# Patient Record
Sex: Female | Born: 1935 | Race: White | Hispanic: No | Smoking: Former smoker
Health system: Southern US, Community
[De-identification: ages and names within clinical notes are randomized; demographics above are authoritative.]

## PROBLEM LIST (undated history)

## (undated) DIAGNOSIS — C801 Malignant (primary) neoplasm, unspecified: Secondary | ICD-10-CM

## (undated) DIAGNOSIS — L409 Psoriasis, unspecified: Secondary | ICD-10-CM

## (undated) DIAGNOSIS — M1990 Unspecified osteoarthritis, unspecified site: Secondary | ICD-10-CM

## (undated) DIAGNOSIS — I10 Essential (primary) hypertension: Secondary | ICD-10-CM

## (undated) DIAGNOSIS — E785 Hyperlipidemia, unspecified: Secondary | ICD-10-CM

## (undated) DIAGNOSIS — T782XXA Anaphylactic shock, unspecified, initial encounter: Secondary | ICD-10-CM

## (undated) DIAGNOSIS — S8290XA Unspecified fracture of unspecified lower leg, initial encounter for closed fracture: Secondary | ICD-10-CM

## (undated) DIAGNOSIS — R55 Syncope and collapse: Secondary | ICD-10-CM

## (undated) DIAGNOSIS — M542 Cervicalgia: Secondary | ICD-10-CM

## (undated) DIAGNOSIS — M533 Sacrococcygeal disorders, not elsewhere classified: Secondary | ICD-10-CM

## (undated) DIAGNOSIS — G8929 Other chronic pain: Secondary | ICD-10-CM

## (undated) DIAGNOSIS — Z0000 Encounter for general adult medical examination without abnormal findings: Secondary | ICD-10-CM

## (undated) DIAGNOSIS — L210 Seborrhea capitis: Secondary | ICD-10-CM

## (undated) DIAGNOSIS — B019 Varicella without complication: Secondary | ICD-10-CM

## (undated) DIAGNOSIS — K56609 Unspecified intestinal obstruction, unspecified as to partial versus complete obstruction: Secondary | ICD-10-CM

## (undated) DIAGNOSIS — N390 Urinary tract infection, site not specified: Secondary | ICD-10-CM

## (undated) DIAGNOSIS — R0609 Other forms of dyspnea: Secondary | ICD-10-CM

## (undated) DIAGNOSIS — I319 Disease of pericardium, unspecified: Secondary | ICD-10-CM

## (undated) DIAGNOSIS — Z8619 Personal history of other infectious and parasitic diseases: Secondary | ICD-10-CM

## (undated) DIAGNOSIS — M549 Dorsalgia, unspecified: Secondary | ICD-10-CM

## (undated) DIAGNOSIS — E871 Hypo-osmolality and hyponatremia: Secondary | ICD-10-CM

## (undated) HISTORY — DX: Urinary tract infection, site not specified: N39.0

## (undated) HISTORY — DX: Encounter for general adult medical examination without abnormal findings: Z00.00

## (undated) HISTORY — DX: Unspecified fracture of unspecified lower leg, initial encounter for closed fracture: S82.90XA

## (undated) HISTORY — DX: Personal history of other infectious and parasitic diseases: Z86.19

## (undated) HISTORY — DX: Unspecified intestinal obstruction, unspecified as to partial versus complete obstruction: K56.609

## (undated) HISTORY — DX: Seborrhea capitis: L21.0

## (undated) HISTORY — DX: Syncope and collapse: R55

## (undated) HISTORY — PX: FEMUR SURGERY: SHX943

## (undated) HISTORY — DX: Hypo-osmolality and hyponatremia: E87.1

## (undated) HISTORY — DX: Cervicalgia: M54.2

## (undated) HISTORY — DX: Disease of pericardium, unspecified: I31.9

## (undated) HISTORY — PX: CATARACT EXTRACTION, BILATERAL: SHX1313

## (undated) HISTORY — DX: Psoriasis, unspecified: L40.9

## (undated) HISTORY — DX: Malignant (primary) neoplasm, unspecified: C80.1

## (undated) HISTORY — DX: Hyperlipidemia, unspecified: E78.5

## (undated) HISTORY — DX: Other forms of dyspnea: R06.09

## (undated) HISTORY — DX: Varicella without complication: B01.9

## (undated) HISTORY — PX: TUBAL LIGATION: SHX77

## (undated) HISTORY — DX: Anaphylactic shock, unspecified, initial encounter: T78.2XXA

## (undated) HISTORY — DX: Unspecified osteoarthritis, unspecified site: M19.90

## (undated) HISTORY — DX: Dorsalgia, unspecified: M54.9

## (undated) HISTORY — DX: Other chronic pain: G89.29

## (undated) HISTORY — DX: Essential (primary) hypertension: I10

## (undated) HISTORY — DX: Sacrococcygeal disorders, not elsewhere classified: M53.3

---
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
## 1977-02-04 HISTORY — PX: OTHER SURGICAL HISTORY: SHX169

## 1988-02-05 DIAGNOSIS — T782XXA Anaphylactic shock, unspecified, initial encounter: Secondary | ICD-10-CM

## 1988-02-05 HISTORY — PX: TOTAL KNEE ARTHROPLASTY: SHX125

## 1988-02-05 HISTORY — DX: Anaphylactic shock, unspecified, initial encounter: T78.2XXA

## 1994-02-04 DIAGNOSIS — S8290XA Unspecified fracture of unspecified lower leg, initial encounter for closed fracture: Secondary | ICD-10-CM

## 1994-02-04 HISTORY — DX: Unspecified fracture of unspecified lower leg, initial encounter for closed fracture: S82.90XA

## 1994-02-04 LAB — HM PAP SMEAR

## 2000-10-23 LAB — BASIC METABOLIC PANEL
Glucose: 93 mg/dL
Sodium: 139 mmol/L (ref 137–147)

## 2002-04-27 ENCOUNTER — Encounter: Payer: Self-pay | Admitting: Emergency Medicine

## 2002-04-27 ENCOUNTER — Inpatient Hospital Stay (HOSPITAL_COMMUNITY): Admission: EM | Admit: 2002-04-27 | Discharge: 2002-05-05 | Payer: Self-pay | Admitting: Diagnostic Radiology

## 2002-04-27 ENCOUNTER — Encounter: Payer: Self-pay | Admitting: Orthopedic Surgery

## 2002-04-29 ENCOUNTER — Encounter: Payer: Self-pay | Admitting: Orthopedic Surgery

## 2002-05-01 ENCOUNTER — Encounter: Payer: Self-pay | Admitting: Orthopedic Surgery

## 2002-05-02 ENCOUNTER — Encounter: Payer: Self-pay | Admitting: Orthopedic Surgery

## 2002-05-05 ENCOUNTER — Inpatient Hospital Stay (HOSPITAL_COMMUNITY)
Admission: RE | Admit: 2002-05-05 | Discharge: 2002-05-14 | Payer: Self-pay | Admitting: Physical Medicine & Rehabilitation

## 2002-05-06 ENCOUNTER — Encounter: Payer: Self-pay | Admitting: Physical Medicine & Rehabilitation

## 2007-09-01 ENCOUNTER — Encounter (INDEPENDENT_AMBULATORY_CARE_PROVIDER_SITE_OTHER): Payer: Self-pay | Admitting: General Surgery

## 2007-09-01 ENCOUNTER — Ambulatory Visit (HOSPITAL_COMMUNITY): Admission: RE | Admit: 2007-09-01 | Discharge: 2007-09-01 | Payer: Self-pay | Admitting: General Surgery

## 2007-11-09 LAB — BASIC METABOLIC PANEL
BUN: 10 mg/dL (ref 4–21)
Creatinine: 1 mg/dL (ref 0.5–1.1)
Glucose: 88 mg/dL
Sodium: 141 mmol/L (ref 137–147)

## 2009-06-01 LAB — BASIC METABOLIC PANEL
BUN: 11 mg/dL (ref 4–21)
Creatinine: 1 mg/dL (ref 0.5–1.1)
Potassium: 4.7 mmol/L (ref 3.4–5.3)

## 2009-06-01 LAB — HEPATIC FUNCTION PANEL
ALT: 18 U/L (ref 7–35)
Bilirubin, Total: 1.1 mg/dL

## 2009-06-01 LAB — TSH: TSH: 0.94 u[IU]/mL (ref 0.41–5.90)

## 2009-06-01 LAB — LIPID PANEL: Triglycerides: 103 mg/dL (ref 40–160)

## 2009-06-18 ENCOUNTER — Inpatient Hospital Stay (HOSPITAL_COMMUNITY)
Admission: EM | Admit: 2009-06-18 | Discharge: 2009-06-20 | Payer: Self-pay | Source: Home / Self Care | Admitting: Emergency Medicine

## 2009-06-19 ENCOUNTER — Encounter (INDEPENDENT_AMBULATORY_CARE_PROVIDER_SITE_OTHER): Payer: Self-pay | Admitting: Internal Medicine

## 2009-06-19 ENCOUNTER — Ambulatory Visit: Payer: Self-pay | Admitting: Vascular Surgery

## 2009-07-17 ENCOUNTER — Encounter (INDEPENDENT_AMBULATORY_CARE_PROVIDER_SITE_OTHER): Payer: Self-pay | Admitting: Internal Medicine

## 2010-04-23 LAB — LIPID PANEL
LDL Cholesterol: 125 mg/dL — ABNORMAL HIGH (ref 0–99)
VLDL: 20 mg/dL (ref 0–40)

## 2010-04-23 LAB — DIFFERENTIAL
Basophils Absolute: 0 10*3/uL (ref 0.0–0.1)
Eosinophils Absolute: 0 10*3/uL (ref 0.0–0.7)
Eosinophils Relative: 0 % (ref 0–5)
Lymphocytes Relative: 11 % — ABNORMAL LOW (ref 12–46)
Monocytes Absolute: 0.5 10*3/uL (ref 0.1–1.0)
Monocytes Relative: 8 % (ref 3–12)

## 2010-04-23 LAB — CBC
HCT: 38.2 % (ref 36.0–46.0)
HCT: 39.9 % (ref 36.0–46.0)
Hemoglobin: 13 g/dL (ref 12.0–15.0)
Hemoglobin: 14 g/dL (ref 12.0–15.0)
MCHC: 35 g/dL (ref 30.0–36.0)
Platelets: 282 10*3/uL (ref 150–400)
Platelets: 283 10*3/uL (ref 150–400)
RBC: 4.44 MIL/uL (ref 3.87–5.11)
RDW: 13.1 % (ref 11.5–15.5)
WBC: 5.2 10*3/uL (ref 4.0–10.5)

## 2010-04-23 LAB — CARDIAC PANEL(CRET KIN+CKTOT+MB+TROPI)
CK, MB: 1.7 ng/mL (ref 0.3–4.0)
Relative Index: INVALID (ref 0.0–2.5)
Relative Index: INVALID (ref 0.0–2.5)
Total CK: 57 U/L (ref 7–177)
Total CK: 62 U/L (ref 7–177)

## 2010-04-23 LAB — URINALYSIS, ROUTINE W REFLEX MICROSCOPIC
Glucose, UA: NEGATIVE mg/dL
Ketones, ur: NEGATIVE mg/dL
Protein, ur: NEGATIVE mg/dL
Specific Gravity, Urine: 1.009 (ref 1.005–1.030)
Urobilinogen, UA: 0.2 mg/dL (ref 0.0–1.0)

## 2010-04-23 LAB — BASIC METABOLIC PANEL
BUN: 12 mg/dL (ref 6–23)
CO2: 25 mEq/L (ref 19–32)
Calcium: 9.1 mg/dL (ref 8.4–10.5)
Calcium: 9.3 mg/dL (ref 8.4–10.5)
Chloride: 101 mEq/L (ref 96–112)
Chloride: 103 mEq/L (ref 96–112)
Creatinine, Ser: 0.8 mg/dL (ref 0.4–1.2)
GFR calc non Af Amer: >60 mL/min (ref >60)
GFR calc non Af Amer: >60 mL/min (ref >60)
Glucose, Bld: 105 mg/dL — ABNORMAL HIGH (ref 70–99)
Glucose, Bld: 99 mg/dL (ref 70–99)
Potassium: 4.5 mEq/L (ref 3.5–5.1)
Sodium: 137 mEq/L (ref 135–145)

## 2010-04-23 LAB — URINE MICROSCOPIC-ADD ON

## 2010-04-23 LAB — CK TOTAL AND CKMB (NOT AT ARMC)
CK, MB: 2.3 ng/mL (ref 0.3–4.0)
Relative Index: INVALID (ref 0.0–2.5)
Total CK: 63 U/L (ref 7–177)

## 2010-04-23 LAB — POCT CARDIAC MARKERS: Troponin i, poc: <0.05 ng/mL (ref 0.00–0.09)

## 2010-04-23 LAB — GLUCOSE, CAPILLARY: Glucose-Capillary: 96 mg/dL (ref 70–99)

## 2010-04-23 LAB — HEPATIC FUNCTION PANEL: AST: 24 U/L (ref 0–37)

## 2010-06-19 NOTE — Op Note (Signed)
NAME:  [name], [name]                   ACCOUNT NO.:  [id number]   MEDICAL RECORD NO.:  [id number]          PATIENT TYPE:  AMB   LOCATION:  SDS                          FACILITY:  [facility]   PHYSICIAN:  Ollen Gross. Vernell Morgans, M.D. DATE OF BIRTH:  [birth year]   DATE OF PROCEDURE:  09/01/2007  DATE OF DISCHARGE:  09/01/2007                               OPERATIVE REPORT   PREOPERATIVE DIAGNOSIS:  Umbilical hernia.   POSTOPERATIVE DIAGNOSIS:  Umbilical hernia.   PROCEDURE:  Umbilical hernia repair with mesh.   SURGEON:  Ollen Gross. Vernell Morgans, MD   ANESTHESIA:  General via LMA.   PROCEDURE:  After informed consent was obtained, the patient was brought  to the operating room, placed in the supine position on the operating  room table.  After adequate induction of general anesthesia, the  patient's abdomen was prepped with Betadine and draped in usual sterile  manner.  A transverse incision was made along the inferior edge of the  umbilicus with a #15 blade knife.  This incision was carried down  through the skin and subcutaneous tissue sharply with electrocautery  until the hernia was identified.  The hernia sac was opened sharply with  the electrocautery.  Only some omental fat was in the hernia sac and  this was able to easily be reduced.  The hernia sac was then ligated at  its base with the electrocautery.  Some circumferential dissection  around the fascial defect along the interface between the fascia and the  subcutaneous tissue was performed sharply with the electrocautery to  create a space for mesh.  The fascial defect was between a centimeter  and centimeter half in diameter.  The fascial defect was closed with  interrupted #1 Novafil  stitches.  A small piece of 3 x 6 mesh was then  chosen and the small piece of this was cut to cover the repair.  The  mesh was then anchored around and overlying the defect with interrupted  #1 Novafil stitches.  Once this was accomplished, the mesh was in  good  position without any tension.  The hernia was repaired and well covered  with the mesh.  Prior to starting the case, the area was completely  infiltrated with 0.25% Marcaine.  The wound was now irrigated with  copious amounts of saline.  The subcutaneous tissue was then closed over  the mesh with interrupted 3-0 Vicryl stitches, and the skin was closed  with running 4-0 Monocryl subcuticular stitch.  Dermabond dressing was  applied.  Once the Dermabond was dry, cotton balls and 4 x 4 gauze,  compression dressing were then applied.  The patient tolerated the  procedure well.  At the end of the case, all needle, sponge, and  instruments counts were correct.  The patient was then awakened and  taken to the recovery room in stable condition.      Ollen Gross. Vernell Morgans, M.D.  Electronically Signed     PST/MEDQ  D:  09/01/2007  T:  09/01/2007  Job:  [id number]

## 2010-06-22 NOTE — Op Note (Signed)
NAME:  [name], [name]                               ACCOUNT NO.:  [id number]   MEDICAL RECORD NO.:  [id number]                   PATIENT TYPE:  INP   LOCATION:  0474                                 FACILITY:  [facility]   PHYSICIAN:  Harvie Junior, M.D.                DATE OF BIRTH:  [birth year]   DATE OF PROCEDURE:  04/27/2002  DATE OF DISCHARGE:                                 OPERATIVE REPORT   PREOPERATIVE DIAGNOSIS:  Intertrochanteric fracture, left hip.   POSTOPERATIVE DIAGNOSIS:  Intertrochanteric fracture, left hip.   PROCEDURE:  Open reduction internal fixation of left intertrochanteric hip  fracture.   SURGEON:  Harvie Junior, M.D.   ASSISTANT:  Marshia Ly, P.A.   ANESTHESIA:  General.   BRIEF HISTORY:  She is a 77 year old female with long history of having a  fall.  She ultimately was seen in the emergency room by Devoria Albe, M.D. and  we were consulted for treatment of a left intertrochanteric hip fracture.  Was shown at that point to be a three part intertrochanteric hip fracture.  We talked about treatment options and ultimately felt that open reduction  internal fixation was the most appropriate course of action.  She was  brought to the operating room for this procedure.   PROCEDURE:  The patient was brought to the operating room.  After adequate  level of general anesthesia was obtained,  __________  the patient  positioned on the operating table.  Left hip was then prepped and draped in  the usual sterile fashion.  Following this, routine prep and drape of the  left hip was undertaken.  A lateral incision was made for lateral approach  to the hip.  Subcutaneous incision was taken down to the level of the tensor  fascia which was clearly divided in line with its fibers.  Attention was  then turned to the vastus lateralis which was divided in line with its  fibers.  First the vastus lateralis muscle tissue was then divided off the  posterior aspect of the vastus  lateralis fascia to the linea aspera.  The  muscle was then retracted anteriorly and the lateral aspect of the femur and  the fracture site were identified.  The guidewire was then advanced into the  central portion of the head on both the AP and lateral imaging and this was  done under direct fluoroscopic imaging.  Following this, this was overreamed  and a 95-mm screw was put in place into the central portion of the head on  both the AP and lateral fluoroscopy.  A four-hole, 135-degree sideplate was  then used and attached to the lateral aspect of the femur in standard AO  fashion.  Excellent fixation was achieved with all four screws.  Following  this, the fluoroscopy was used in AP and lateral to make sure that the  alignment positioning  was appropriate and once this was checked under  fluoroscopy, the wound was copiously irrigated and suctioned dry.  A set  screw was put in, the fracture compressed, and the set screw was removed.  Copious irrigation was undertaken.  The vastus lateralis was closed with 0  Vicryl interrupted suture.  The tensor fascia was closed with 1 Vicryl  interrupted suture.  The skin was then closed with 0 and 2-0 Vicryl and the  skin stapled.  Sterile __________  dressing was applied and the patient  taken to the recovery room.  She was noted to be in satisfactory condition.  Estimated blood loss for the procedure was 600 mL.                                              Harvie Junior, M.D.   Ranae Plumber  D:  04/27/2002  T:  04/27/2002  Job:  [id number]

## 2010-06-22 NOTE — Consult Note (Signed)
[name], [name]                               ACCOUNT NO.:  [id number]   MEDICAL RECORD NO.:  [id number]                   PATIENT TYPE:  INP   LOCATION:  0474                                 FACILITY:  [facility]   PHYSICIAN:  Petra Kuba, M.D.                 DATE OF BIRTH:  09/14/1924   DATE OF CONSULTATION:  05/01/2002  DATE OF DISCHARGE:                                   CONSULTATION   REPORT TITLE:  GASTROENTEROLOGY CONSULTATION.   REFERRING PHYSICIAN:  Harvie Junior, M.D.   HISTORY OF PRESENT ILLNESS:  The patient was seen at the request of Dr.  Luiz Blare for a postoperative ileus.  She really has not had much GI problems  before and no GI workup.  She has not had any previous colonic screening  who, after having her left intertrochanteric hip fracture repaired, has not  had a bowel movement.  She has had some increased nausea and pain and some  increased abdominal distention.  X-ray confirmed the ileus.  Her potassium  was 3 today.   PAST MEDICAL HISTORY:  1. Pertinent for some mild high blood pressure.  2. She has had an appendectomy.  3. Tubal ligation.  4. C-section.  5. She has also had some orthopedic surgeries.  6. No other GI problems.   ALLERGIES:  1. IBUPROFEN.  2. CODEINE.  3. VOLTAREN.   HOME MEDICATIONS:  1. Tylenol.  2. HCTZ.   MEDICATIONS IN HOSPITAL:  1. Coumadin.  2. HCTZ.  3. Percocet.  4. Darvocet.  5. Robaxin.  6. Antacids.  7. Dulcolax.  8. Fleets.  9. Phenergan.   SOCIAL HISTORY:  She does not smoke or drink.  Minimizes aspirin and  nonsteroidals.   FAMILY HISTORY:  Pertinent for some irritable bowel, but no obvious other GI  problems.   REVIEW OF SYSTEMS:  Pertinent that an enema and suppository really have not  been helpful and she does not like the NG tube.  She had been very active  prior to her injury.   PHYSICAL EXAMINATION:  GENERAL:  No acute distress, although clearly  unhappy.  ABDOMEN:  Pertinent for being minimally  tender, slightly distended, but some  occasional bowel sounds.  X-ray was reviewed pertinent for the ileus.   LABORATORY DATA:  Pertinent for normal white count.  BUN, creatinine,  magnesium, and calcium positive.  Trace __________.  Decreased potassium as  mentioned above.   ASSESSMENT:  Postoperative ileus and decreased potassium.   PLAN:  Will give her three rounds of potassium and increase the potassium in  her IV fluids.  Will try to decrease the pain medicine using more Darvocet  than Percocet and will try some Magnesium citrate via her NG tube and have  it planned for a few hours to see if that will help and also begin IV  Reglan.  Will  follow with you and check on tomorrow, checking potassium  tomorrow and see if she is not better.  At the first signs of worsening,  probably will repeat x-ray.  May also try more enemas and suppositories in  the future and possibly something like Zelnorm.                                               Petra Kuba, M.D.    MEM/MEDQ  D:  05/01/2002  T:  05/01/2002  Job:  [id number]   cc:   Harvie Junior, M.D.  [address]  [place]  [place] [address]  Fax: [phone]   Gretta Arab. Valentina Lucks, M.D.  [address] [place]  [place] [address]  Fax: [phone]

## 2010-06-22 NOTE — Discharge Summary (Signed)
[name], [name]                               ACCOUNT NO.:  [id number]   MEDICAL RECORD NO.:  [id number]                   PATIENT TYPE:  INP   LOCATION:  0474                                 FACILITY:  [facility]   PHYSICIAN:  Harvie Junior, M.D.                DATE OF BIRTH:  October 21, 1924   DATE OF ADMISSION:  04/27/2002  DATE OF DISCHARGE:  05/05/2002                                 DISCHARGE SUMMARY   ADMISSION DIAGNOSES:  1. Displaced intertrochanteric fracture left hip.  2. Hypertension.  3. Status post bilateral total knee replacements.   DISCHARGE DIAGNOSES:  1. Displaced intertrochanteric fracture left hip.  2. Hypertension.  3. Status post bilateral total knee replacements.  4. Hypokalemia.  5. Postoperative ileus.   CONSULTATIONS IN THE HOSPITAL:  Dr. Petra Kuba, gastroenterology.   PROCEDURES IN THE HOSPITAL:  Open reduction and internal fixation left hip,  Jodi Geralds, M.D., April 27, 2002.   BRIEF HISTORY:  The patient is a 77 year old female who fell at home as she  was going out her outdoor steps.  She had onset of left hip pain, inability  to weight bear.  Her left leg was shortened, and her foot was externally  rotated.  She was brought to [facility] Emergency Room via EMS.  Her x-rays showed a displaced left subtrochanteric/intertrochanteric  fracture.  She was admitted for surgical treatment of this significant left  hip injury.   PERTINENT LABORATORY STUDIES:  Hemoglobin on admission was 14.2.  On  postoperative evening of day of surgery her hemoglobin was 12.1.  On  postoperative day #1 her hemoglobin was 10.8, #2 was 10.3, #3 was 10, #4 was  11.7.  She was followed until the date of discharge on May 05, 2002, when  her hemoglobin was 9.5.  Indices were within normal limits on the date of  admission.  Protime on admission was 12.1 seconds, with an INR of 0.8, PTT  was 29.  She was on Coumadin therapy and was noted to have an elevated  protime on the date of discharge of 32.8 with an INR of 4.2.  The CMET on  admission was within normal limits other than elevated glucose of 119.  Her  potassium was 4.  On postoperative day #4 the potassium was 3.  This was  done the following day and was 4.5 and on the day of discharge was 3.3.  A  urinalysis on admission showed no abnormalities.   EKG on admission showed normal sinus rhythm with first-degree AV block with  occasional premature supraventricular complexes.   Preoperative x-rays of the left hip showed an acute intertrochanteric  fracture of the left hip.  There was no evidence of pelvic fracture.  Chest  x-ray showed mild cardiomegaly with no active disease.  Postoperative x-ray  of the left hip showed status post ORIF left hip  with placement of dynamic  hip screw and lateral femoral plate across the comminuted intertrochanteric  fracture.  No hardware complications were noted.  X-rays of the abdomen on  May 01, 2002, showed findings most consistent with ileus.  X-ray on May 02, 2002, of the abdomen showed interval placement of a nasogastric tube  with persistent changes of ileus.   HOSPITAL COURSE:  The patient was admitted to [facility] through the  emergency room and was brought to the operating room where she underwent  ORIF of her left hip by Dr. Luiz Blare.  Postoperatively she was placed on  Coumadin antithrombolytic therapy per pharmacy protocol.  She was put on  Ancef 1 g IV q.8h. x6 doses.  Physical therapy was ordered for walker  ambulation, 50% weightbearing on the left.  On postoperative day #1 she was  doing well.  She had some nervousness.  She initially was taking fluids  without difficulty.  Her Foley catheter which had been placed at the time of  surgery was drainage well.  Her potassium was 4.  Hemoglobin was 10.8.  INR  was 1.  She was gotten out of bed to the chair with physical therapy,  partial weightbearing on the left side.  On postoperative  day #2 she had  some pain and the sensation of bones moving in her left hip.  Repeat x-  rays were taken which were unremarkable and showed excellent fixation of her  left hip fracture.  A rehabilitation consult was obtained.  She was felt to  be an excellent candidate for inpatient rehabilitation.  On postoperative  day #3 she complained of gassiness in her stomach.  She was taking fluids  without difficulty but was unable to take solids.  She made slow progress  with physical therapy.  She had not had flatus or a bowel movement at that  point.  Hemoglobin was 10.  INR was 1.3.  She did have hypoactive bowel  sounds.  A Dulcolax suppository was ordered and her Foley catheter  discontinued, and her diet was decreased to clear liquids.  She had  worsening abdominal pain and cramping and distention with some nausea.  A  KUB x-ray confirmed a postoperative ileus.  Her pain medications were  decreased, and a GI consult was obtained by Vida Rigger, M.D.  He suggested  an enema, decreasing her IV fluids, and increasing her Reglan.  She had  significant abdominal pain and distention.  She was kept n.p.o.  She was out  of bed, ambulating with a walker.  Her vital signs were stable.  She was  afebrile.  Her left hip dressing was changed, and her hip wound was benign.  NV was intact to her left lower extremity.  Her potassium was 4.1.  Hemoglobin was 10.6.  Her INR was 4.  Her Coumadin was held as this was  managed per the hospital pharmacy.  She was then beginning to pass gas on  May 03, 2002, postoperative day #6.  Her NG tube was discontinued.  MiraLax was continued per Dr. Ewing Schlein.  She was felt to be an excellent  candidate for inpatient rehabilitation.  Her NG tube was removed.  She was  then passing gas without difficulty, had no more nausea and vomiting.  Her  bowel sounds were stable, and she was afebrile.  A clear-liquid diet was started.  She did have a bowel movement on postoperative day  #7.  She had  much less abdominal pain.  Her INR was 4.3, and her protime was 33.2  seconds.  Her Coumadin continued to be held.  She was felt to then be an  excellent candidate for the inpatient rehabilitation floor at [facility].  She was transferred there on May 05, 2002, in improved condition.   DIET ON DISCHARGE:  Clear-liquid diet, progressing to a soft mechanical  diet, and then regular as tolerated.   MEDICATIONS ON DISCHARGE:  1. Hydrochlorothiazide.  2. Coumadin per pharmacy protocol.  3. Darvocet-N 100 p.r.n. for pain.    ACTIVITY STATUS:  Weightbearing, 50% on the left with a walker, with  physical therapy.   FOLLOW-UP:  We will follow her while she is on the inpatient unit at [facility].     Marshia Ly, P.A.                       Harvie Junior, M.D.    Cordelia Pen  D:  06/11/2002  T:  06/12/2002  Job:  [id number]   cc:   Gretta Arab. Valentina Lucks, M.D.  [address] [place]  [place] [address]  Fax: [phone]   Petra Kuba, M.D.  [address]  [place]  [place] [address]  Fax: [phone]

## 2010-06-22 NOTE — Discharge Summary (Signed)
[name], [name] NO.:  [id number]   MEDICAL RECORD NO.:  [id number]                   PATIENT TYPE:  IPS   LOCATION:  4149                                 FACILITY:  [facility]   PHYSICIAN:  Ranelle Oyster, M.D.             DATE OF BIRTH:  1924-05-08   DATE OF ADMISSION:  05/05/2002  DATE OF DISCHARGE:  05/14/2002                                 DISCHARGE SUMMARY   DISCHARGE DIAGNOSES:  1. Left intertrochanteric hip fracture.  2. Ileus, resolved.  3. History of hypertension.  4. Urinary tract infection.   HISTORY OF PRESENT ILLNESS:  The patient is a 77 year old white female  admitted at [facility] ER on April 27, 2002 for evaluation of left hip pain  after a fall.  X-ray revealed IT fracture of the left hip, no pelvic  fracture.  The patient underwent ORIF of the left hip by Dr. Harvie Junior.  The patient is presently on Coumadin for DVT prophylaxis.  Postoperative  complications include ileus and anemia.  The patient has been followed by  GI, Dr. Petra Kuba.  PT report at this time indicated that the patient is  ambulating with close supervision, 50 feet with rolling walker and min-  assist level for obtaining transfers and bed mobility.  The patient was  transferred to [facility] Department on May 05, 2002.   PAST MEDICAL HISTORY:  Past medical history is significant for hypertension,  pericarditis, chronic bronchitis.  Denies diabetes, DVT, CVA or  cardiovascular disease.   PAST SURGICAL HISTORY:  Past surgical history is significant for bilateral  TKRs in 1990, appendectomy, back surgery, tubal ligation.   MEDICATIONS PRIOR TO ADMISSION:  1. Tylenol.  2. Hydrochlorothiazide 25 mg daily.  3. Centrum Silver.   PRIMARY CARE Mariaguadalupe Fialkowski:  Dr. Consuella Lose C. Griffin.   SOCIAL HISTORY:  The patient lives at home in a one-level home in  [place] with three to four steps at entry.  She was independent prior to  admission.  She quit smoking five years ago.  She plans to live with son,  who is unemployed, after discharge and son is able to provide assistance.   REVIEW OF SYSTEMS:  Denies any chest pain, shortness of breath, or nausea or  vomiting.   ALLERGIES:  IBUPROFEN.   HOSPITAL COURSE:  The patient was admitted to [facility] Rehab  Department on May 05, 2002 for comprehensive inpatient rehabilitation  where she received more than three hours of physical therapy daily.  Overall, she made fairly good progress during her hospital stay.  Hospital  course was significant for elevated INR, anemia and UTI.  The patient was  able to maintain her partial weightbearing status and able to tolerate  therapies.  She was discharged from rehab at a modified independent level,  ambulating approximately 150 feet.  She remained on  Coumadin for most of her  stay until INR was greater than 3.  On May 05, 2002, Coumadin was held x3  days due to INR greater than 3.  INR was probably elevated due to the  patient's nothing-by-mouth status secondary to ileus.  The patient began to  take foods in slowly and had no problems.  Ileus did resolve.  A urine  culture was performed on May 10, 2002 which revealed greater than 100,000  colonies of a group B beta strep, therefore, she was started on amoxicillin  250 mg p.o. b.i.d. for seven days.  Staples were discontinued from the  surgical incision on May 10, 2002 and Steri-Strips were applied.  Hip  incision appeared to be well-healed with no signs of infection.  As noted,  on May 06, 2002, her diet was advanced to a regular diet and able to  tolerate very well.  She remained on Trinsicon one tablet p.o. b.i.d. for  hemoglobin; she had an admission hemoglobin of 9.8.  There were no other  major issues that occurred while in rehab.   Latest labs reveal hemoglobin of 9.8, hematocrit 29.5, white blood cell  count was 4.4, platelet count 436,000; latest INR  was 2.6 and latest PT was  24.5; latest sodium 136, potassium 3.6, chloride 100, CO2 28, glucose 106,  BUN 10, creatinine 1, AST 51, ALT 52.  She did have repeat _______ on  May 06, 2002 and it still demonstrated it was consistent with a  nonobstructive ileus and the patient was asymptomatic.  At the time of  discharge, all vitals were fairly stable.  PT report indicated the patient  was ambulating approximately modified independently 150 feet with rolling  walker, could transfer sit to stand, modified independently, bed mobility,  modified independently.  She could perform most ADLs with supervision,  modified independently.  She is at partial weightbearing status.  The  patient was discharged home with her family.   DISCHARGE MEDICATIONS:  1. Hydrochlorothiazide 25 mg daily.  2. Coumadin 2.5 mg one tablet in the p.m. until May 28, 2002.  3. Multivitamin one tablet daily.  4. Oxycodone 5 to 10 mg every four to six hours as needed for pain.  5. Amoxicillin 250 mg one tablet three times a day for seven days.  6. She takes calcium plus D over the counter once daily.   SPECIAL DISCHARGE INSTRUCTIONS:  No aspirin, ibuprofen or Aleve while on  Coumadin.   PAIN MANAGEMENT:  Pain management includes oxycodone and Tylenol.   ACTIVITY:  No driving or drinking alcohol.  Use walker.  She will have  [facility] for PT, OT and INR, and to be monitored by R.N.; first  draw will be Monday for PT and INR.    FOLLOWUP:  Follow up with Dr. Luiz Blare in two weeks; call for appointment.  Follow up with her primary care Nicholes Hibler within four to six weeks.  Follow  up with Dr. Ranelle Oyster as needed.     Junie Bame, P.A.                       Ranelle Oyster, M.D.    LH/MEDQ  D:  06/17/2002  T:  06/19/2002  Job:  [id number]   cc:   Gretta Arab. Valentina Lucks, M.D.  [address]  [place] [address]  Fax: [phone]   Harvie Junior, M.D.  [address]  [place]  [place] [address]  Fax: [phone]

## 2010-11-02 LAB — CBC
HCT: 44.3
Hemoglobin: 14.9
MCHC: 33.5
RBC: 4.86
RDW: 12.9
WBC: 5.8

## 2010-11-02 LAB — COMPREHENSIVE METABOLIC PANEL
AST: 30
Albumin: 4.1
GFR calc Af Amer: 60 — ABNORMAL LOW
Glucose, Bld: 90
Potassium: 4.1
Total Bilirubin: 1

## 2010-11-02 LAB — DIFFERENTIAL
Basophils Absolute: 0
Eosinophils Relative: 2
Lymphocytes Relative: 18
Lymphs Abs: 1
Monocytes Absolute: 0.5
Neutro Abs: 4.2

## 2011-05-16 ENCOUNTER — Encounter: Payer: Self-pay | Admitting: Family Medicine

## 2011-05-16 ENCOUNTER — Ambulatory Visit (INDEPENDENT_AMBULATORY_CARE_PROVIDER_SITE_OTHER): Payer: Medicare Other | Admitting: Family Medicine

## 2011-05-16 VITALS — BP 147/85 | HR 75 | Temp 98.1°F | Ht 65.5 in | Wt 189.0 lb

## 2011-05-16 DIAGNOSIS — R350 Frequency of micturition: Secondary | ICD-10-CM

## 2011-05-16 DIAGNOSIS — R5383 Other fatigue: Secondary | ICD-10-CM

## 2011-05-16 DIAGNOSIS — R0609 Other forms of dyspnea: Secondary | ICD-10-CM

## 2011-05-16 DIAGNOSIS — G8929 Other chronic pain: Secondary | ICD-10-CM | POA: Insufficient documentation

## 2011-05-16 DIAGNOSIS — L821 Other seborrheic keratosis: Secondary | ICD-10-CM | POA: Insufficient documentation

## 2011-05-16 DIAGNOSIS — L409 Psoriasis, unspecified: Secondary | ICD-10-CM | POA: Insufficient documentation

## 2011-05-16 DIAGNOSIS — M549 Dorsalgia, unspecified: Secondary | ICD-10-CM

## 2011-05-16 DIAGNOSIS — L719 Rosacea, unspecified: Secondary | ICD-10-CM

## 2011-05-16 DIAGNOSIS — I10 Essential (primary) hypertension: Secondary | ICD-10-CM | POA: Insufficient documentation

## 2011-05-16 DIAGNOSIS — R5381 Other malaise: Secondary | ICD-10-CM

## 2011-05-16 DIAGNOSIS — R0600 Dyspnea, unspecified: Secondary | ICD-10-CM

## 2011-05-16 DIAGNOSIS — M1990 Unspecified osteoarthritis, unspecified site: Secondary | ICD-10-CM | POA: Insufficient documentation

## 2011-05-16 DIAGNOSIS — L210 Seborrhea capitis: Secondary | ICD-10-CM | POA: Insufficient documentation

## 2011-05-16 DIAGNOSIS — R0989 Other specified symptoms and signs involving the circulatory and respiratory systems: Secondary | ICD-10-CM

## 2011-05-16 DIAGNOSIS — Z8619 Personal history of other infectious and parasitic diseases: Secondary | ICD-10-CM | POA: Insufficient documentation

## 2011-05-16 DIAGNOSIS — Z0000 Encounter for general adult medical examination without abnormal findings: Secondary | ICD-10-CM

## 2011-05-16 DIAGNOSIS — M542 Cervicalgia: Secondary | ICD-10-CM | POA: Insufficient documentation

## 2011-05-16 DIAGNOSIS — Z23 Encounter for immunization: Secondary | ICD-10-CM

## 2011-05-16 DIAGNOSIS — I319 Disease of pericardium, unspecified: Secondary | ICD-10-CM

## 2011-05-16 HISTORY — DX: Dyspnea, unspecified: R06.00

## 2011-05-16 HISTORY — DX: Dorsalgia, unspecified: M54.9

## 2011-05-16 HISTORY — DX: Other forms of dyspnea: R06.09

## 2011-05-16 HISTORY — DX: Encounter for general adult medical examination without abnormal findings: Z00.00

## 2011-05-16 LAB — POCT URINALYSIS DIPSTICK
Bilirubin, UA: NEGATIVE
Blood, UA: NEGATIVE
Glucose, UA: NEGATIVE
Nitrite, UA: NEGATIVE
Spec Grav, UA: 1.015
Urobilinogen, UA: 0.2

## 2011-05-16 LAB — HEPATIC FUNCTION PANEL
Alkaline Phosphatase: 55 U/L (ref 39–117)
Bilirubin, Direct: 0.1 mg/dL (ref 0.0–0.3)
Total Bilirubin: 0.6 mg/dL (ref 0.3–1.2)

## 2011-05-16 LAB — RENAL FUNCTION PANEL
Albumin: 4.4 g/dL (ref 3.5–5.2)
Chloride: 107 mEq/L (ref 96–112)
GFR: 54.21 mL/min — ABNORMAL LOW (ref >60.00)
Glucose, Bld: 99 mg/dL (ref 70–99)
Phosphorus: 3.2 mg/dL (ref 2.3–4.6)
Potassium: 4.6 mEq/L (ref 3.5–5.1)
Sodium: 142 mEq/L (ref 135–145)

## 2011-05-16 LAB — CBC
HCT: 44.3 % (ref 36.0–46.0)
Hemoglobin: 14.4 g/dL (ref 12.0–15.0)
MCV: 91.8 fl (ref 78.0–100.0)
Platelets: 239 10*3/uL (ref 150.0–400.0)
RBC: 4.83 Mil/uL (ref 3.87–5.11)
WBC: 7 10*3/uL (ref 4.5–10.5)

## 2011-05-16 MED ORDER — DOXYCYCLINE HYCLATE 50 MG PO CAPS: 50.0000 mg | ORAL_CAPSULE | ORAL | Status: DC

## 2011-05-16 MED ORDER — AMLODIPINE BESYLATE 5 MG PO TABS: 5.0000 mg | ORAL_TABLET | Freq: Every day | ORAL | Status: AC

## 2011-05-16 NOTE — Progress Notes (Signed)
Patient ID: [name], female   DOB: February 08, 1924, 78 y.o.   MRN: [id number] [name] [id number] 1924-07-16 05/16/2011      Progress Note New Patient  Subjective  Chief Complaint  Chief Complaint  Patient presents with  . Establish Care    new patient    HPI  Patient is an 78 year old Caucasian female who is in today for an new patient appointment accompanied by her son. He lives with her aunt is her personal caretaker. Overall she does well. She complains of mild memory loss but is drinking every day and realizes is minimal. She's been in recent good health. They're concerned about some symptoms began about 6 months ago. He used to walk daily and then about 6 months ago while she was out walking for a sudden onset of some back pain dyspnea and fatigue and has refused while walking with her son ever since. She now intermittently has some mid back pain but it is not always associated with dyspnea or with exertion. Sometimes changing position to resolve it. When she exerts herself she does note she's had increased dyspnea and increased fatigability. They deny any acute illness, fever, congestion, palpitations, GI or GU complaints. They're making this a try practice secondary to the convenience of being closer to home  Past Medical History  Diagnosis Date  . Psoriasis     scalp  . Cancer     basal cells on back, shoulder and nose  . Chicken pox as a child  . Mumps as a child  . Measles as a child  . Hypertension   . Fainting spell     X 1 time, at home in hospital 3 days  . Broken leg 1996  . Arthritis     cervical, spurs  . Neck pain, chronic   . History of shingles   . Seborrhea capitis   . Seborrheic keratosis   . Pericarditis   . Back pain 05/16/2011  . DOE (dyspnea on exertion) 05/16/2011  . Preventative health care 05/16/2011    Past Surgical History  Procedure Date  . [place]acarditice 1981    [place]  . Back disc repair 1979    low back with LLE radiculopathy  . Double  knee replacement 1990  . Anaphalactic shock 1990    due to drug Voltaren  . Breast lumpectomy 1947    left  . Cataract extraction, bilateral   . Tubal ligation     Family History  Problem Relation Age of Onset  . Cancer Mother     ovarian  . Cancer Father     pancreatic  . Heart disease Son   . Diabetes Son     type 2  . COPD Son     smoked  . Emphysema Son     History   Social History  . Marital Status: Widowed    Spouse Name: N/A    Number of Children: N/A  . Years of Education: N/A   Occupational History  . Not on file.   Social History Main Topics  . Smoking status: Former Smoker -- 41 years    Types: Cigarettes    Quit date: 02/05/1996  . Smokeless tobacco: Never Used  . Alcohol Use: No  . Drug Use: No  . Sexually Active: No   Other Topics Concern  . Not on file   Social History Narrative  . No narrative on file    Current Outpatient Prescriptions on File Prior to Visit  Medication Sig Dispense Refill  . amLODipine (NORVASC) 5 MG tablet Take 1 tablet (5 mg total) by mouth daily.  90 tablet  3    Allergies  Allergen Reactions  . Ibuprofen Anaphylaxis  . Lisinopril     Fall hazard, dizzy  . Voltaren Anaphylaxis  . Adhesive (Tape)   . Codeine Nausea Only    Review of Systems  Review of Systems  Constitutional: Positive for malaise/fatigue. Negative for fever and chills.  HENT: Positive for neck pain. Negative for hearing loss, nosebleeds and congestion.   Eyes: Negative for discharge.  Respiratory: Negative for cough, sputum production, shortness of breath and wheezing.   Cardiovascular: Negative for chest pain, palpitations and leg swelling.       DOE  Gastrointestinal: Negative for heartburn, nausea, vomiting, abdominal pain, diarrhea, constipation and blood in stool.  Genitourinary: Negative for dysuria, urgency, frequency and hematuria.  Musculoskeletal: Positive for back pain. Negative for myalgias and falls.  Skin: Negative for  rash.  Neurological: Negative for dizziness, tremors, sensory change, focal weakness, loss of consciousness, weakness and headaches.  Endo/Heme/Allergies: Negative for polydipsia. Does not bruise/bleed easily.  Psychiatric/Behavioral: Negative for depression and suicidal ideas. The patient is not nervous/anxious and does not have insomnia.     Objective  BP 147/85  Pulse 75  Temp(Src) 98.1 F (36.7 C) (Temporal)  Ht 5' 5.5" (1.664 m)  Wt 189 lb (85.73 kg)  BMI 30.97 kg/m2  SpO2 99%  Physical Exam  Physical Exam  Constitutional: She is oriented to person, place, and time and well-developed, well-nourished, and in no distress. No distress.  HENT:  Head: Normocephalic and atraumatic.  Right Ear: External ear normal.  Left Ear: External ear normal.  Nose: Nose normal.  Mouth/Throat: Oropharynx is clear and moist. No oropharyngeal exudate.  Eyes: Conjunctivae are normal. Pupils are equal, round, and reactive to light. Right eye exhibits no discharge. Left eye exhibits no discharge. No scleral icterus.  Neck: Normal range of motion. Neck supple. No thyromegaly present.  Cardiovascular: Normal rate, regular rhythm and intact distal pulses.   Murmur heard.      1/6 sys murmur  Pulmonary/Chest: Effort normal and breath sounds normal. No respiratory distress. She has no wheezes. She has no rales.  Abdominal: Soft. Bowel sounds are normal. She exhibits no distension and no mass. There is no tenderness.  Musculoskeletal: Normal range of motion. She exhibits no edema and no tenderness.  Lymphadenopathy:    She has no cervical adenopathy.  Neurological: She is alert and oriented to person, place, and time. She has normal reflexes. No cranial nerve deficit. Coordination normal.  Skin: Skin is warm and dry. No rash noted. She is not diaphoretic.  Psychiatric: Mood, memory and affect normal.       Assessment & Plan  Pericarditis No recurrence, responded to  antibiotics  Hypertension Mildly elevate today but patient very anxious due to first visit here. Will reassess at next visit is given a refill on her Norvasc today  Preventative health care Switching to our practice because it is closer to home. Given Tdap today, old records requested.  History of shingles No recurrence, has not taken the Zostavax  Seborrheic keratosis Many lesions of various sizes, they are interested in having the ones that get irritated and caught on clothes removed but they are warned that they often regrow and the healing does not always go smoothly. They will consider referral in the future  Back pain She is accompanied by her son  and they correlate the onset of mid back pain which is intermittent with the onset of dyspnea with exertion dating back about 6 months. She reports the back pain is not always associated with exertion sometimes occurs with sitting. When that is the case change of position will often help. Other times the pain is associated with exertion and rest is the only thing that helps  DOE (dyspnea on exertion) Her youngest son is with her today and lives with her. He is concerned because about 6 months ago she started complaining of fatigue and easy or dyspnea with exertion. Walking with him. He reports there was an episode where she became extremely diaphoretic while they were out walking and short of breath and since then she is refused to go out walking with him. We will refer to cardiology at this time for cardiac stress testing and manage accordingly once her cardiac status is evaluated  Seborrhea capitis They have seen 4 different dermatologists and have never been able to fully resolve this. At present they are using 2 OTC products and her symptoms are minimal. She uses Dermares and Psoriasin gel encouraged her to start a Krill oil supplement as well

## 2011-05-16 NOTE — Assessment & Plan Note (Signed)
Switching to our practice because it is closer to home. Given Tdap today, old records requested.

## 2011-05-16 NOTE — Patient Instructions (Signed)
Preventive Care for Adults, Female A healthy lifestyle and preventive care can promote health and wellness. Preventive health guidelines for women include the following key practices.  A routine yearly physical is a good way to check with your caregiver about your health and preventive screening. It is a chance to share any concerns and updates on your health, and to receive a thorough exam.   Visit your dentist for a routine exam and preventive care every 6 months. Brush your teeth twice a day and floss once a day. Good oral hygiene prevents tooth decay and gum disease.   The frequency of eye exams is based on your age, health, family medical history, use of contact lenses, and other factors. Follow your caregiver's recommendations for frequency of eye exams.   Eat a healthy diet. Foods like vegetables, fruits, whole grains, low-fat dairy products, and lean protein foods contain the nutrients you need without too many calories. Decrease your intake of foods high in solid fats, added sugars, and salt. Eat the right amount of calories for you.Get information about a proper diet from your caregiver, if necessary.   Regular physical exercise is one of the most important things you can do for your health. Most adults should get at least 150 minutes of moderate-intensity exercise (any activity that increases your heart rate and causes you to sweat) each week. In addition, most adults need muscle-strengthening exercises on 2 or more days a week.   Maintain a healthy weight. The body mass index (BMI) is a screening tool to identify possible weight problems. It provides an estimate of body fat based on height and weight. Your caregiver can help determine your BMI, and can help you achieve or maintain a healthy weight.For adults 20 years and older:   A BMI below 18.5 is considered underweight.   A BMI of 18.5 to 24.9 is normal.   A BMI of 25 to 29.9 is considered overweight.   A BMI of 30 and above is  considered obese.   Maintain normal blood lipids and cholesterol levels by exercising and minimizing your intake of saturated fat. Eat a balanced diet with plenty of fruit and vegetables. Blood tests for lipids and cholesterol should begin at age 20 and be repeated every 5 years. If your lipid or cholesterol levels are high, you are over 50, or you are at high risk for heart disease, you may need your cholesterol levels checked more frequently.Ongoing high lipid and cholesterol levels should be treated with medicines if diet and exercise are not effective.   If you smoke, find out from your caregiver how to quit. If you do not use tobacco, do not start.   If you are pregnant, do not drink alcohol. If you are breastfeeding, be very cautious about drinking alcohol. If you are not pregnant and choose to drink alcohol, do not exceed 1 drink per day. One drink is considered to be 12 ounces (355 mL) of beer, 5 ounces (148 mL) of wine, or 1.5 ounces (44 mL) of liquor.   Avoid use of street drugs. Do not share needles with anyone. Ask for help if you need support or instructions about stopping the use of drugs.   High blood pressure causes heart disease and increases the risk of stroke. Your blood pressure should be checked at least every 1 to 2 years. Ongoing high blood pressure should be treated with medicines if weight loss and exercise are not effective.   If you are 55 to 76   years old, ask your caregiver if you should take aspirin to prevent strokes.   Diabetes screening involves taking a blood sample to check your fasting blood sugar level. This should be done once every 3 years, after age 45, if you are within normal weight and without risk factors for diabetes. Testing should be considered at a younger age or be carried out more frequently if you are overweight and have at least 1 risk factor for diabetes.   Breast cancer screening is essential preventive care for women. You should practice "breast  self-awareness." This means understanding the normal appearance and feel of your breasts and may include breast self-examination. Any changes detected, no matter how small, should be reported to a caregiver. Women in their 20s and 30s should have a clinical breast exam (CBE) by a caregiver as part of a regular health exam every 1 to 3 years. After age 40, women should have a CBE every year. Starting at age 40, women should consider having a mammography (breast X-ray test) every year. Women who have a family history of breast cancer should talk to their caregiver about genetic screening. Women at a high risk of breast cancer should talk to their caregivers about having magnetic resonance imaging (MRI) and a mammography every year.   The Pap test is a screening test for cervical cancer. A Pap test can show cell changes on the cervix that might become cervical cancer if left untreated. A Pap test is a procedure in which cells are obtained and examined from the lower end of the uterus (cervix).   Women should have a Pap test starting at age 21.   Between ages 21 and 29, Pap tests should be repeated every 2 years.   Beginning at age 30, you should have a Pap test every 3 years as long as the past 3 Pap tests have been normal.   Some women have medical problems that increase the chance of getting cervical cancer. Talk to your caregiver about these problems. It is especially important to talk to your caregiver if a new problem develops soon after your last Pap test. In these cases, your caregiver may recommend more frequent screening and Pap tests.   The above recommendations are the same for women who have or have not gotten the vaccine for human papillomavirus (HPV).   If you had a hysterectomy for a problem that was not cancer or a condition that could lead to cancer, then you no longer need Pap tests. Even if you no longer need a Pap test, a regular exam is a good idea to make sure no other problems are  starting.   If you are between ages 65 and 70, and you have had normal Pap tests going back 10 years, you no longer need Pap tests. Even if you no longer need a Pap test, a regular exam is a good idea to make sure no other problems are starting.   If you have had past treatment for cervical cancer or a condition that could lead to cancer, you need Pap tests and screening for cancer for at least 20 years after your treatment.   If Pap tests have been discontinued, risk factors (such as a new sexual partner) need to be reassessed to determine if screening should be resumed.   The HPV test is an additional test that may be used for cervical cancer screening. The HPV test looks for the virus that can cause the cell changes on the cervix.   The cells collected during the Pap test can be tested for HPV. The HPV test could be used to screen women aged 30 years and older, and should be used in women of any age who have unclear Pap test results. After the age of 30, women should have HPV testing at the same frequency as a Pap test.   Colorectal cancer can be detected and often prevented. Most routine colorectal cancer screening begins at the age of 50 and continues through age 75. However, your caregiver may recommend screening at an earlier age if you have risk factors for colon cancer. On a yearly basis, your caregiver may provide home test kits to check for hidden blood in the stool. Use of a small camera at the end of a tube, to directly examine the colon (sigmoidoscopy or colonoscopy), can detect the earliest forms of colorectal cancer. Talk to your caregiver about this at age 50, when routine screening begins. Direct examination of the colon should be repeated every 5 to 10 years through age 75, unless early forms of pre-cancerous polyps or small growths are found.   Hepatitis C blood testing is recommended for all people born from 1945 through 1965 and any individual with known risks for hepatitis C.    Practice safe sex. Use condoms and avoid high-risk sexual practices to reduce the spread of sexually transmitted infections (STIs). STIs include gonorrhea, chlamydia, syphilis, trichomonas, herpes, HPV, and human immunodeficiency virus (HIV). Herpes, HIV, and HPV are viral illnesses that have no cure. They can result in disability, cancer, and death. Sexually active women aged 25 and younger should be checked for chlamydia. Older women with new or multiple partners should also be tested for chlamydia. Testing for other STIs is recommended if you are sexually active and at increased risk.   Osteoporosis is a disease in which the bones lose minerals and strength with aging. This can result in serious bone fractures. The risk of osteoporosis can be identified using a bone density scan. Women ages 65 and over and women at risk for fractures or osteoporosis should discuss screening with their caregivers. Ask your caregiver whether you should take a calcium supplement or vitamin D to reduce the rate of osteoporosis.   Menopause can be associated with physical symptoms and risks. Hormone replacement therapy is available to decrease symptoms and risks. You should talk to your caregiver about whether hormone replacement therapy is right for you.   Use sunscreen with sun protection factor (SPF) of 30 or more. Apply sunscreen liberally and repeatedly throughout the day. You should seek shade when your shadow is shorter than you. Protect yourself by wearing long sleeves, pants, a wide-brimmed hat, and sunglasses year round, whenever you are outdoors.   Once a month, do a whole body skin exam, using a mirror to look at the skin on your back. Notify your caregiver of new moles, moles that have irregular borders, moles that are larger than a pencil eraser, or moles that have changed in shape or color.   Stay current with required immunizations.   Influenza. You need a dose every fall (or winter). The composition of  the flu vaccine changes each year, so being vaccinated once is not enough.   Pneumococcal polysaccharide. You need 1 to 2 doses if you smoke cigarettes or if you have certain chronic medical conditions. You need 1 dose at age 65 (or older) if you have never been vaccinated.   Tetanus, diphtheria, pertussis (Tdap, Td). Get 1 dose of   Tdap vaccine if you are younger than age 65, are over 65 and have contact with an infant, are a healthcare worker, are pregnant, or simply want to be protected from whooping cough. After that, you need a Td booster dose every 10 years. Consult your caregiver if you have not had at least 3 tetanus and diphtheria-containing shots sometime in your life or have a deep or dirty wound.   HPV. You need this vaccine if you are a woman age 26 or younger. The vaccine is given in 3 doses over 6 months.   Measles, mumps, rubella (MMR). You need at least 1 dose of MMR if you were born in 1957 or later. You may also need a second dose.   Meningococcal. If you are age 19 to 21 and a first-year college student living in a residence hall, or have one of several medical conditions, you need to get vaccinated against meningococcal disease. You may also need additional booster doses.   Zoster (shingles). If you are age 60 or older, you should get this vaccine.   Varicella (chickenpox). If you have never had chickenpox or you were vaccinated but received only 1 dose, talk to your caregiver to find out if you need this vaccine.   Hepatitis A. You need this vaccine if you have a specific risk factor for hepatitis A virus infection or you simply wish to be protected from this disease. The vaccine is usually given as 2 doses, 6 to 18 months apart.   Hepatitis B. You need this vaccine if you have a specific risk factor for hepatitis B virus infection or you simply wish to be protected from this disease. The vaccine is given in 3 doses, usually over 6 months.  Preventive Services /  Frequency Ages 19 to 39  Blood pressure check.** / Every 1 to 2 years.   Lipid and cholesterol check.** / Every 5 years beginning at age 20.   Clinical breast exam.** / Every 3 years for women in their 20s and 30s.   Pap test.** / Every 2 years from ages 21 through 29. Every 3 years starting at age 30 through age 65 or 70 with a history of 3 consecutive normal Pap tests.   HPV screening.** / Every 3 years from ages 30 through ages 65 to 70 with a history of 3 consecutive normal Pap tests.   Hepatitis C blood test.** / For any individual with known risks for hepatitis C.   Skin self-exam. / Monthly.   Influenza immunization.** / Every year.   Pneumococcal polysaccharide immunization.** / 1 to 2 doses if you smoke cigarettes or if you have certain chronic medical conditions.   Tetanus, diphtheria, pertussis (Tdap, Td) immunization. / A one-time dose of Tdap vaccine. After that, you need a Td booster dose every 10 years.   HPV immunization. / 3 doses over 6 months, if you are 26 and younger.   Measles, mumps, rubella (MMR) immunization. / You need at least 1 dose of MMR if you were born in 1957 or later. You may also need a second dose.   Meningococcal immunization. / 1 dose if you are age 19 to 21 and a first-year college student living in a residence hall, or have one of several medical conditions, you need to get vaccinated against meningococcal disease. You may also need additional booster doses.   Varicella immunization.** / Consult your caregiver.   Hepatitis A immunization.** / Consult your caregiver. 2 doses, 6 to 18 months   apart.   Hepatitis B immunization.** / Consult your caregiver. 3 doses usually over 6 months.  Ages 40 to 64  Blood pressure check.** / Every 1 to 2 years.   Lipid and cholesterol check.** / Every 5 years beginning at age 20.   Clinical breast exam.** / Every year after age 40.   Mammogram.** / Every year beginning at age 40 and continuing for as  long as you are in good health. Consult with your caregiver.   Pap test.** / Every 3 years starting at age 30 through age 65 or 70 with a history of 3 consecutive normal Pap tests.   HPV screening.** / Every 3 years from ages 30 through ages 65 to 70 with a history of 3 consecutive normal Pap tests.   Fecal occult blood test (FOBT) of stool. / Every year beginning at age 50 and continuing until age 75. You may not need to do this test if you get a colonoscopy every 10 years.   Flexible sigmoidoscopy or colonoscopy.** / Every 5 years for a flexible sigmoidoscopy or every 10 years for a colonoscopy beginning at age 50 and continuing until age 75.   Hepatitis C blood test.** / For all people born from 1945 through 1965 and any individual with known risks for hepatitis C.   Skin self-exam. / Monthly.   Influenza immunization.** / Every year.   Pneumococcal polysaccharide immunization.** / 1 to 2 doses if you smoke cigarettes or if you have certain chronic medical conditions.   Tetanus, diphtheria, pertussis (Tdap, Td) immunization.** / A one-time dose of Tdap vaccine. After that, you need a Td booster dose every 10 years.   Measles, mumps, rubella (MMR) immunization. / You need at least 1 dose of MMR if you were born in 1957 or later. You may also need a second dose.   Varicella immunization.** / Consult your caregiver.   Meningococcal immunization.** / Consult your caregiver.   Hepatitis A immunization.** / Consult your caregiver. 2 doses, 6 to 18 months apart.   Hepatitis B immunization.** / Consult your caregiver. 3 doses, usually over 6 months.  Ages 65 and over  Blood pressure check.** / Every 1 to 2 years.   Lipid and cholesterol check.** / Every 5 years beginning at age 20.   Clinical breast exam.** / Every year after age 40.   Mammogram.** / Every year beginning at age 40 and continuing for as long as you are in good health. Consult with your caregiver.   Pap test.** /  Every 3 years starting at age 30 through age 65 or 70 with a 3 consecutive normal Pap tests. Testing can be stopped between 65 and 70 with 3 consecutive normal Pap tests and no abnormal Pap or HPV tests in the past 10 years.   HPV screening.** / Every 3 years from ages 30 through ages 65 or 70 with a history of 3 consecutive normal Pap tests. Testing can be stopped between 65 and 70 with 3 consecutive normal Pap tests and no abnormal Pap or HPV tests in the past 10 years.   Fecal occult blood test (FOBT) of stool. / Every year beginning at age 50 and continuing until age 75. You may not need to do this test if you get a colonoscopy every 10 years.   Flexible sigmoidoscopy or colonoscopy.** / Every 5 years for a flexible sigmoidoscopy or every 10 years for a colonoscopy beginning at age 50 and continuing until age 75.   Hepatitis   C blood test.** / For all people born from [birth year] through 1965 and any individual with known risks for hepatitis C.   Osteoporosis screening.** / A one-time screening for women ages 14 and over and women at risk for fractures or osteoporosis.   Skin self-exam. / Monthly.   Influenza immunization.** / Every year.   Pneumococcal polysaccharide immunization.** / 1 dose at age 68 (or older) if you have never been vaccinated.   Tetanus, diphtheria, pertussis (Tdap, Td) immunization. / A one-time dose of Tdap vaccine if you are over 65 and have contact with an infant, are a Research scientist (physical sciences), or simply want to be protected from whooping cough. After that, you need a Td booster dose every 10 years.   Varicella immunization.** / Consult your caregiver.   Meningococcal immunization.** / Consult your caregiver.   Hepatitis A immunization.** / Consult your caregiver. 2 doses, 6 to 18 months apart.   Hepatitis B immunization.** / Check with your caregiver. 3 doses, usually over 6 months.  ** Family history and personal history of risk and conditions may change your caregiver's  recommendations. Document Released: 03/19/2001 Document Revised: 01/10/2011 Document Reviewed: 06/18/2010 [facility] Patient Information 2012 No Name, [place].    Start a daily fatty acid supplement such as fish oil, flaxseed oil or Krill oil (MegaRed by [employer])   Take a probiotic such as Nutritional therapist Colon health daily to see if it helps the constipation and whenever you take the antibiotic

## 2011-05-16 NOTE — Assessment & Plan Note (Signed)
Mildly elevate today but patient very anxious due to first visit here. Will reassess at next visit is given a refill on her Norvasc today

## 2011-05-16 NOTE — Assessment & Plan Note (Signed)
Many lesions of various sizes, they are interested in having the ones that get irritated and caught on clothes removed but they are warned that they often regrow and the healing does not always go smoothly. They will consider referral in the future

## 2011-05-16 NOTE — Assessment & Plan Note (Addendum)
They have seen 4 different dermatologists and have never been able to fully resolve this. At present they are using 2 OTC products and her symptoms are minimal. She uses Dermares and Psoriasin gel encouraged her to start a Krill oil supplement as well

## 2011-05-16 NOTE — Assessment & Plan Note (Signed)
No recurrence, responded to antibiotics

## 2011-05-16 NOTE — Assessment & Plan Note (Signed)
She is accompanied by her son and they correlate the onset of mid back pain which is intermittent with the onset of dyspnea with exertion dating back about 6 months. She reports the back pain is not always associated with exertion sometimes occurs with sitting. When that is the case change of position will often help. Other times the pain is associated with exertion and rest is the only thing that helps

## 2011-05-16 NOTE — Assessment & Plan Note (Signed)
No recurrence, has not taken the Zostavax

## 2011-05-16 NOTE — Assessment & Plan Note (Signed)
Her youngest son is with her today and lives with her. He is concerned because about 6 months ago she started complaining of fatigue and easy or dyspnea with exertion. Walking with him. He reports there was an episode where she became extremely diaphoretic while they were out walking and short of breath and since then she is refused to go out walking with him. We will refer to cardiology at this time for cardiac stress testing and manage accordingly once her cardiac status is evaluated

## 2011-05-19 LAB — URINE CULTURE

## 2011-05-20 MED ORDER — AMOXICILLIN-POT CLAVULANATE 875-125 MG PO TABS: 1.0000 | ORAL_TABLET | Freq: Two times a day (BID) | ORAL | Status: DC

## 2011-05-20 NOTE — Progress Notes (Signed)
Addended by: Court Joy on: 05/20/2011 02:00 PM   Modules accepted: Orders

## 2011-05-21 ENCOUNTER — Ambulatory Visit (INDEPENDENT_AMBULATORY_CARE_PROVIDER_SITE_OTHER): Payer: Medicare Other | Admitting: Cardiology

## 2011-05-21 VITALS — BP 150/100 | HR 90 | Ht 65.0 in | Wt 190.1 lb

## 2011-05-21 DIAGNOSIS — R0609 Other forms of dyspnea: Secondary | ICD-10-CM

## 2011-05-21 DIAGNOSIS — R0602 Shortness of breath: Secondary | ICD-10-CM

## 2011-05-21 DIAGNOSIS — I319 Disease of pericardium, unspecified: Secondary | ICD-10-CM

## 2011-05-21 DIAGNOSIS — I10 Essential (primary) hypertension: Secondary | ICD-10-CM

## 2011-05-21 DIAGNOSIS — R0989 Other specified symptoms and signs involving the circulatory and respiratory systems: Secondary | ICD-10-CM

## 2011-05-21 NOTE — Patient Instructions (Signed)
Your physician recommends that you schedule a follow-up appointment in: 4 weeks.   Your physician has requested that you have a carotid duplex. This test is an ultrasound of the carotid arteries in your neck. It looks at blood flow through these arteries that supply the brain with blood. Allow one hour for this exam. There are no restrictions or special instructions.   Your physician has requested that you have an echocardiogram. Echocardiography is a painless test that uses sound waves to create images of your heart. It provides your doctor with information about the size and shape of your heart and how well your heart's chambers and valves are working. This procedure takes approximately one hour. There are no restrictions for this procedure.

## 2011-05-26 NOTE — Progress Notes (Signed)
HPI:  This patient is brought in by her son.  He wanted her seen.  I know him primarily through his work as a Retail buyer to prior patients of mine who have passed.  His mother is older, and the two of them live together.  She is fatigued more often, and has more shortness of breath if the two of them take off through the neighborhood.  It is not dramatic, but has been gradual, and importantly, her overall activity has declined in recent years.  She denies any type of chest pain with this, but does note more discomfort through the back if she tries to do anything.  This has gradually occurred over the past six months or so.  She will slow down, and stop, with relief.  She will also get sweaty with this.  They have been seen by Dr. Abner Greenspan most recently for long term continuing care, and her nice noted is included which provides many of the details.    Current Outpatient Prescriptions  Medication Sig Dispense Refill  . acetaminophen (TYLENOL) 500 MG tablet Take 500 mg by mouth every 6 (six) hours as needed.      Marland Kitchen amLODipine (NORVASC) 5 MG tablet Take 1 tablet (5 mg total) by mouth daily.  90 tablet  3  . amoxicillin-clavulanate (AUGMENTIN) 875-125 MG per tablet Take 1 tablet by mouth 2 (two) times daily.  10 tablet  0  . Multiple Vitamins-Minerals (CENTRUM SILVER PO) Take 1 tablet by mouth daily.        Allergies  Allergen Reactions  . Ibuprofen Anaphylaxis  . Lisinopril     Fall hazard, dizzy  . Voltaren Anaphylaxis  . Adhesive (Tape)   . Codeine Nausea Only    Past Medical History  Diagnosis Date  . Psoriasis     scalp  . Cancer     basal cells on back, shoulder and nose  . Chicken pox as a child  . Mumps as a child  . Measles as a child  . Hypertension   . Fainting spell     X 1 time, at home in hospital 3 days  . Broken leg 1996  . Arthritis     cervical, spurs  . Neck pain, chronic   . History of shingles   . Seborrhea capitis   . Seborrheic keratosis   .  Pericarditis   . Back pain 05/16/2011  . DOE (dyspnea on exertion) 05/16/2011  . Preventative health care 05/16/2011    Past Surgical History  Procedure Date  . Paracarditice 1981    [place]  . Back disc repair 1979    low back with LLE radiculopathy  . Double knee replacement 1990  . Anaphalactic shock 1990    due to drug Voltaren  . Breast lumpectomy 1947    left  . Cataract extraction, bilateral   . Tubal ligation     Family History  Problem Relation Age of Onset  . Cancer Mother     ovarian  . Cancer Father     pancreatic  . Heart disease Son   . Diabetes Son     type 2  . COPD Son     smoked  . Emphysema Son     History   Social History  . Marital Status: Widowed    Spouse Name: N/A    Number of Children: N/A  . Years of Education: N/A   Occupational History  . Not on file.   Social  History Main Topics  . Smoking status: Former Smoker -- 41 years    Types: Cigarettes    Quit date: 02/05/1996  . Smokeless tobacco: Never Used  . Alcohol Use: No  . Drug Use: No  . Sexually Active: No   Other Topics Concern  . Not on file   Social History Narrative  . No narrative on file    ROS: Please see the HPI.  All other systems reviewed and negative.  No specific GI complaints.  No cough or fever.  No orthopnea.  No chest pain.   PHYSICAL EXAM:  BP 150/100  Pulse 90  Ht 5\' 5"  (1.651 m)  Wt 190 lb 1.9 oz (86.238 kg)  BMI 31.64 kg/m2  Repeat by me:  141/75    General: Well developed, well nourished, in no acute distress. Head:  Normocephalic and atraumatic. Neck: no JVD.  There are carotid bruits, left greater than R, but both soft.   Lungs: Clear to auscultation and percussion. Heart: Normal S1 and S2.  No murmur, rubs or gallops.  Abdomen:  Normal bowel sounds; soft; non tender; no organomegaly Pulses: Pulses normal in all 4 extremities. Extremities: No clubbing or cyanosis. No edema. Neurologic: Alert and oriented x 3.  EKG:  NSR.Marland Kitchen  Borderline first  degree AV block.    ASSESSMENT AND PLAN:

## 2011-05-27 ENCOUNTER — Encounter: Payer: Self-pay | Admitting: Cardiology

## 2011-05-27 DIAGNOSIS — R0989 Other specified symptoms and signs involving the circulatory and respiratory systems: Secondary | ICD-10-CM | POA: Insufficient documentation

## 2011-05-27 NOTE — Assessment & Plan Note (Signed)
It is elevated today.  Need to check these values on regular basis.  Her son will get a BP cuff.

## 2011-05-27 NOTE — Assessment & Plan Note (Addendum)
She has some dyspnea of effort, and we discussed this in detail today.  I mentioned that this could be associated with CAD, often seen in women, particularly older ones.  Conversely, it could also simply reflect deconditioning.  We discussed various avenues for evaluation.  I told her and her son she most likely has CAD just simply by age.  An echo would be helpful to establish her baseline LVEF.  She does not have clinical signs of failure.  We talked about the issue of cardiac stress testing.  She cannot walk.  The indications for proceeding with stress imaging, via a pharmacologic study, would be primarily if we planned to do a cardiac cath study.  For now, given her age, that would not be her interest.  She also has a prior history of pericarditis a few years back, and that is why echo would be most helpful.  Better control of her BP would also be likely helpful, and I have encouraged them to get a BP cuff and check these values at home.  We will see her back in follow up in about four weeks, and reassess her situation.  Long discussion today about all of these things.

## 2011-05-27 NOTE — Assessment & Plan Note (Signed)
Not active.  Many years ago.  Check echo to exclude constrictive changes.

## 2011-05-27 NOTE — Assessment & Plan Note (Signed)
Will get a carotid ultrasound.

## 2011-05-30 ENCOUNTER — Encounter (INDEPENDENT_AMBULATORY_CARE_PROVIDER_SITE_OTHER): Payer: Medicare Other

## 2011-05-30 DIAGNOSIS — R0989 Other specified symptoms and signs involving the circulatory and respiratory systems: Secondary | ICD-10-CM

## 2011-06-04 ENCOUNTER — Ambulatory Visit (HOSPITAL_COMMUNITY): Payer: Medicare Other | Attending: Cardiology

## 2011-06-04 ENCOUNTER — Other Ambulatory Visit: Payer: Self-pay

## 2011-06-04 DIAGNOSIS — I359 Nonrheumatic aortic valve disorder, unspecified: Secondary | ICD-10-CM | POA: Insufficient documentation

## 2011-06-04 DIAGNOSIS — R0602 Shortness of breath: Secondary | ICD-10-CM

## 2011-06-04 DIAGNOSIS — R55 Syncope and collapse: Secondary | ICD-10-CM | POA: Insufficient documentation

## 2011-06-04 DIAGNOSIS — R5383 Other fatigue: Secondary | ICD-10-CM | POA: Insufficient documentation

## 2011-06-04 DIAGNOSIS — I519 Heart disease, unspecified: Secondary | ICD-10-CM | POA: Insufficient documentation

## 2011-06-04 DIAGNOSIS — I319 Disease of pericardium, unspecified: Secondary | ICD-10-CM | POA: Insufficient documentation

## 2011-06-04 DIAGNOSIS — R5381 Other malaise: Secondary | ICD-10-CM | POA: Insufficient documentation

## 2011-06-04 DIAGNOSIS — I10 Essential (primary) hypertension: Secondary | ICD-10-CM | POA: Insufficient documentation

## 2011-06-04 DIAGNOSIS — R0609 Other forms of dyspnea: Secondary | ICD-10-CM

## 2011-06-04 DIAGNOSIS — I517 Cardiomegaly: Secondary | ICD-10-CM | POA: Insufficient documentation

## 2011-06-04 DIAGNOSIS — R0989 Other specified symptoms and signs involving the circulatory and respiratory systems: Secondary | ICD-10-CM | POA: Insufficient documentation

## 2011-06-18 ENCOUNTER — Ambulatory Visit (INDEPENDENT_AMBULATORY_CARE_PROVIDER_SITE_OTHER): Payer: Medicare Other | Admitting: Cardiology

## 2011-06-18 ENCOUNTER — Encounter: Payer: Self-pay | Admitting: Cardiology

## 2011-06-18 VITALS — BP 160/84 | HR 84 | Ht 65.5 in | Wt 191.1 lb

## 2011-06-18 DIAGNOSIS — I10 Essential (primary) hypertension: Secondary | ICD-10-CM

## 2011-06-18 DIAGNOSIS — R0609 Other forms of dyspnea: Secondary | ICD-10-CM

## 2011-06-18 DIAGNOSIS — I350 Nonrheumatic aortic (valve) stenosis: Secondary | ICD-10-CM | POA: Insufficient documentation

## 2011-06-18 DIAGNOSIS — I359 Nonrheumatic aortic valve disorder, unspecified: Secondary | ICD-10-CM

## 2011-06-18 NOTE — Patient Instructions (Signed)
Your physician wants you to follow-up in: 1 YEAR with Dr Jens Som. You will receive a reminder letter in the mail two months in advance. If you don't receive a letter, please call our office to schedule the follow-up appointment.  Your physician recommends that you continue on your current medications as directed. Please refer to the Current Medication list given to you today.

## 2011-06-18 NOTE — Assessment & Plan Note (Signed)
Should remain active.

## 2011-06-18 NOTE — Assessment & Plan Note (Signed)
Generally around 140 at home.  Would not change meds.

## 2011-06-18 NOTE — Assessment & Plan Note (Signed)
Mild by echo.  Needs follow up in one year.  Continue to observe.  Doubt any symptoms related to this.

## 2011-06-18 NOTE — Progress Notes (Signed)
HPI:  She is the same.  We reveiwed all of her studies in detail today.  She is not want to have too much done.  She sits and crochets but does not get up and move around very much.  Never has chest pain.  Otherwise stable.    Current Outpatient Prescriptions  Medication Sig Dispense Refill  . acetaminophen (TYLENOL) 500 MG tablet Take 500 mg by mouth every 6 (six) hours as needed.      Marland Kitchen amLODipine (NORVASC) 5 MG tablet Take 1 tablet (5 mg total) by mouth daily.  90 tablet  3  . doxycycline (VIBRAMYCIN) 50 MG capsule Take 50 mg by mouth as needed. Use for flare up for  rosacea      . Multiple Vitamins-Minerals (CENTRUM SILVER PO) Take 1 tablet by mouth daily.      Marland Kitchen omega-3 acid ethyl esters (LOVAZA) 1 G capsule Take 1 g by mouth daily.        Allergies  Allergen Reactions  . Diclofenac Sodium Anaphylaxis  . Ibuprofen Anaphylaxis  . Lisinopril     Fall hazard, dizzy  . Adhesive (Tape)   . Codeine Nausea Only    Past Medical History  Diagnosis Date  . Psoriasis     scalp  . Cancer     basal cells on back, shoulder and nose  . Chicken pox as a child  . Mumps as a child  . Measles as a child  . Hypertension   . Fainting spell     X 1 time, at home in hospital 3 days  . Broken leg 1996  . Arthritis     cervical, spurs  . Neck pain, chronic   . History of shingles   . Seborrhea capitis   . Seborrheic keratosis   . Pericarditis   . Back pain 05/16/2011  . DOE (dyspnea on exertion) 05/16/2011  . Preventative health care 05/16/2011    Past Surgical History  Procedure Date  . Paracarditice 1981    [place]  . Back disc repair 1979    low back with LLE radiculopathy  . Double knee replacement 1990  . Anaphalactic shock 1990    due to drug Voltaren  . Breast lumpectomy 1947    left  . Cataract extraction, bilateral   . Tubal ligation     Family History  Problem Relation Age of Onset  . Cancer Mother     ovarian  . Cancer Father     pancreatic  . Heart disease Son     . Diabetes Son     type 2  . COPD Son     smoked  . Emphysema Son     History   Social History  . Marital Status: Widowed    Spouse Name: N/A    Number of Children: N/A  . Years of Education: N/A   Occupational History  . Not on file.   Social History Main Topics  . Smoking status: Former Smoker -- 41 years    Types: Cigarettes    Quit date: 02/05/1996  . Smokeless tobacco: Never Used  . Alcohol Use: No  . Drug Use: No  . Sexually Active: No   Other Topics Concern  . Not on file   Social History Narrative  . No narrative on file    ROS: Please see the HPI.  All other systems reviewed and negative.  PHYSICAL EXAM:  BP 160/84  Pulse 84  Ht 5' 5.5" (1.664 m)  Wt 191 lb 1.9 oz (86.691 kg)  BMI 31.32 kg/m2  General: Well developed, well nourished, in no acute distress. Head:  Normocephalic and atraumatic. Neck: no JVD Lungs: Clear to auscultation and percussion. Heart: Normal S1 and S2.  SEM.  No DM.    Pulses: Pulses normal in all 4 extremities. Extremities: No clubbing or cyanosis. No edema. Neurologic: Alert and oriented x 3.  EKG:  Not done  ASSESSMENT AND PLAN:

## 2011-06-19 ENCOUNTER — Ambulatory Visit (INDEPENDENT_AMBULATORY_CARE_PROVIDER_SITE_OTHER): Payer: Medicare Other | Admitting: Family Medicine

## 2011-06-19 ENCOUNTER — Encounter: Payer: Self-pay | Admitting: Family Medicine

## 2011-06-19 VITALS — BP 138/74 | HR 83 | Temp 99.3°F | Ht 65.5 in | Wt 194.4 lb

## 2011-06-19 DIAGNOSIS — N390 Urinary tract infection, site not specified: Secondary | ICD-10-CM

## 2011-06-19 DIAGNOSIS — I359 Nonrheumatic aortic valve disorder, unspecified: Secondary | ICD-10-CM

## 2011-06-19 DIAGNOSIS — I10 Essential (primary) hypertension: Secondary | ICD-10-CM

## 2011-06-19 DIAGNOSIS — R350 Frequency of micturition: Secondary | ICD-10-CM

## 2011-06-19 DIAGNOSIS — E785 Hyperlipidemia, unspecified: Secondary | ICD-10-CM

## 2011-06-19 DIAGNOSIS — I350 Nonrheumatic aortic (valve) stenosis: Secondary | ICD-10-CM

## 2011-06-19 HISTORY — DX: Hyperlipidemia, unspecified: E78.5

## 2011-06-19 LAB — POCT URINALYSIS DIPSTICK
Glucose, UA: NEGATIVE
Ketones, UA: 15
Nitrite, UA: NEGATIVE
Spec Grav, UA: 1.02
Urobilinogen, UA: 0.2

## 2011-06-19 NOTE — Assessment & Plan Note (Signed)
Mild elevation, continue Krill oil and avoid trans fats.

## 2011-06-19 NOTE — Patient Instructions (Signed)
Seborrheic Dermatitis Seborrheic dermatitis involves pink or red skin with greasy, flaky scales. This is often found on the scalp, eyebrows, nose, bearded area, and on or behind the ears. It can also occur on the central chest. It often occurs where there are more oil (sebaceous) glands. This condition is also known as dandruff. When this condition affects a baby's scalp, it is called cradle cap. It may come and go for no known reason. It can occur at any time of life from infancy to old age. CAUSES  The cause is unknown. It is not the result of too little moisture or too much oil. In some people, seborrheic dermatitis flare-ups seem to be triggered by stress. It also commonly occurs in people with certain diseases such as Parkinson's disease or HIV/AIDS. SYMPTOMS   Thick scales on the scalp.   Redness on the face or in the armpits.   The skin may seem oily or dry, but moisturizers do not help.   In infants, seborrheic dermatitis appears as scaly redness that does not seem to bother the baby. In some babies, it affects only the scalp. In others, it also affects the neck creases, armpits, groin, or behind the ears.   In adults and adolescents, seborrheic dermatitis may affect only the scalp. It may look patchy or spread out, with areas of redness and flaking. Other areas commonly affected include:   Eyebrows.   Eyelids.   Forehead.   Skin behind the ears.   Outer ears.   Chest.   Armpits.   Nose creases.   Skin creases under the breasts.   Skin between the buttocks.   Groin.   Some adults and adolescents feel itching or burning in the affected areas.  DIAGNOSIS  Your caregiver can usually tell what the problem is by doing a physical exam. TREATMENT   Cortisone (steroid) ointments, creams, and lotions can help decrease inflammation.   Babies can be treated with baby oil to soften the scales, then they may be washed with baby shampoo. If this does not help, medicated  shampoos should work.   Adults can also use medicated shampoos.   Your caregiver may prescribe corticosteroid cream and shampoo containing an antifungal or yeast medicine (ketoconazole). Hydrocortisone or anti-yeast cream can be rubbed directly onto seborrheic dermatitis patches. Yeast does not cause seborrheic dermatitis, but it seems to add to the problem.  In infants, seborrheic dermatitis is often worst during the first year of life. It tends to disappear on its own as the child grows. However, it may return during the teenage years. In adults and adolescents, seborrheic dermatitis tends to be a long-lasting condition that comes and goes over many years. HOME CARE INSTRUCTIONS   Use prescribed medicines as directed.   In infants, do not aggressively remove the scales or flakes on the scalp with a comb or by other means. This may lead to hair loss.  SEEK MEDICAL CARE IF:   The problem does not improve from the medicated shampoos, lotions, or other medicines given by your caregiver.   You have any other questions or concerns.  Document Released: 01/21/2005 Document Revised: 01/10/2011 Document Reviewed: 06/12/2009 [facility] Patient Information 2012 [place], [place].   Try Sarna lotion for the dry, itching, burning feet

## 2011-06-19 NOTE — Progress Notes (Signed)
Patient ID: [name], female   DOB: 1924-05-23, 78 y.o.   MRN: [id number] [name] [id number] 29-Jul-1924 06/19/2011      Progress Note-Follow Up  Subjective  Chief Complaint  Chief Complaint  Patient presents with  . Follow-up    1 month    HPI  Patient is a 78 year old Caucasian female who is in today for followup on her new patient appointment accompanied by her son. He reports she's feeling well. They deny any fevers, headache, chest pain, palpitations, shortness of breath, GI or GU complaints. They have recently been seen by cardiology and been offered reassurance that from a cardiac standpoint she is stable. They're pleased with his parents. He often no new complaints at this time. She was treated with open for UTI and denies any urinary symptoms at this time.  Past Medical History  Diagnosis Date  . Psoriasis     scalp  . Cancer     basal cells on back, shoulder and nose  . Chicken pox as a child  . Mumps as a child  . Measles as a child  . Hypertension   . Fainting spell     X 1 time, at home in hospital 3 days  . Broken leg 1996  . Arthritis     cervical, spurs  . Neck pain, chronic   . History of shingles   . Seborrhea capitis   . Seborrheic keratosis   . Pericarditis   . Back pain 05/16/2011  . DOE (dyspnea on exertion) 05/16/2011  . Preventative health care 05/16/2011  . Hyperlipidemia 06/19/2011    Past Surgical History  Procedure Date  . Paracarditice 1981    [place]  . Back disc repair 1979    low back with LLE radiculopathy  . Double knee replacement 1990  . Anaphalactic shock 1990    due to drug Voltaren  . Breast lumpectomy 1947    left  . Cataract extraction, bilateral   . Tubal ligation     Family History  Problem Relation Age of Onset  . Cancer Mother     ovarian  . Cancer Father     pancreatic  . Heart disease Son   . Diabetes Son     type 2  . COPD Son     smoked  . Emphysema Son     History   Social History  . Marital  Status: Widowed    Spouse Name: N/A    Number of Children: N/A  . Years of Education: N/A   Occupational History  . Not on file.   Social History Main Topics  . Smoking status: Former Smoker -- 41 years    Types: Cigarettes    Quit date: 02/05/1996  . Smokeless tobacco: Never Used  . Alcohol Use: No  . Drug Use: No  . Sexually Active: No   Other Topics Concern  . Not on file   Social History Narrative  . No narrative on file    Current Outpatient Prescriptions on File Prior to Visit  Medication Sig Dispense Refill  . amLODipine (NORVASC) 5 MG tablet Take 1 tablet (5 mg total) by mouth daily.  90 tablet  3  . Multiple Vitamins-Minerals (CENTRUM SILVER PO) Take 1 tablet by mouth daily.      Marland Kitchen acetaminophen (TYLENOL) 500 MG tablet Take 500 mg by mouth every 6 (six) hours as needed.      . doxycycline (VIBRAMYCIN) 50 MG capsule Take 50 mg by mouth  as needed. Use for flare up for  rosacea        Allergies  Allergen Reactions  . Diclofenac Sodium Anaphylaxis  . Ibuprofen Anaphylaxis  . Lisinopril     Fall hazard, dizzy  . Adhesive (Tape)   . Codeine Nausea Only    Review of Systems  Review of Systems  Constitutional: Negative for fever and malaise/fatigue.  HENT: Negative for congestion.   Eyes: Negative for discharge.  Respiratory: Negative for shortness of breath.   Cardiovascular: Negative for chest pain, palpitations and leg swelling.  Gastrointestinal: Negative for nausea, abdominal pain and diarrhea.  Genitourinary: Negative for dysuria.  Musculoskeletal: Negative for falls.  Skin: Negative for rash.  Neurological: Negative for loss of consciousness and headaches.  Endo/Heme/Allergies: Negative for polydipsia.  Psychiatric/Behavioral: Negative for depression and suicidal ideas. The patient is not nervous/anxious and does not have insomnia.     Objective  BP 138/74  Pulse 83  Temp(Src) 99.3 F (37.4 C) (Temporal)  Ht 5' 5.5" (1.664 m)  Wt 194 lb 6.4 oz  (88.179 kg)  BMI 31.86 kg/m2  SpO2 96%  Physical Exam  Physical Exam  Constitutional: She is oriented to person, place, and time and well-developed, well-nourished, and in no distress. No distress.  HENT:  Head: Normocephalic and atraumatic.  Eyes: Conjunctivae are normal.  Neck: Neck supple. No thyromegaly present.  Cardiovascular: Normal rate, regular rhythm and normal heart sounds.   No murmur heard. Pulmonary/Chest: Effort normal and breath sounds normal. She has no wheezes.  Abdominal: She exhibits no distension and no mass.  Musculoskeletal: She exhibits no edema.  Lymphadenopathy:    She has no cervical adenopathy.  Neurological: She is alert and oriented to person, place, and time.  Skin: Skin is warm and dry. No rash noted. She is not diaphoretic.  Psychiatric: Memory, affect and judgment normal.    Lab Results  Component Value Date   TSH 0.91 05/16/2011   Lab Results  Component Value Date   WBC 7.0 05/16/2011   HGB 14.4 05/16/2011   HCT 44.3 05/16/2011   MCV 91.8 05/16/2011   PLT 239.0 05/16/2011   Lab Results  Component Value Date   CREATININE 1.0 05/16/2011   BUN 14 05/16/2011   NA 142 05/16/2011   K 4.6 05/16/2011   CL 107 05/16/2011   CO2 26 05/16/2011   Lab Results  Component Value Date   ALT 20 05/16/2011   AST 26 05/16/2011   ALKPHOS 55 05/16/2011   BILITOT 0.6 05/16/2011   Lab Results  Component Value Date   CHOL  Value: 221        ATP III CLASSIFICATION:  <200     mg/dL   Desirable  161-096  mg/dL   Borderline High  >=045    mg/dL   High       * 05/13/8117   Lab Results  Component Value Date   HDL 76 06/19/2009   Lab Results  Component Value Date   LDLCALC  Value: 125        Total Cholesterol/HDL:CHD Risk Coronary Heart Disease Risk Table                     Men   Women  1/2 Average Risk   3.4   3.3  Average Risk       5.0   4.4  2 X Average Risk   9.6   7.1  3 X Average Risk  23.4  11.0        Use the calculated Patient Ratio above and the CHD Risk  Table to determine the patient's CHD Risk.        ATP III CLASSIFICATION (LDL):  <100     mg/dL   Optimal  409-811  mg/dL   Near or Above                    Optimal  130-159  mg/dL   Borderline  914-782  mg/dL   High  >956     mg/dL   Very High* 03/20/863   Lab Results  Component Value Date   TRIG 101 06/19/2009   Lab Results  Component Value Date   CHOLHDL 2.9 06/19/2009     Assessment & Plan  Aortic stenosis Mild by echo, following with cardiology, repeat in one year.  Hypertension Adequately controlled, no change to meds today, minimize symptoms  Hyperlipidemia Mild elevation, continue Krill oil and avoid trans fats.  UTI (lower urinary tract infection) Treated with Augmentin, today's urine appears contaminated, will monitor

## 2011-06-19 NOTE — Assessment & Plan Note (Signed)
Mild by echo, following with cardiology, repeat in one year.

## 2011-06-19 NOTE — Assessment & Plan Note (Signed)
Adequately controlled, no change to meds today, minimize symptoms

## 2011-06-21 LAB — URINE CULTURE

## 2011-06-23 ENCOUNTER — Encounter: Payer: Self-pay | Admitting: Family Medicine

## 2011-06-23 DIAGNOSIS — N390 Urinary tract infection, site not specified: Secondary | ICD-10-CM

## 2011-06-23 HISTORY — DX: Urinary tract infection, site not specified: N39.0

## 2011-06-23 NOTE — Assessment & Plan Note (Signed)
Treated with Augmentin, today's urine appears contaminated, will monitor

## 2011-07-26 ENCOUNTER — Encounter: Payer: Self-pay | Admitting: Family Medicine

## 2011-10-23 HISTORY — PX: OTHER SURGICAL HISTORY: SHX169

## 2011-11-12 ENCOUNTER — Encounter: Payer: Self-pay | Admitting: Family Medicine

## 2011-11-12 ENCOUNTER — Ambulatory Visit (INDEPENDENT_AMBULATORY_CARE_PROVIDER_SITE_OTHER): Payer: Medicare Other | Admitting: Family Medicine

## 2011-11-12 VITALS — BP 147/84 | HR 88 | Temp 98.6°F | Ht 65.5 in | Wt 193.8 lb

## 2011-11-12 DIAGNOSIS — I10 Essential (primary) hypertension: Secondary | ICD-10-CM

## 2011-11-12 DIAGNOSIS — R7309 Other abnormal glucose: Secondary | ICD-10-CM

## 2011-11-12 DIAGNOSIS — Z23 Encounter for immunization: Secondary | ICD-10-CM

## 2011-11-12 DIAGNOSIS — E871 Hypo-osmolality and hyponatremia: Secondary | ICD-10-CM

## 2011-11-12 DIAGNOSIS — R030 Elevated blood-pressure reading, without diagnosis of hypertension: Secondary | ICD-10-CM

## 2011-11-12 DIAGNOSIS — M549 Dorsalgia, unspecified: Secondary | ICD-10-CM

## 2011-11-12 DIAGNOSIS — M533 Sacrococcygeal disorders, not elsewhere classified: Secondary | ICD-10-CM | POA: Insufficient documentation

## 2011-11-12 DIAGNOSIS — R5383 Other fatigue: Secondary | ICD-10-CM

## 2011-11-12 DIAGNOSIS — N390 Urinary tract infection, site not specified: Secondary | ICD-10-CM

## 2011-11-12 DIAGNOSIS — E785 Hyperlipidemia, unspecified: Secondary | ICD-10-CM

## 2011-11-12 HISTORY — DX: Sacrococcygeal disorders, not elsewhere classified: M53.3

## 2011-11-12 HISTORY — DX: Hypo-osmolality and hyponatremia: E87.1

## 2011-11-12 LAB — RENAL FUNCTION PANEL
CO2: 25 mEq/L (ref 19–32)
Chloride: 105 mEq/L (ref 96–112)
GFR: 47.2 mL/min — ABNORMAL LOW (ref >60.00)
Phosphorus: 3.2 mg/dL (ref 2.3–4.6)
Potassium: 4.2 mEq/L (ref 3.5–5.1)
Sodium: 137 mEq/L (ref 135–145)

## 2011-11-12 LAB — LIPID PANEL
HDL: 76.2 mg/dL (ref >39.00)
Triglycerides: 148 mg/dL (ref 0.0–149.0)
VLDL: 29.6 mg/dL (ref 0.0–40.0)

## 2011-11-12 LAB — CBC
Hemoglobin: 13.7 g/dL (ref 12.0–15.0)
MCHC: 32.9 g/dL (ref 30.0–36.0)
RDW: 14 % (ref 11.5–14.6)
WBC: 6.2 10*3/uL (ref 4.5–10.5)

## 2011-11-12 LAB — LDL CHOLESTEROL, DIRECT: Direct LDL: 131.8 mg/dL

## 2011-11-12 LAB — HEPATIC FUNCTION PANEL
Alkaline Phosphatase: 51 U/L (ref 39–117)
Bilirubin, Direct: 0 mg/dL (ref 0.0–0.3)
Total Bilirubin: 1 mg/dL (ref 0.3–1.2)

## 2011-11-12 LAB — TSH: TSH: 1.05 u[IU]/mL (ref 0.35–5.50)

## 2011-11-12 NOTE — Assessment & Plan Note (Signed)
Mild  In past, check renal panel today

## 2011-11-12 NOTE — Assessment & Plan Note (Signed)
No concerning symptoms today and she does not feel she can give a sample in the office. She is sent home with a sterile collection kit and if she can bring a sample or her son can when she develops symptoms. They are instructed to call the office and tell us if they feel they need to bring in a sample

## 2011-11-12 NOTE — Patient Instructions (Addendum)
Sacroiliac Joint Dysfunction The sacroiliac joint connects the lower part of the spine (the sacrum) with the bones of the pelvis. CAUSES  Sometimes, there is no obvious reason for sacroiliac joint dysfunction. Other times, it may occur   During pregnancy.  After injury, such as:  Car accidents.  Sport-related injuries.  Work-related injuries.  Due to one leg being shorter than the other.  Due to other conditions that affect the joints, such as:  Rheumatoid arthritis.  Gout.  Psoriasis.  Joint infection (septic arthritis). SYMPTOMS  Symptoms may include:  Pain in the:  Lower back.  Buttocks.  Groin.  Thighs and legs.  Difficult sitting, standing, walking, lying, bending or lifting. DIAGNOSIS  A number of tests may be used to help diagnose the cause of sacroiliac joint dysfunction, including:  Imaging tests to look for other causes of pain, including:  MRI.  CT scan.  Bone scan.  Diagnostic injection: During a special x-ray (called fluoroscopy), a needle is put into the sacroiliac joint. A numbing medicine is injected into the joint. If the pain is improved or stopped, the diagnosis of sacroiliac joint dysfunction is more likely. TREATMENT  There are a number of types of treatment used for sacroiliac joint dysfunction, including:  Only take over-the-counter or prescription medicines for pain, discomfort, or fever as directed by your caregiver.  Medications to relax muscles.  Rest. Decreasing activity can help cut down on painful muscle spasms and allow the back to heal.  Application of heat or ice to the lower back may improve muscle spasms and soothe pain.  Brace. A special back brace, called a sacroiliac belt, can help support the joint while your back is healing.  Physical therapy can help teach comfortable positions and exercises to strengthen muscles that support the sacroiliac joint.  Cortisone injections. Injections of steroid medicine into the  joint can help decrease swelling and improve pain.  Hyaluronic acid injections. This chemical improves lubrication within the sacroiliac joint, thereby decreasing pain.  Radiofrequency ablation. A special needle is placed into the joint, where it burns away nerves that are carrying pain messages from the joint.  Surgery. Because pain occurs during movement of the joint, screws and plates may be installed in order to limit or prevent joint motion. HOME CARE INSTRUCTIONS   Take all medications exactly as directed.  Follow instructions regarding both rest and physical activity, to avoid worsening the pain.  Do physical therapy exercises exactly as prescribed. SEEK IMMEDIATE MEDICAL CARE IF:  You experience increasingly severe pain.  You develop new symptoms, such as numbness or tingling in your legs or feet.  You lose bladder or bowel control. Document Released: 04/19/2008 Document Revised: 04/15/2011 Document Reviewed: 04/19/2008 [facility] Patient Information 2013 [place], [place].   Tylenol 500 mg twice to three times daily and call for cream if aback/hip pain worsens

## 2011-11-12 NOTE — Assessment & Plan Note (Signed)
Check lipid panel at this time and avoid trans fats, continue Krill oil caps daily

## 2011-11-12 NOTE — Assessment & Plan Note (Signed)
Left posterior hip pain, recommended Tylenol 500 mg po bid to tid, increased physical activity and to call for topical cream if no improvement. Is allergic to NSAIDs so would have to give cream without

## 2011-11-12 NOTE — Assessment & Plan Note (Signed)
Adequately controlled at this visit, no concerning symptoms

## 2011-11-12 NOTE — Progress Notes (Signed)
Patient ID: [name], female   DOB: 1924/10/25, 78 y.o.   MRN: [id number] [name] [id number] 12/29/1924 11/12/2011      Progress Note-Follow Up  Subjective  Chief Complaint  Chief Complaint  Patient presents with  . Follow-up    4 month    HPI  Patient is a 78 year old female in today for labs and follow up. Is struggling with left hip pain. She is accompanied by her son and he notes that been under a great deal of stress with family members and she's been doing more sitting on the couch in usual. She does note the pain is present when she's sitting but is worse upon arising. It is posterior and sharp at times. She has slight pain down the leg but not to the foot. He continued to struggle with edema in the left leg but when she puts up her feet and personally in the morning it is better. They're satisfied with her cardiac workup and have accepted the idea that we just have to manage her edema and shortness of breath, chest pain, recent illness, fevers, chills, GI or GU complaints. Her son does note her exercise tolerance continues to drop and they are offered physical therapy evaluation but declined. No other acute complaints at today's visit except for some low-grade fatigue and debility are noted  Past Medical History  Diagnosis Date  . Psoriasis     scalp  . Cancer     basal cells on back, shoulder and nose  . Chicken pox as a child  . Mumps as a child  . Measles as a child  . Hypertension   . Fainting spell     X 1 time, at home in hospital 3 days  . Broken leg 1996  . Arthritis     cervical, spurs  . Neck pain, chronic   . History of shingles   . Seborrhea capitis   . Seborrheic keratosis   . Pericarditis   . Back pain 05/16/2011  . DOE (dyspnea on exertion) 05/16/2011  . Preventative health care 05/16/2011  . Hyperlipidemia 06/19/2011  . UTI (lower urinary tract infection) 06/23/2011  . Hyponatremia 11/12/2011  . Sacroiliac dysfunction 11/12/2011    Past Surgical  History  Procedure Date  . Paracarditice 1981    [place]  . Back disc repair 1979    low back with LLE radiculopathy  . Double knee replacement 1990  . Anaphalactic shock 1990    due to drug Voltaren  . Breast lumpectomy 1947    left  . Cataract extraction, bilateral   . Tubal ligation   . Biopsy left leg 10-23-11    benign    Family History  Problem Relation Age of Onset  . Cancer Mother     ovarian  . Cancer Father     pancreatic  . Heart disease Son   . Diabetes Son     type 2  . COPD Son     smoked  . Emphysema Son     History   Social History  . Marital Status: Widowed    Spouse Name: N/A    Number of Children: N/A  . Years of Education: N/A   Occupational History  . Not on file.   Social History Main Topics  . Smoking status: Former Smoker -- 41 years    Types: Cigarettes    Quit date: 02/05/1996  . Smokeless tobacco: Never Used  . Alcohol Use: No  .  Drug Use: No  . Sexually Active: No   Other Topics Concern  . Not on file   Social History Narrative  . No narrative on file    Current Outpatient Prescriptions on File Prior to Visit  Medication Sig Dispense Refill  . amLODipine (NORVASC) 5 MG tablet Take 1 tablet (5 mg total) by mouth daily.  90 tablet  3  . Krill Oil Omega-3 300 MG CAPS Take 1 capsule by mouth daily.      . Multiple Vitamins-Minerals (CENTRUM SILVER PO) Take 1 tablet by mouth daily.      Marland Kitchen acetaminophen (TYLENOL) 500 MG tablet Take 500 mg by mouth every 6 (six) hours as needed.      . doxycycline (VIBRAMYCIN) 50 MG capsule Take 50 mg by mouth as needed. Use for flare up for  rosacea        Allergies  Allergen Reactions  . Diclofenac Sodium Anaphylaxis  . Ibuprofen Anaphylaxis  . Lisinopril     Fall hazard, dizzy  . Adhesive (Tape)   . Codeine Nausea Only    Review of Systems  Review of Systems  Constitutional: Positive for malaise/fatigue. Negative for fever.  HENT: Negative for congestion.   Eyes: Negative for  discharge.  Respiratory: Negative for shortness of breath.   Cardiovascular: Negative for chest pain, palpitations and leg swelling.  Gastrointestinal: Negative for nausea, abdominal pain and diarrhea.  Genitourinary: Negative for dysuria.  Musculoskeletal: Positive for joint pain. Negative for falls.       Left posterior hip pain  Skin: Negative for rash.  Neurological: Negative for loss of consciousness and headaches.  Endo/Heme/Allergies: Negative for polydipsia.  Psychiatric/Behavioral: Negative for depression and suicidal ideas. The patient is not nervous/anxious and does not have insomnia.     Objective  BP 147/84  Pulse 88  Temp 98.6 F (37 C) (Temporal)  Ht 5' 5.5" (1.664 m)  Wt 193 lb 12.8 oz (87.907 kg)  BMI 31.76 kg/m2  SpO2 96%  Physical Exam  Physical Exam  Constitutional: She is oriented to person, place, and time and well-developed, well-nourished, and in no distress. No distress.  HENT:  Head: Normocephalic and atraumatic.  Eyes: Conjunctivae normal are normal.  Neck: Neck supple. No thyromegaly present.  Cardiovascular: Normal rate, regular rhythm and normal heart sounds.   Pulmonary/Chest: Effort normal and breath sounds normal. She has no wheezes.  Abdominal: She exhibits no distension and no mass.  Musculoskeletal: She exhibits no edema.  Lymphadenopathy:    She has no cervical adenopathy.  Neurological: She is alert and oriented to person, place, and time.  Skin: Skin is warm and dry. No rash noted. She is not diaphoretic.  Psychiatric: Memory, affect and judgment normal.    Lab Results  Component Value Date   TSH 0.91 05/16/2011   Lab Results  Component Value Date   WBC 7.0 05/16/2011   HGB 14.4 05/16/2011   HCT 44.3 05/16/2011   MCV 91.8 05/16/2011   PLT 239.0 05/16/2011   Lab Results  Component Value Date   CREATININE 1.0 05/16/2011   BUN 14 05/16/2011   NA 142 05/16/2011   K 4.6 05/16/2011   CL 107 05/16/2011   CO2 26 05/16/2011   Lab  Results  Component Value Date   ALT 20 05/16/2011   AST 26 05/16/2011   ALKPHOS 55 05/16/2011   BILITOT 0.6 05/16/2011   Lab Results  Component Value Date   CHOL  Value: 221  ATP III CLASSIFICATION:  <200     mg/dL   Desirable  409-811  mg/dL   Borderline High  >=914    mg/dL   High       * 7/82/9562   Lab Results  Component Value Date   HDL 76 06/19/2009   Lab Results  Component Value Date   LDLCALC  Value: 125        Total Cholesterol/HDL:CHD Risk Coronary Heart Disease Risk Table                     Men   Women  1/2 Average Risk   3.4   3.3  Average Risk       5.0   4.4  2 X Average Risk   9.6   7.1  3 X Average Risk  23.4   11.0        Use the calculated Patient Ratio above and the CHD Risk Table to determine the patient's CHD Risk.        ATP III CLASSIFICATION (LDL):  <100     mg/dL   Optimal  130-865  mg/dL   Near or Above                    Optimal  130-159  mg/dL   Borderline  784-696  mg/dL   High  >295     mg/dL   Very High* 2/84/1324   Lab Results  Component Value Date   TRIG 101 06/19/2009   Lab Results  Component Value Date   CHOLHDL 2.9 06/19/2009     Assessment & Plan  UTI (lower urinary tract infection) No concerning symptoms today and she does not feel she can give a sample in the office. She is sent home with a sterile collection kit and if she can bring a sample or her son can when she develops symptoms. They are instructed to call the office and tell us if they feel they need to bring in a sample  Hypertension Adequately controlled at this visit, no concerning symptoms  Hyperlipidemia Check lipid panel at this time and avoid trans fats, continue Krill oil caps daily  Hyponatremia Mild  In past, check renal panel today  Sacroiliac dysfunction Left posterior hip pain, recommended Tylenol 500 mg po bid to tid, increased physical activity and to call for topical cream if no improvement. Is allergic to NSAIDs so would have to give cream without

## 2011-11-13 NOTE — Progress Notes (Signed)
Quick Note:  Patient Informed and voiced understanding ______ 

## 2012-06-04 ENCOUNTER — Ambulatory Visit: Payer: Medicare Other | Admitting: Family Medicine

## 2012-06-10 ENCOUNTER — Ambulatory Visit: Payer: Medicare Other | Admitting: Family Medicine

## 2012-07-16 ENCOUNTER — Encounter: Payer: Self-pay | Admitting: Cardiology

## 2014-09-20 ENCOUNTER — Emergency Department (HOSPITAL_COMMUNITY)
Admission: EM | Admit: 2014-09-20 | Discharge: 2014-09-21 | Disposition: A | Discharge status: Home / Self Care | Payer: Medicare Other | Attending: Emergency Medicine | Admitting: Emergency Medicine

## 2014-09-20 ENCOUNTER — Encounter (HOSPITAL_COMMUNITY): Payer: Self-pay | Admitting: Emergency Medicine

## 2014-09-20 ENCOUNTER — Emergency Department (HOSPITAL_COMMUNITY): Payer: Medicare Other

## 2014-09-20 DIAGNOSIS — M549 Dorsalgia, unspecified: Secondary | ICD-10-CM

## 2014-09-20 DIAGNOSIS — Z87440 Personal history of urinary (tract) infections: Secondary | ICD-10-CM | POA: Diagnosis not present

## 2014-09-20 DIAGNOSIS — Z872 Personal history of diseases of the skin and subcutaneous tissue: Secondary | ICD-10-CM | POA: Insufficient documentation

## 2014-09-20 DIAGNOSIS — K5901 Slow transit constipation: Secondary | ICD-10-CM | POA: Diagnosis not present

## 2014-09-20 DIAGNOSIS — Z85828 Personal history of other malignant neoplasm of skin: Secondary | ICD-10-CM | POA: Insufficient documentation

## 2014-09-20 DIAGNOSIS — I10 Essential (primary) hypertension: Secondary | ICD-10-CM | POA: Insufficient documentation

## 2014-09-20 DIAGNOSIS — Z87828 Personal history of other (healed) physical injury and trauma: Secondary | ICD-10-CM | POA: Insufficient documentation

## 2014-09-20 DIAGNOSIS — Z79899 Other long term (current) drug therapy: Secondary | ICD-10-CM | POA: Diagnosis not present

## 2014-09-20 DIAGNOSIS — Z87891 Personal history of nicotine dependence: Secondary | ICD-10-CM | POA: Insufficient documentation

## 2014-09-20 DIAGNOSIS — Z8619 Personal history of other infectious and parasitic diseases: Secondary | ICD-10-CM | POA: Diagnosis not present

## 2014-09-20 DIAGNOSIS — M1990 Unspecified osteoarthritis, unspecified site: Secondary | ICD-10-CM | POA: Diagnosis not present

## 2014-09-20 DIAGNOSIS — Z8639 Personal history of other endocrine, nutritional and metabolic disease: Secondary | ICD-10-CM | POA: Insufficient documentation

## 2014-09-20 DIAGNOSIS — K5900 Constipation, unspecified: Secondary | ICD-10-CM | POA: Diagnosis present

## 2014-09-20 DIAGNOSIS — M545 Low back pain: Secondary | ICD-10-CM | POA: Diagnosis not present

## 2014-09-20 LAB — CBC WITH DIFFERENTIAL/PLATELET
BASOS ABS: 0 10*3/uL (ref 0.0–0.1)
BASOS PCT: 1 % (ref 0–1)
Eosinophils Absolute: 0 10*3/uL (ref 0.0–0.7)
Eosinophils Relative: 1 % (ref 0–5)
HEMATOCRIT: 42.1 % (ref 36.0–46.0)
HEMOGLOBIN: 13.7 g/dL (ref 12.0–15.0)
Lymphocytes Relative: 28 % (ref 12–46)
Lymphs Abs: 1.6 10*3/uL (ref 0.7–4.0)
MCH: 29.7 pg (ref 26.0–34.0)
MCHC: 32.5 g/dL (ref 30.0–36.0)
MCV: 91.3 fL (ref 78.0–100.0)
Monocytes Absolute: 0.6 10*3/uL (ref 0.1–1.0)
Monocytes Relative: 10 % (ref 3–12)
NEUTROS ABS: 3.4 10*3/uL (ref 1.7–7.7)
NEUTROS PCT: 60 % (ref 43–77)
Platelets: 192 10*3/uL (ref 150–400)
RBC: 4.61 MIL/uL (ref 3.87–5.11)
RDW: 13.6 % (ref 11.5–15.5)
WBC: 5.6 10*3/uL (ref 4.0–10.5)

## 2014-09-20 LAB — URINALYSIS, ROUTINE W REFLEX MICROSCOPIC
Bilirubin Urine: NEGATIVE
GLUCOSE, UA: NEGATIVE mg/dL
Hgb urine dipstick: NEGATIVE
Ketones, ur: NEGATIVE mg/dL
Nitrite: NEGATIVE
PROTEIN: NEGATIVE mg/dL
Specific Gravity, Urine: 1.005 (ref 1.005–1.030)
Urobilinogen, UA: 0.2 mg/dL (ref 0.0–1.0)
pH: 7 (ref 5.0–8.0)

## 2014-09-20 LAB — BASIC METABOLIC PANEL
ANION GAP: 9 (ref 5–15)
BUN: 10 mg/dL (ref 6–20)
CALCIUM: 9.3 mg/dL (ref 8.9–10.3)
CHLORIDE: 106 mmol/L (ref 101–111)
CO2: 25 mmol/L (ref 22–32)
Creatinine, Ser: 0.94 mg/dL (ref 0.44–1.00)
GFR calc non Af Amer: 53 mL/min — ABNORMAL LOW (ref >60)
Glucose, Bld: 97 mg/dL (ref 65–99)
Potassium: 4 mmol/L (ref 3.5–5.1)
SODIUM: 140 mmol/L (ref 135–145)

## 2014-09-20 LAB — URINE MICROSCOPIC-ADD ON

## 2014-09-20 MED ORDER — POLYETHYLENE GLYCOL 3350 17 GM/SCOOP PO POWD: ORAL | Status: DC

## 2014-09-20 MED ORDER — ACETAMINOPHEN 325 MG PO TABS
650.0000 mg | ORAL_TABLET | Freq: Once | ORAL | Status: AC
  Administered 2014-09-20: 650 mg via ORAL
  Filled 2014-09-20: qty 2

## 2014-09-20 NOTE — Discharge Instructions (Signed)
Back Exercises Back exercises help treat and prevent back injuries. The goal of back exercises is to increase the strength of your abdominal and back muscles and the flexibility of your back. These exercises should be started when you no longer have back pain. Back exercises include:  Pelvic Tilt. Lie on your back with your knees bent. Tilt your pelvis until the lower part of your back is against the floor. Hold this position 5 to 10 sec and repeat 5 to 10 times.  Knee to Chest. Pull first 1 knee up against your chest and hold for 20 to 30 seconds, repeat this with the other knee, and then both knees. This may be done with the other leg straight or bent, whichever feels better.  Sit-Ups or Curl-Ups. Bend your knees 90 degrees. Start with tilting your pelvis, and do a partial, slow sit-up, lifting your trunk only 30 to 45 degrees off the floor. Take at least 2 to 3 seconds for each sit-up. Do not do sit-ups with your knees out straight. If partial sit-ups are difficult, simply do the above but with only tightening your abdominal muscles and holding it as directed.  Hip-Lift. Lie on your back with your knees flexed 90 degrees. Push down with your feet and shoulders as you raise your hips a couple inches off the floor; hold for 10 seconds, repeat 5 to 10 times.  Back arches. Lie on your stomach, propping yourself up on bent elbows. Slowly press on your hands, causing an arch in your low back. Repeat 3 to 5 times. Any initial stiffness and discomfort should lessen with repetition over time.  Shoulder-Lifts. Lie face down with arms beside your body. Keep hips and torso pressed to floor as you slowly lift your head and shoulders off the floor. Do not overdo your exercises, especially in the beginning. Exercises may cause you some mild back discomfort which lasts for a few minutes; however, if the pain is more severe, or lasts for more than 15 minutes, do not continue exercises until you see your caregiver.  Improvement with exercise therapy for back problems is slow.  See your caregivers for assistance with developing a proper back exercise program. Document Released: 02/29/2004 Document Revised: 04/15/2011 Document Reviewed: 11/22/2010 [facility] Patient Information 2015 [place], [place]. This information is not intended to replace advice given to you by your health care provider. Make sure you discuss any questions you have with your health care provider. Constipation Constipation is when a person has fewer than three bowel movements a week, has difficulty having a bowel movement, or has stools that are dry, hard, or larger than normal. As people grow older, constipation is more common. If you try to fix constipation with medicines that make you have a bowel movement (laxatives), the problem may get worse. Long-term laxative use may cause the muscles of the colon to become weak. A low-fiber diet, not taking in enough fluids, and taking certain medicines may make constipation worse.  CAUSES   Certain medicines, such as antidepressants, pain medicine, iron supplements, antacids, and water pills.   Certain diseases, such as diabetes, irritable bowel syndrome (IBS), thyroid disease, or depression.   Not drinking enough water.   Not eating enough fiber-rich foods.   Stress or travel.   Lack of physical activity or exercise.   Ignoring the urge to have a bowel movement.   Using laxatives too much.  SIGNS AND SYMPTOMS   Having fewer than three bowel movements a week.   Straining  to have a bowel movement.   Having stools that are hard, dry, or larger than normal.   Feeling full or bloated.   Pain in the lower abdomen.   Not feeling relief after having a bowel movement.  DIAGNOSIS  Your health care provider will take a medical history and perform a physical exam. Further testing may be done for severe constipation. Some tests may include:  A barium enema X-ray to examine your  rectum, colon, and, sometimes, your small intestine.   A sigmoidoscopy to examine your lower colon.   A colonoscopy to examine your entire colon. TREATMENT  Treatment will depend on the severity of your constipation and what is causing it. Some dietary treatments include drinking more fluids and eating more fiber-rich foods. Lifestyle treatments may include regular exercise. If these diet and lifestyle recommendations do not help, your health care provider may recommend taking over-the-counter laxative medicines to help you have bowel movements. Prescription medicines may be prescribed if over-the-counter medicines do not work.  HOME CARE INSTRUCTIONS   Eat foods that have a lot of fiber, such as fruits, vegetables, whole grains, and beans.  Limit foods high in fat and processed sugars, such as french fries, hamburgers, cookies, candies, and soda.   A fiber supplement may be added to your diet if you cannot get enough fiber from foods.   Drink enough fluids to keep your urine clear or pale yellow.   Exercise regularly or as directed by your health care provider.   Go to the restroom when you have the urge to go. Do not hold it.   Only take over-the-counter or prescription medicines as directed by your health care provider. Do not take other medicines for constipation without talking to your health care provider first.  [place] IF:   You have bright red blood in your stool.   Your constipation lasts for more than 4 days or gets worse.   You have abdominal or rectal pain.   You have thin, pencil-like stools.   You have unexplained weight loss. MAKE SURE YOU:   Understand these instructions.  Will watch your condition.  Will get help right away if you are not doing well or get worse. Document Released: 10/20/2003 Document Revised: 01/26/2013 Document Reviewed: 11/02/2012 [facility] Patient Information 2015 [place], [place]. This information is not  intended to replace advice given to you by your health care provider. Make sure you discuss any questions you have with your health care provider.

## 2014-09-20 NOTE — ED Notes (Signed)
Introduced myself to pt and this Probation officer at bedside while MD did rectal exam

## 2014-09-20 NOTE — ED Notes (Signed)
Pt going to MRI at this time.

## 2014-09-20 NOTE — ED Provider Notes (Signed)
CSN: [id number]     Arrival date & time 09/20/14  1352 History   First MD Initiated Contact with Patient 09/20/14 1720     Chief Complaint  Patient presents with  . Back Pain  . Constipation  . Urinary Incontinence     (Consider location/radiation/quality/duration/timing/severity/associated sxs/prior Treatment) Patient is a 81 y.o. female presenting with back pain and constipation. The history is provided by the patient.  Back Pain Location:  Lumbar spine Quality:  Aching Radiates to:  Does not radiate Pain severity:  Moderate Onset quality:  Gradual Duration:  1 week Timing:  Constant Progression:  Worsening Context comment:  Constipation last BM 4 days ago, small, hard Relieved by:  Nothing Worsened by:  Nothing tried Ineffective treatments:  None tried Associated symptoms: bladder incontinence   Associated symptoms: no abdominal pain, no dysuria, no fever, no numbness and no weight loss   Associated symptoms comment:  Loss of appetite Constipation Associated symptoms: back pain   Associated symptoms: no abdominal pain, no dysuria and no fever     Past Medical History  Diagnosis Date  . Psoriasis     scalp  . Cancer     basal cells on back, shoulder and nose  . Chicken pox as a child  . Mumps as a child  . Measles as a child  . Hypertension   . Fainting spell     X 1 time, at home in hospital 3 days  . Broken leg 1996  . Arthritis     cervical, spurs  . Neck pain, chronic   . History of shingles   . Seborrhea capitis   . Seborrheic keratosis   . Pericarditis   . Back pain 05/16/2011  . DOE (dyspnea on exertion) 05/16/2011  . Preventative health care 05/16/2011  . Hyperlipidemia 06/19/2011  . UTI (lower urinary tract infection) 06/23/2011  . Hyponatremia 11/12/2011  . Sacroiliac dysfunction 11/12/2011   Past Surgical History  Procedure Laterality Date  . Paracarditice  1981      . Back disc repair  1979    low back with LLE radiculopathy  . Double knee  replacement  1990  . Anaphalactic shock  1990    due to drug Voltaren  . Breast lumpectomy  1947    left  . Cataract extraction, bilateral    . Tubal ligation    . Biopsy left leg  10-23-11    benign   Family History  Problem Relation Age of Onset  . Cancer Mother     ovarian  . Cancer Father     pancreatic  . Heart disease Son   . Diabetes Son     type 2  . COPD Son     smoked  . Emphysema Son    Social History  Substance Use Topics  . Smoking status: Former Smoker -- 41 years    Types: Cigarettes    Quit date: 02/05/1996  . Smokeless tobacco: Never Used  . Alcohol Use: No   OB History    No data available     Review of Systems  Constitutional: Negative for fever and weight loss.  Gastrointestinal: Positive for constipation. Negative for abdominal pain.  Genitourinary: Positive for bladder incontinence. Negative for dysuria.  Musculoskeletal: Positive for back pain.  Neurological: Negative for numbness.  All other systems reviewed and are negative.     Allergies  Diclofenac sodium; Ibuprofen; Lisinopril; Adhesive; and Codeine  Home Medications   Prior to Admission medications  Medication Sig Start Date End Date Taking? Authorizing Provider  acetaminophen (TYLENOL) 500 MG tablet Take 500-1,000 mg by mouth every 6 (six) hours as needed for mild pain, moderate pain or headache.    Yes Historical Provider, MD  amLODipine (NORVASC) 5 MG tablet Take 1 tablet (5 mg total) by mouth daily. 05/16/11  Yes Mosie Lukes, MD  polyethylene glycol powder (MIRALAX) powder TAKE 6 CAPFULS OF MIRALAX IN A 32 OUNCE GATORADE AND DRINK THE WHOLE BEVERAGE FOLLOWED BY 3 CAPFULS TWICE A DAY FOR THE NEXT WEEK AND FOLLOW UP WITH YOUR PRIMARY CARE PHYSICIAN. 09/20/14   Leo Grosser, MD   BP 177/85 mmHg  Pulse 93  Temp(Src) 98.3 F (36.8 C) (Oral)  Resp 19  SpO2 95% Physical Exam  Constitutional: She is oriented to person, place, and time. She appears well-developed and  well-nourished. No distress.  HENT:  Head: Normocephalic.  Eyes: Conjunctivae are normal.  Neck: Neck supple. No tracheal deviation present.  Cardiovascular: Normal rate and regular rhythm.   Pulmonary/Chest: Effort normal. No respiratory distress.  Abdominal: Soft. Normal appearance. She exhibits no distension. There is no tenderness. There is no CVA tenderness.  Genitourinary: Rectum normal.  Musculoskeletal:       Thoracic back: She exhibits tenderness.       Lumbar back: She exhibits tenderness.  Neurological: She is alert and oriented to person, place, and time.  Skin: Skin is warm and dry.  Psychiatric: She has a normal mood and affect.    ED Course  Procedures (including critical care time) Labs Review Labs Reviewed  URINALYSIS, ROUTINE W REFLEX MICROSCOPIC (NOT AT [facility]) - Abnormal; Notable for the following:    APPearance CLOUDY (*)    Leukocytes, UA TRACE (*)    All other components within normal limits  BASIC METABOLIC PANEL - Abnormal; Notable for the following:    GFR calc non Af Amer 53 (*)    All other components within normal limits  URINE CULTURE  URINE MICROSCOPIC-ADD ON  CBC WITH DIFFERENTIAL/PLATELET    Imaging Review Mr Thoracic Spine Wo Contrast  09/20/2014   CLINICAL DATA:  Initial evaluation for acute back pain. Urinary incontinence.  EXAM: MRI THORACIC AND LUMBAR SPINE WITHOUT CONTRAST  TECHNIQUE: Multiplanar and multiecho pulse sequences of the thoracic and lumbar spine were obtained without intravenous contrast.  COMPARISON:  None.  FINDINGS: MR THORACIC SPINE FINDINGS  Vertebral bodies are normally aligned with preservation of the normal thoracic kyphosis. No listhesis.  There is abnormal T1 hypo intense signal intensity with T2 hyperintense signal intensity extending through the mid and inferior aspect of the T11 vertebral body, consistent with an acute compression type fracture deformity. There is mild concavity at the inferior endplate of X38 with  approximately 25-30% of central height loss. No bony retropulsion or epidural hematoma. Mild edema within the adjacent paravertebral soft tissues. Adjacent T11-12 intervertebral disc is normal in appearance without convincing evidence for active infection. No other fracture. Vertebral body heights otherwise maintained. Bone marrow signal intensity otherwise normal.  Signal intensity within the thoracic spinal cord is normal. No cord compression or significant canal stenosis.  Degenerative disc bulging noted at the C6-7 level with resultant mild canal stenosis, incompletely evaluated on this exam. Tiny central disc protrusions present at T5-6, T6-7, and T7-8 without stenosis. Mild disc bulge at the T11-12 level with superimposed facet arthrosis. There is mild canal narrowing at this level. No significant foraminal stenosis. No other significant degenerative changes within the thoracic spine.  Other than the above-mentioned prevertebral edema at the level of the fracture, the paraspinous soft tissues within normal limits. Probable small sebaceous cyst within the subcutaneous fat of the upper back. T2 hyperintense cyst noted within the right kidney.  MR LUMBAR SPINE FINDINGS  Mild dextroscoliosis present with apex at L2-3. Vertebral bodies otherwise normally aligned with preservation of the normal lumbar lordosis. Previously noted T11 compression type fracture partially visualized. Vertebral body heights are otherwise maintained. Signal intensity within the vertebral body bone marrow otherwise normal. No other acute fracture or listhesis.  Conus medullaris terminates normally at the L1-2 level. Signal intensity within the visualized cord is normal. No cord compression.  Fatty atrophy noted within the paraspinous musculature. Paraspinous soft tissues otherwise unremarkable. Scattered T2 hyperintense cysts noted within the kidneys bilaterally.  T12-L1:  Negative.  L1-2: Degenerative disc bulge with disc desiccation and  mild intervertebral disc space narrowing. Bilateral facet arthrosis and ligamentum flavum hypertrophy. No focal disc herniation. There is resultant mild canal and lateral recess stenosis bilaterally. Mild bilateral foraminal narrowing present as well.  L2-3: Mild disc bulge with disc desiccation. Anterior endplate osteophytic spurring. Mild facet and ligamentous hypertrophy. No significant canal or foraminal stenosis.  L3-4: Disc desiccation with mild diffuse annular disc bulge, slightly eccentric to the left. No focal disc herniation. Mild facet and ligamentous hypertrophy. Resultant mild left lateral recess stenosis without significant canal narrowing. Mild left foraminal narrowing related to disc bulge and facet disease. No significant right foraminal narrowing.  L4-5: Mild disc bulge with disc desiccation. Superimposed facet and ligamentous hypertrophy. Resultant mild canal narrowing at this level. No significant foraminal stenosis.  L5-S1: Diffuse degenerative disc bulge with disc desiccation. Shallow broad-based posterior disc protrusion with associated annular fissure. Mild facet hypertrophy. No significant canal or foraminal stenosis. The protruding disc closely approximates the transiting S1 nerve roots in the lateral recesses bilaterally without frank neural impingement.  IMPRESSION: MR THORACIC SPINE IMPRESSION  1. No cord compression or severe canal stenosis within the thoracic spine. 2. Acute compression type fracture involving the T11 mid and lower T11 vertebral body with approximately 30% of central height loss at the T11 inferior endplate. No bony retropulsion. 3. Disc bulge with facet arthrosis at T11-12 with resultant mild canal narrowing. 4. Small central disc protrusions at T5-6, T6-7, and T7-8 without stenosis.  MR LUMBAR SPINE IMPRESSION  1. No cord compression or severe canal stenosis within the lumbar spine. 2. Mild multilevel degenerative spondylolysis as detailed above without significant  canal or foraminal stenosis.   Electronically Signed   By: Jeannine Boga M.D.   On: 09/20/2014 22:50   Mr Lumbar Spine Wo Contrast  09/20/2014   CLINICAL DATA:  Initial evaluation for acute back pain. Urinary incontinence.  EXAM: MRI THORACIC AND LUMBAR SPINE WITHOUT CONTRAST  TECHNIQUE: Multiplanar and multiecho pulse sequences of the thoracic and lumbar spine were obtained without intravenous contrast.  COMPARISON:  None.  FINDINGS: MR THORACIC SPINE FINDINGS  Vertebral bodies are normally aligned with preservation of the normal thoracic kyphosis. No listhesis.  There is abnormal T1 hypo intense signal intensity with T2 hyperintense signal intensity extending through the mid and inferior aspect of the T11 vertebral body, consistent with an acute compression type fracture deformity. There is mild concavity at the inferior endplate of T46 with approximately 25-30% of central height loss. No bony retropulsion or epidural hematoma. Mild edema within the adjacent paravertebral soft tissues. Adjacent T11-12 intervertebral disc is normal in appearance without convincing evidence for active  infection. No other fracture. Vertebral body heights otherwise maintained. Bone marrow signal intensity otherwise normal.  Signal intensity within the thoracic spinal cord is normal. No cord compression or significant canal stenosis.  Degenerative disc bulging noted at the C6-7 level with resultant mild canal stenosis, incompletely evaluated on this exam. Tiny central disc protrusions present at T5-6, T6-7, and T7-8 without stenosis. Mild disc bulge at the T11-12 level with superimposed facet arthrosis. There is mild canal narrowing at this level. No significant foraminal stenosis. No other significant degenerative changes within the thoracic spine.  Other than the above-mentioned prevertebral edema at the level of the fracture, the paraspinous soft tissues within normal limits. Probable small sebaceous cyst within the  subcutaneous fat of the upper back. T2 hyperintense cyst noted within the right kidney.  MR LUMBAR SPINE FINDINGS  Mild dextroscoliosis present with apex at L2-3. Vertebral bodies otherwise normally aligned with preservation of the normal lumbar lordosis. Previously noted T11 compression type fracture partially visualized. Vertebral body heights are otherwise maintained. Signal intensity within the vertebral body bone marrow otherwise normal. No other acute fracture or listhesis.  Conus medullaris terminates normally at the L1-2 level. Signal intensity within the visualized cord is normal. No cord compression.  Fatty atrophy noted within the paraspinous musculature. Paraspinous soft tissues otherwise unremarkable. Scattered T2 hyperintense cysts noted within the kidneys bilaterally.  T12-L1:  Negative.  L1-2: Degenerative disc bulge with disc desiccation and mild intervertebral disc space narrowing. Bilateral facet arthrosis and ligamentum flavum hypertrophy. No focal disc herniation. There is resultant mild canal and lateral recess stenosis bilaterally. Mild bilateral foraminal narrowing present as well.  L2-3: Mild disc bulge with disc desiccation. Anterior endplate osteophytic spurring. Mild facet and ligamentous hypertrophy. No significant canal or foraminal stenosis.  L3-4: Disc desiccation with mild diffuse annular disc bulge, slightly eccentric to the left. No focal disc herniation. Mild facet and ligamentous hypertrophy. Resultant mild left lateral recess stenosis without significant canal narrowing. Mild left foraminal narrowing related to disc bulge and facet disease. No significant right foraminal narrowing.  L4-5: Mild disc bulge with disc desiccation. Superimposed facet and ligamentous hypertrophy. Resultant mild canal narrowing at this level. No significant foraminal stenosis.  L5-S1: Diffuse degenerative disc bulge with disc desiccation. Shallow broad-based posterior disc protrusion with associated  annular fissure. Mild facet hypertrophy. No significant canal or foraminal stenosis. The protruding disc closely approximates the transiting S1 nerve roots in the lateral recesses bilaterally without frank neural impingement.  IMPRESSION: MR THORACIC SPINE IMPRESSION  1. No cord compression or severe canal stenosis within the thoracic spine. 2. Acute compression type fracture involving the T11 mid and lower T11 vertebral body with approximately 30% of central height loss at the T11 inferior endplate. No bony retropulsion. 3. Disc bulge with facet arthrosis at T11-12 with resultant mild canal narrowing. 4. Small central disc protrusions at T5-6, T6-7, and T7-8 without stenosis.  MR LUMBAR SPINE IMPRESSION  1. No cord compression or severe canal stenosis within the lumbar spine. 2. Mild multilevel degenerative spondylolysis as detailed above without significant canal or foraminal stenosis.   Electronically Signed   By: Jeannine Boga M.D.   On: 09/20/2014 22:50   I have personally reviewed and evaluated these images and lab results as part of my medical decision-making.   EKG Interpretation None      MDM   Final diagnoses:  Back pain  Slow transit constipation   81 year old female presenting with mid and low back pain that has been ongoing  for the last week. She has noticed that she's had some urgency with incontinence over the last 3 days that followed and has had some ongoing constipation issues. She has no sensory deficits and saddle distribution, no hard stool in the vault to suggest element of obstruction and has a normal postvoid residual. She has no evidence of urinary tract infection and she ambulates appropriately but given timing of symptoms, age, and midline tenderness of T and L-spine MRI was ordered to evaluate for cord or foraminal stenosis/cauda equina.  No acute eyebrow eyes were found on thoracic or lumbar spine, there is no indication for inpatient management of this issue. She  was provided a prescription for MiraLAX to help with feeling of constipation and recommended that she follow up with her primary care physician. Urine was sent for culture with small leukocytes present but low clinical suspicion so will not treat empirically at this time.  Leo Grosser, MD 09/21/14 [phone]

## 2014-09-20 NOTE — ED Notes (Signed)
Report received from [facility],  Pt has burn marks on back per report from [place] because pt states she she used a heating pad on it for pain

## 2014-09-20 NOTE — ED Notes (Signed)
Urine in lab,  Slip sent down for culture

## 2014-09-20 NOTE — ED Notes (Signed)
Pt c/o back pain x week.  Pt also states that she hasnt had a BM since Saturday morning.  Pt now having urinary incontinence about 3 days ago.   Pt states that she gets up to go to the bathroom and starts voiding before she can get there.  Pt states that she has had heat from pad trying to help her back pain but not working.  Pt been taking stool softener but has stopped working per pt's son.

## 2014-09-23 LAB — URINE CULTURE

## 2014-09-24 ENCOUNTER — Telehealth (HOSPITAL_BASED_OUTPATIENT_CLINIC_OR_DEPARTMENT_OTHER): Payer: Self-pay | Admitting: Emergency Medicine

## 2014-09-24 NOTE — Progress Notes (Cosign Needed)
ED Antimicrobial Stewardship Positive Culture Follow Up   [name] is an 81 y.o. female who presented to [facility] on 09/20/2014 with a chief complaint of  Chief Complaint  Patient presents with  . Back Pain  . Constipation  . Urinary Incontinence    Recent Results (from the past 720 hour(s))  Urine culture     Status: None   Collection Time: 09/20/14 11:11 PM  Result Value Ref Range Status   Specimen Description URINE, CLEAN CATCH  Final   Special Requests NONE  Final   Culture   Final    >=100,000 COLONIES/mL ESCHERICHIA COLI Performed at [facility]    Report Status 09/23/2014 FINAL  Final   Organism ID, Bacteria ESCHERICHIA COLI  Final      Susceptibility   Escherichia coli - MIC*    AMPICILLIN 8 SENSITIVE Sensitive     CEFAZOLIN <=4 SENSITIVE Sensitive     CEFTRIAXONE <=1 SENSITIVE Sensitive     CIPROFLOXACIN <=0.25 SENSITIVE Sensitive     GENTAMICIN <=1 SENSITIVE Sensitive     IMIPENEM <=0.25 SENSITIVE Sensitive     NITROFURANTOIN <=16 SENSITIVE Sensitive     TRIMETH/SULFA <=20 SENSITIVE Sensitive     AMPICILLIN/SULBACTAM 4 SENSITIVE Sensitive     * >=100,000 COLONIES/mL ESCHERICHIA COLI    [x]  Patient discharged originally without antimicrobial agent and treatment is now indicated  New antibiotic prescription: Keflex 500 mg po BID x 5 days  ED Provider: Raiford Simmonds, PA   Tad Moore 09/24/2014, 8:58 AM Infectious Diseases Pharmacist Phone# [phone]

## 2014-09-24 NOTE — Telephone Encounter (Signed)
Post ED Visit - Positive Culture Follow-up: Successful Patient Follow-Up  Culture assessed and recommendations reviewed by: []  Heide Guile, Pharm.D., BCPS-AQ ID []  Alycia Rossetti, Pharm.D., BCPS []  [place], Pharm.D., BCPS, AAHIVP []  Legrand Como, Pharm.D., BCPS, AAHIVP []  Tegan Magsam, Pharm.D. []  [employer], [place].D. Anette Guarneri PharmD  Positive urine culture E. coli  [x]  Patient discharged without antimicrobial prescription and treatment is now indicated []  Organism is resistant to prescribed ED discharge antimicrobial []  Patient with positive blood cultures  Changes discussed with ED provider: Carlisle Cater PA New antibiotic prescription Keflex 500 mg po bid x 5 days Called to [place] patient, 09/24/2014 1117   Hazle Nordmann 09/24/2014, 11:16 AM

## 2014-09-26 ENCOUNTER — Encounter: Payer: Self-pay | Admitting: Nurse Practitioner

## 2014-09-27 ENCOUNTER — Inpatient Hospital Stay (HOSPITAL_COMMUNITY)
Admission: EM | Admit: 2014-09-27 | Discharge: 2014-10-01 | DRG: 543 | Disposition: A | Discharge status: Home Health | Payer: Medicare Other | Attending: Internal Medicine | Admitting: Internal Medicine

## 2014-09-27 ENCOUNTER — Encounter (HOSPITAL_COMMUNITY): Payer: Self-pay

## 2014-09-27 ENCOUNTER — Emergency Department (HOSPITAL_COMMUNITY): Payer: Medicare Other

## 2014-09-27 ENCOUNTER — Inpatient Hospital Stay (HOSPITAL_COMMUNITY): Payer: Medicare Other

## 2014-09-27 DIAGNOSIS — I10 Essential (primary) hypertension: Secondary | ICD-10-CM | POA: Diagnosis not present

## 2014-09-27 DIAGNOSIS — Z8619 Personal history of other infectious and parasitic diseases: Secondary | ICD-10-CM

## 2014-09-27 DIAGNOSIS — S22000A Wedge compression fracture of unspecified thoracic vertebra, initial encounter for closed fracture: Secondary | ICD-10-CM | POA: Diagnosis present

## 2014-09-27 DIAGNOSIS — M4854XA Collapsed vertebra, not elsewhere classified, thoracic region, initial encounter for fracture: Secondary | ICD-10-CM | POA: Diagnosis not present

## 2014-09-27 DIAGNOSIS — M549 Dorsalgia, unspecified: Secondary | ICD-10-CM | POA: Diagnosis not present

## 2014-09-27 DIAGNOSIS — Z87440 Personal history of urinary (tract) infections: Secondary | ICD-10-CM

## 2014-09-27 DIAGNOSIS — Z79899 Other long term (current) drug therapy: Secondary | ICD-10-CM

## 2014-09-27 DIAGNOSIS — Z833 Family history of diabetes mellitus: Secondary | ICD-10-CM

## 2014-09-27 DIAGNOSIS — M545 Low back pain: Secondary | ICD-10-CM | POA: Diagnosis present

## 2014-09-27 DIAGNOSIS — E440 Moderate protein-calorie malnutrition: Secondary | ICD-10-CM | POA: Insufficient documentation

## 2014-09-27 DIAGNOSIS — K560 Paralytic ileus: Secondary | ICD-10-CM | POA: Diagnosis present

## 2014-09-27 DIAGNOSIS — K5900 Constipation, unspecified: Secondary | ICD-10-CM | POA: Diagnosis not present

## 2014-09-27 DIAGNOSIS — R109 Unspecified abdominal pain: Secondary | ICD-10-CM

## 2014-09-27 DIAGNOSIS — Z886 Allergy status to analgesic agent status: Secondary | ICD-10-CM

## 2014-09-27 DIAGNOSIS — X58XXXA Exposure to other specified factors, initial encounter: Secondary | ICD-10-CM | POA: Diagnosis present

## 2014-09-27 DIAGNOSIS — S22000S Wedge compression fracture of unspecified thoracic vertebra, sequela: Secondary | ICD-10-CM

## 2014-09-27 DIAGNOSIS — Z87891 Personal history of nicotine dependence: Secondary | ICD-10-CM

## 2014-09-27 DIAGNOSIS — Z8041 Family history of malignant neoplasm of ovary: Secondary | ICD-10-CM

## 2014-09-27 DIAGNOSIS — Z885 Allergy status to narcotic agent status: Secondary | ICD-10-CM

## 2014-09-27 DIAGNOSIS — E785 Hyperlipidemia, unspecified: Secondary | ICD-10-CM

## 2014-09-27 DIAGNOSIS — K567 Ileus, unspecified: Secondary | ICD-10-CM

## 2014-09-27 DIAGNOSIS — K639 Disease of intestine, unspecified: Secondary | ICD-10-CM

## 2014-09-27 DIAGNOSIS — R1084 Generalized abdominal pain: Secondary | ICD-10-CM | POA: Diagnosis not present

## 2014-09-27 DIAGNOSIS — R935 Abnormal findings on diagnostic imaging of other abdominal regions, including retroperitoneum: Secondary | ICD-10-CM | POA: Insufficient documentation

## 2014-09-27 DIAGNOSIS — Z96659 Presence of unspecified artificial knee joint: Secondary | ICD-10-CM | POA: Diagnosis present

## 2014-09-27 DIAGNOSIS — Z825 Family history of asthma and other chronic lower respiratory diseases: Secondary | ICD-10-CM

## 2014-09-27 DIAGNOSIS — Z808 Family history of malignant neoplasm of other organs or systems: Secondary | ICD-10-CM

## 2014-09-27 DIAGNOSIS — H9202 Otalgia, left ear: Secondary | ICD-10-CM | POA: Diagnosis present

## 2014-09-27 DIAGNOSIS — M1990 Unspecified osteoarthritis, unspecified site: Secondary | ICD-10-CM | POA: Diagnosis present

## 2014-09-27 DIAGNOSIS — Z888 Allergy status to other drugs, medicaments and biological substances status: Secondary | ICD-10-CM

## 2014-09-27 LAB — URINALYSIS, ROUTINE W REFLEX MICROSCOPIC
Bilirubin Urine: NEGATIVE
Glucose, UA: NEGATIVE mg/dL
Hgb urine dipstick: NEGATIVE
Ketones, ur: NEGATIVE mg/dL
Leukocytes, UA: NEGATIVE
Nitrite: NEGATIVE
Protein, ur: NEGATIVE mg/dL
Specific Gravity, Urine: 1.002 — ABNORMAL LOW (ref 1.005–1.030)
Urobilinogen, UA: 0.2 mg/dL (ref 0.0–1.0)
pH: 6 (ref 5.0–8.0)

## 2014-09-27 LAB — CBC WITH DIFFERENTIAL/PLATELET
Basophils Absolute: 0 10*3/uL (ref 0.0–0.1)
Basophils Relative: 0 % (ref 0–1)
Eosinophils Absolute: 0 10*3/uL (ref 0.0–0.7)
Eosinophils Relative: 1 % (ref 0–5)
HCT: 40.6 % (ref 36.0–46.0)
Hemoglobin: 13.7 g/dL (ref 12.0–15.0)
Lymphocytes Relative: 20 % (ref 12–46)
Lymphs Abs: 1 10*3/uL (ref 0.7–4.0)
MCH: 30.3 pg (ref 26.0–34.0)
MCHC: 33.7 g/dL (ref 30.0–36.0)
MCV: 89.8 fL (ref 78.0–100.0)
Monocytes Absolute: 0.6 10*3/uL (ref 0.1–1.0)
Monocytes Relative: 12 % (ref 3–12)
Neutro Abs: 3.4 10*3/uL (ref 1.7–7.7)
Neutrophils Relative %: 67 % (ref 43–77)
Platelets: 262 10*3/uL (ref 150–400)
RBC: 4.52 MIL/uL (ref 3.87–5.11)
RDW: 13.3 % (ref 11.5–15.5)
WBC: 5 10*3/uL (ref 4.0–10.5)

## 2014-09-27 LAB — COMPREHENSIVE METABOLIC PANEL
ALT: 18 U/L (ref 14–54)
AST: 23 U/L (ref 15–41)
Albumin: 3.8 g/dL (ref 3.5–5.0)
Alkaline Phosphatase: 74 U/L (ref 38–126)
Anion gap: 7 (ref 5–15)
BUN: 7 mg/dL (ref 6–20)
CO2: 24 mmol/L (ref 22–32)
Calcium: 9.2 mg/dL (ref 8.9–10.3)
Chloride: 103 mmol/L (ref 101–111)
Creatinine, Ser: 0.85 mg/dL (ref 0.44–1.00)
GFR calc Af Amer: >60 mL/min (ref >60)
GFR calc non Af Amer: 60 mL/min — ABNORMAL LOW (ref >60)
Glucose, Bld: 98 mg/dL (ref 65–99)
Potassium: 3.6 mmol/L (ref 3.5–5.1)
Sodium: 134 mmol/L — ABNORMAL LOW (ref 135–145)
Total Bilirubin: 0.6 mg/dL (ref 0.3–1.2)
Total Protein: 7.1 g/dL (ref 6.5–8.1)

## 2014-09-27 LAB — I-STAT TROPONIN, ED: Troponin i, poc: 0 ng/mL (ref 0.00–0.08)

## 2014-09-27 LAB — I-STAT BETA HCG BLOOD, ED (MC, WL, AP ONLY)

## 2014-09-27 MED ORDER — ONDANSETRON HCL 4 MG PO TABS: 4.0000 mg | ORAL_TABLET | Freq: Four times a day (QID) | ORAL | Status: DC | PRN

## 2014-09-27 MED ORDER — ACETAMINOPHEN 650 MG RE SUPP: 650.0000 mg | Freq: Four times a day (QID) | RECTAL | Status: DC | PRN

## 2014-09-27 MED ORDER — ACETAMINOPHEN 325 MG PO TABS
650.0000 mg | ORAL_TABLET | Freq: Four times a day (QID) | ORAL | Status: DC | PRN
  Administered 2014-09-27 – 2014-10-01 (×5): 650 mg via ORAL
  Filled 2014-09-27 (×5): qty 2

## 2014-09-27 MED ORDER — ONDANSETRON HCL 4 MG/2ML IJ SOLN
4.0000 mg | Freq: Once | INTRAMUSCULAR | Status: AC
  Administered 2014-09-27: 4 mg via INTRAVENOUS
  Filled 2014-09-27: qty 2

## 2014-09-27 MED ORDER — SODIUM CHLORIDE 0.9 % IV SOLN
INTRAVENOUS | Status: DC
  Administered 2014-09-27 – 2014-09-28 (×2): via INTRAVENOUS

## 2014-09-27 MED ORDER — IOHEXOL 300 MG/ML  SOLN
100.0000 mL | Freq: Once | INTRAMUSCULAR | Status: AC | PRN
  Administered 2014-09-27: 100 mL via INTRAVENOUS

## 2014-09-27 MED ORDER — DOCUSATE SODIUM 100 MG PO CAPS
100.0000 mg | ORAL_CAPSULE | Freq: Two times a day (BID) | ORAL | Status: DC
  Administered 2014-09-27 – 2014-09-30 (×7): 100 mg via ORAL
  Filled 2014-09-27 (×10): qty 1

## 2014-09-27 MED ORDER — BISACODYL 10 MG RE SUPP: 10.0000 mg | Freq: Every day | RECTAL | Status: DC | PRN

## 2014-09-27 MED ORDER — AMLODIPINE BESYLATE 5 MG PO TABS
5.0000 mg | ORAL_TABLET | Freq: Every day | ORAL | Status: DC
  Administered 2014-09-28 – 2014-10-01 (×4): 5 mg via ORAL
  Filled 2014-09-27 (×4): qty 1

## 2014-09-27 MED ORDER — HYDROCODONE-ACETAMINOPHEN 5-325 MG PO TABS
2.0000 | ORAL_TABLET | ORAL | Status: DC | PRN
  Filled 2014-09-27: qty 2

## 2014-09-27 MED ORDER — HEPARIN SODIUM (PORCINE) 5000 UNIT/ML IJ SOLN
5000.0000 [IU] | Freq: Three times a day (TID) | INTRAMUSCULAR | Status: DC
  Administered 2014-09-27 – 2014-09-29 (×5): 5000 [IU] via SUBCUTANEOUS
  Filled 2014-09-27 (×8): qty 1

## 2014-09-27 MED ORDER — HYDROMORPHONE HCL 1 MG/ML IJ SOLN
1.0000 mg | INTRAMUSCULAR | Status: DC | PRN
  Administered 2014-09-27: 1 mg via INTRAVENOUS
  Filled 2014-09-27: qty 1

## 2014-09-27 MED ORDER — ONDANSETRON HCL 4 MG/2ML IJ SOLN: 4.0000 mg | Freq: Four times a day (QID) | INTRAMUSCULAR | Status: DC | PRN

## 2014-09-27 MED ORDER — POLYETHYLENE GLYCOL 3350 17 G PO PACK
17.0000 g | PACK | Freq: Every day | ORAL | Status: DC | PRN
  Filled 2014-09-27: qty 1

## 2014-09-27 MED ORDER — MORPHINE SULFATE (PF) 4 MG/ML IV SOLN
4.0000 mg | Freq: Once | INTRAVENOUS | Status: AC
  Administered 2014-09-27: 4 mg via INTRAVENOUS
  Filled 2014-09-27: qty 1

## 2014-09-27 MED ORDER — IOHEXOL 300 MG/ML  SOLN: 25.0000 mL | Freq: Once | INTRAMUSCULAR | Status: DC | PRN

## 2014-09-27 MED ORDER — WITCH HAZEL-GLYCERIN EX PADS
MEDICATED_PAD | Freq: Two times a day (BID) | CUTANEOUS | Status: DC
  Administered 2014-09-27: 1 via TOPICAL
  Administered 2014-09-28 – 2014-10-01 (×7): via TOPICAL
  Filled 2014-09-27 (×2): qty 100

## 2014-09-27 MED ORDER — ALUM & MAG HYDROXIDE-SIMETH 200-200-20 MG/5ML PO SUSP: 30.0000 mL | Freq: Four times a day (QID) | ORAL | Status: DC | PRN

## 2014-09-27 MED ORDER — ACETAMINOPHEN 500 MG PO TABS: 1000.0000 mg | ORAL_TABLET | ORAL | Status: DC | PRN

## 2014-09-27 NOTE — H&P (Signed)
History and Physical        Hospital Admission Note Date: 09/27/2014  Patient name: [name] Medical record number: [id number] Date of birth: [birth year] Age: 81 y.o. Gender: female  PCP: Tereasa Coop, PA-C  Referring physician: Dr. Zenia Resides  Chief Complaint:  Acute back pain with abdominal pain, constipation  HPI: Patient is a 81 year old female with hypertension, osteoarthritis, hyperlipidemia, recent UTI, chronic back pain presented to ED with worsening back pain, right-sided abdominal pain, constipation over the last 1 week. History was obtained from the patient and her son at the bedside. Patient lives with her son. Per patient's son, her back pain started around August 12th in the lower back, worse with movement. Patient's son also reported that it all started with significant constipation around the same time. She had no bowel movement for about a week. Patient presented to the ED on 8/16. She was found to have Escherichia coli UTI and was placed on Keflex. For the constipation, patient was given a prescription for MiraLAX and recommended to follow up with PCP. MRI of the thoracic and lumbar spine did show acute T11 compression fracture. Patient was discharged home however she continued to have back pain. Patient's son reported that finally with the MiraLAX, patient did have bowel movements. Patient describes the back pain as 8/10 today, sharp and worse with movement, in the lower back. She also endorsed having right-sided abdominal pain, no nausea or vomiting or any fevers or chills. Patient's son also reported that she had one dose of Zanaflex muscle relaxant prescribed by her PCP, after which she had a severe reaction and was unresponsive.  Review of Systems:  Constitutional: Denies fever, chills, diaphoresis, +poor appetite and fatigue.  HEENT: Denies photophobia, eye pain,  redness, hearing loss, ear pain, congestion, sore throat, rhinorrhea, sneezing, mouth sores, trouble swallowing, neck pain, neck stiffness and tinnitus.   Respiratory: Denies SOB, DOE, cough, chest tightness,  and wheezing.   Cardiovascular: Denies chest pain, palpitations and leg swelling.  Gastrointestinal: Denies nausea, vomiting. Patient's son reported that she did have small amount of rectal bleeding from the hemorrhoids after the bowel movement started which has cleared.  Genitourinary: Denies dysuria, urgency, frequency, hematuria, flank pain and difficulty urinating.  Musculoskeletal: Please see history of present illness Skin: Denies pallor, rash and wound.  Neurological: Denies dizziness, seizures, syncope, weakness, light-headedness, numbness and headaches.  Hematological: Denies adenopathy. Easy bruising, personal or family bleeding history  Psychiatric/Behavioral: Denies suicidal ideation, mood changes, confusion, nervousness, sleep disturbance and agitation  Past Medical History: Past Medical History  Diagnosis Date  . Psoriasis     scalp  . Cancer     basal cells on back, shoulder and nose  . Chicken pox as a child  . Mumps as a child  . Measles as a child  . Hypertension   . Fainting spell     X 1 time, at home in hospital 3 days  . Broken leg 1996  . Arthritis     cervical, spurs  . Neck pain, chronic   . History of shingles   . Seborrhea capitis   . Seborrheic keratosis   . Pericarditis   .  Back pain 05/16/2011  . DOE (dyspnea on exertion) 05/16/2011  . Preventative health care 05/16/2011  . Hyperlipidemia 06/19/2011  . UTI (lower urinary tract infection) 06/23/2011  . Hyponatremia 11/12/2011  . Sacroiliac dysfunction 11/12/2011    Past Surgical History  Procedure Laterality Date  . Paracarditice  1981      . Back disc repair  1979    low back with LLE radiculopathy  . Double knee replacement  1990  . Anaphalactic shock  1990    due to drug Voltaren  .  Breast lumpectomy  1947    left  . Cataract extraction, bilateral    . Tubal ligation    . Biopsy left leg  10-23-11    benign    Medications: Prior to Admission medications   Medication Sig Start Date End Date Taking? Authorizing Provider  acetaminophen (TYLENOL) 500 MG tablet Take 1,000 mg by mouth every 4 (four) hours as needed for mild pain, moderate pain or headache (Max 4 doses in 24 hours).    Yes Historical Provider, MD  amLODipine (NORVASC) 5 MG tablet Take 1 tablet (5 mg total) by mouth daily. 05/16/11  Yes Mosie Lukes, MD  cephALEXin (KEFLEX) 500 MG capsule Take 500 mg by mouth 2 (two) times daily. 5 day course 09/24/14  Yes Historical Provider, MD  doxycycline (VIBRAMYCIN) 50 MG capsule Take 50 mg by mouth daily. As needed for rosecea 09/23/14  Yes Historical Provider, MD  polyethylene glycol powder (MIRALAX) powder TAKE 6 CAPFULS OF MIRALAX IN A 32 OUNCE GATORADE AND DRINK THE WHOLE BEVERAGE FOLLOWED BY 3 CAPFULS TWICE A DAY FOR THE NEXT WEEK AND FOLLOW UP WITH YOUR PRIMARY CARE PHYSICIAN. 09/20/14  Yes Leo Grosser, MD    Allergies:   Allergies  Allergen Reactions  . Diclofenac Sodium Anaphylaxis  . Ibuprofen Anaphylaxis  . Lisinopril     Fall hazard, dizzy  . Adhesive [Tape]   . Codeine Nausea Only    Social History:  reports that she quit smoking about 18 years ago. Her smoking use included Cigarettes. She quit after 41 years of use. She has never used smokeless tobacco. She reports that she does not drink alcohol or use illicit drugs.  Family History: Family History  Problem Relation Age of Onset  . Cancer Mother     ovarian  . Cancer Father     pancreatic  . Heart disease Son   . Diabetes Son     type 2  . COPD Son     smoked  . Emphysema Son     Physical Exam: Blood pressure 140/68, pulse 93, temperature 98.1 F (36.7 C), temperature source Oral, resp. rate 18, SpO2 96 %. General: Alert, awake, oriented x3, in no acute distress. HEENT:  normocephalic, atraumatic, anicteric sclera, pink conjunctiva, pupils equal and reactive to light and accomodation, oropharynx clear Neck: supple, no masses or lymphadenopathy, no goiter, no bruits  Heart: Regular rate and rhythm, without murmurs, rubs or gallops. Lungs: Clear to auscultation bilaterally, no wheezing, rales or rhonchi. Abdomen: Soft, right-sided abdominal tenderness to deep palpation, nondistended, positive bowel sounds, no masses. Extremities: No clubbing, cyanosis or edema with positive pedal pulses. Neuro: Grossly intact, no focal neurological deficits, strength 5/5 upper and lower extremities bilaterally Psych: alert and oriented x 3, normal mood and affect Skin: no rashes or lesions, warm and dry Back: No significant spinal tenderness although patient was in significant pain on sitting up with assistance   LABS on Admission:  Basic  Metabolic Panel:  Recent Labs Lab 09/20/14 2030 09/27/14 1200  NA 140 134*  K 4.0 3.6  CL 106 103  CO2 25 24  GLUCOSE 97 98  BUN 10 7  CREATININE 0.94 0.85  CALCIUM 9.3 9.2   Liver Function Tests:  Recent Labs Lab 09/27/14 1200  AST 23  ALT 18  ALKPHOS 74  BILITOT 0.6  PROT 7.1  ALBUMIN 3.8   No results for input(s): LIPASE, AMYLASE in the last 168 hours. No results for input(s): AMMONIA in the last 168 hours. CBC:  Recent Labs Lab 09/20/14 2030 09/27/14 1200  WBC 5.6 5.0  NEUTROABS 3.4 3.4  HGB 13.7 13.7  HCT 42.1 40.6  MCV 91.3 89.8  PLT 192 262   Cardiac Enzymes: No results for input(s): CKTOTAL, CKMB, CKMBINDEX, TROPONINI in the last 168 hours. BNP: Invalid input(s): POCBNP CBG: No results for input(s): GLUCAP in the last 168 hours.  Radiological Exams on Admission:  Dg Thoracic Spine 2 View  09/27/2014   CLINICAL DATA:  Upper thoracic pain for 2 weeks. Personal history of breast cancer.  EXAM: THORACIC SPINE 2 VIEWS  COMPARISON:  09/20/2014 MR. 06/18/2009 and prior chest radiographs  FINDINGS: A T11  compression fracture appears unchanged from 09/20/2014 MR with approximately 25% height loss.  No other thoracic spine fracture identified.  There is no evidence of subluxation or definite focal bony lesion.  Thoracic aortic atherosclerotic calcifications are noted.  IMPRESSION: Unchanged 25% T11 compression fracture since 09/20/2014.  No new abnormalities identified.   Electronically Signed   By: Margarette Canada M.D.   On: 09/27/2014 13:15   Mr Thoracic Spine Wo Contrast  09/20/2014   CLINICAL DATA:  Initial evaluation for acute back pain. Urinary incontinence.  EXAM: MRI THORACIC AND LUMBAR SPINE WITHOUT CONTRAST  TECHNIQUE: Multiplanar and multiecho pulse sequences of the thoracic and lumbar spine were obtained without intravenous contrast.  COMPARISON:  None.  FINDINGS: MR THORACIC SPINE FINDINGS  Vertebral bodies are normally aligned with preservation of the normal thoracic kyphosis. No listhesis.  There is abnormal T1 hypo intense signal intensity with T2 hyperintense signal intensity extending through the mid and inferior aspect of the T11 vertebral body, consistent with an acute compression type fracture deformity. There is mild concavity at the inferior endplate of G29 with approximately 25-30% of central height loss. No bony retropulsion or epidural hematoma. Mild edema within the adjacent paravertebral soft tissues. Adjacent T11-12 intervertebral disc is normal in appearance without convincing evidence for active infection. No other fracture. Vertebral body heights otherwise maintained. Bone marrow signal intensity otherwise normal.  Signal intensity within the thoracic spinal cord is normal. No cord compression or significant canal stenosis.  Degenerative disc bulging noted at the C6-7 level with resultant mild canal stenosis, incompletely evaluated on this exam. Tiny central disc protrusions present at T5-6, T6-7, and T7-8 without stenosis. Mild disc bulge at the T11-12 level with superimposed facet  arthrosis. There is mild canal narrowing at this level. No significant foraminal stenosis. No other significant degenerative changes within the thoracic spine.  Other than the above-mentioned prevertebral edema at the level of the fracture, the paraspinous soft tissues within normal limits. Probable small sebaceous cyst within the subcutaneous fat of the upper back. T2 hyperintense cyst noted within the right kidney.  MR LUMBAR SPINE FINDINGS  Mild dextroscoliosis present with apex at L2-3. Vertebral bodies otherwise normally aligned with preservation of the normal lumbar lordosis. Previously noted T11 compression type fracture partially visualized. Vertebral body  heights are otherwise maintained. Signal intensity within the vertebral body bone marrow otherwise normal. No other acute fracture or listhesis.  Conus medullaris terminates normally at the L1-2 level. Signal intensity within the visualized cord is normal. No cord compression.  Fatty atrophy noted within the paraspinous musculature. Paraspinous soft tissues otherwise unremarkable. Scattered T2 hyperintense cysts noted within the kidneys bilaterally.  T12-L1:  Negative.  L1-2: Degenerative disc bulge with disc desiccation and mild intervertebral disc space narrowing. Bilateral facet arthrosis and ligamentum flavum hypertrophy. No focal disc herniation. There is resultant mild canal and lateral recess stenosis bilaterally. Mild bilateral foraminal narrowing present as well.  L2-3: Mild disc bulge with disc desiccation. Anterior endplate osteophytic spurring. Mild facet and ligamentous hypertrophy. No significant canal or foraminal stenosis.  L3-4: Disc desiccation with mild diffuse annular disc bulge, slightly eccentric to the left. No focal disc herniation. Mild facet and ligamentous hypertrophy. Resultant mild left lateral recess stenosis without significant canal narrowing. Mild left foraminal narrowing related to disc bulge and facet disease. No  significant right foraminal narrowing.  L4-5: Mild disc bulge with disc desiccation. Superimposed facet and ligamentous hypertrophy. Resultant mild canal narrowing at this level. No significant foraminal stenosis.  L5-S1: Diffuse degenerative disc bulge with disc desiccation. Shallow broad-based posterior disc protrusion with associated annular fissure. Mild facet hypertrophy. No significant canal or foraminal stenosis. The protruding disc closely approximates the transiting S1 nerve roots in the lateral recesses bilaterally without frank neural impingement.  IMPRESSION: MR THORACIC SPINE IMPRESSION  1. No cord compression or severe canal stenosis within the thoracic spine. 2. Acute compression type fracture involving the T11 mid and lower T11 vertebral body with approximately 30% of central height loss at the T11 inferior endplate. No bony retropulsion. 3. Disc bulge with facet arthrosis at T11-12 with resultant mild canal narrowing. 4. Small central disc protrusions at T5-6, T6-7, and T7-8 without stenosis.  MR LUMBAR SPINE IMPRESSION  1. No cord compression or severe canal stenosis within the lumbar spine. 2. Mild multilevel degenerative spondylolysis as detailed above without significant canal or foraminal stenosis.   Electronically Signed   By: Jeannine Boga M.D.   On: 09/20/2014 22:50   Mr Lumbar Spine Wo Contrast  09/20/2014   CLINICAL DATA:  Initial evaluation for acute back pain. Urinary incontinence.  EXAM: MRI THORACIC AND LUMBAR SPINE WITHOUT CONTRAST  TECHNIQUE: Multiplanar and multiecho pulse sequences of the thoracic and lumbar spine were obtained without intravenous contrast.  COMPARISON:  None.  FINDINGS: MR THORACIC SPINE FINDINGS  Vertebral bodies are normally aligned with preservation of the normal thoracic kyphosis. No listhesis.  There is abnormal T1 hypo intense signal intensity with T2 hyperintense signal intensity extending through the mid and inferior aspect of the T11 vertebral  body, consistent with an acute compression type fracture deformity. There is mild concavity at the inferior endplate of F62 with approximately 25-30% of central height loss. No bony retropulsion or epidural hematoma. Mild edema within the adjacent paravertebral soft tissues. Adjacent T11-12 intervertebral disc is normal in appearance without convincing evidence for active infection. No other fracture. Vertebral body heights otherwise maintained. Bone marrow signal intensity otherwise normal.  Signal intensity within the thoracic spinal cord is normal. No cord compression or significant canal stenosis.  Degenerative disc bulging noted at the C6-7 level with resultant mild canal stenosis, incompletely evaluated on this exam. Tiny central disc protrusions present at T5-6, T6-7, and T7-8 without stenosis. Mild disc bulge at the T11-12 level with superimposed facet arthrosis.  There is mild canal narrowing at this level. No significant foraminal stenosis. No other significant degenerative changes within the thoracic spine.  Other than the above-mentioned prevertebral edema at the level of the fracture, the paraspinous soft tissues within normal limits. Probable small sebaceous cyst within the subcutaneous fat of the upper back. T2 hyperintense cyst noted within the right kidney.  MR LUMBAR SPINE FINDINGS  Mild dextroscoliosis present with apex at L2-3. Vertebral bodies otherwise normally aligned with preservation of the normal lumbar lordosis. Previously noted T11 compression type fracture partially visualized. Vertebral body heights are otherwise maintained. Signal intensity within the vertebral body bone marrow otherwise normal. No other acute fracture or listhesis.  Conus medullaris terminates normally at the L1-2 level. Signal intensity within the visualized cord is normal. No cord compression.  Fatty atrophy noted within the paraspinous musculature. Paraspinous soft tissues otherwise unremarkable. Scattered T2  hyperintense cysts noted within the kidneys bilaterally.  T12-L1:  Negative.  L1-2: Degenerative disc bulge with disc desiccation and mild intervertebral disc space narrowing. Bilateral facet arthrosis and ligamentum flavum hypertrophy. No focal disc herniation. There is resultant mild canal and lateral recess stenosis bilaterally. Mild bilateral foraminal narrowing present as well.  L2-3: Mild disc bulge with disc desiccation. Anterior endplate osteophytic spurring. Mild facet and ligamentous hypertrophy. No significant canal or foraminal stenosis.  L3-4: Disc desiccation with mild diffuse annular disc bulge, slightly eccentric to the left. No focal disc herniation. Mild facet and ligamentous hypertrophy. Resultant mild left lateral recess stenosis without significant canal narrowing. Mild left foraminal narrowing related to disc bulge and facet disease. No significant right foraminal narrowing.  L4-5: Mild disc bulge with disc desiccation. Superimposed facet and ligamentous hypertrophy. Resultant mild canal narrowing at this level. No significant foraminal stenosis.  L5-S1: Diffuse degenerative disc bulge with disc desiccation. Shallow broad-based posterior disc protrusion with associated annular fissure. Mild facet hypertrophy. No significant canal or foraminal stenosis. The protruding disc closely approximates the transiting S1 nerve roots in the lateral recesses bilaterally without frank neural impingement.  IMPRESSION: MR THORACIC SPINE IMPRESSION  1. No cord compression or severe canal stenosis within the thoracic spine. 2. Acute compression type fracture involving the T11 mid and lower T11 vertebral body with approximately 30% of central height loss at the T11 inferior endplate. No bony retropulsion. 3. Disc bulge with facet arthrosis at T11-12 with resultant mild canal narrowing. 4. Small central disc protrusions at T5-6, T6-7, and T7-8 without stenosis.  MR LUMBAR SPINE IMPRESSION  1. No cord compression  or severe canal stenosis within the lumbar spine. 2. Mild multilevel degenerative spondylolysis as detailed above without significant canal or foraminal stenosis.   Electronically Signed   By: Jeannine Boga M.D.   On: 09/20/2014 22:50    *I have personally reviewed the images above*  EKG: None available   Assessment/Plan Principal Problem:  Acute lower Back pain:  - MRI of the thoracic and lumbar spine showed acute compression fracture involving the T11 mid and lower vertebral body with 30% central height loss, disc bulge at T11 and 12, no cord compression or severe canal stenosis in the thoracic or lumbar spine. -  placed on pain control with bowel regimen  - PTOT evaluation  - IR consult for possible kyphoplasty   Active Problems: Severe constipation with abdominal pain : Rule out any mechanical obstruction, ileus - Obtain CT abdomen and pelvis, TSH - Placed on scheduled bowel regimen    Hypertension - Currently stable, continue Norvasc   Recent  UTI  - Urine culture on 8/16 showed Escherichia coli, was treated with Keflex. Repeat UA on 8/23 today showed no UTI.    DVT prophylaxis: Heparin subcutaneous  CODE STATUS:  full CODE STATUS   Family Communication: Admission, patients condition and plan of care including tests being ordered have been discussed with the patient and son who indicates understanding and agree with the plan and Code Status  Disposition plan: Further plan will depend as patient's clinical course evolves and further radiologic and laboratory data become available.   Time Spent on Admission: 60 mins  RAI,RIPUDEEP M.D. [facility] 09/27/2014, 4:32 PM Pager: [phone]  If 7PM-7AM, please contact night-coverage [email or web address] Password [email or web address]

## 2014-09-27 NOTE — ED Notes (Signed)
Bib ems c/o 2 week hx low back pain radiating to left ribs. Increase in severity midnight last pm. Denies injury. Current treatment for uti.

## 2014-09-27 NOTE — ED Notes (Signed)
Bed: HE52 Expected date:  Expected time:  Means of arrival:  Comments: EMS-UTI

## 2014-09-27 NOTE — Progress Notes (Signed)
Discussed admission status with Dr. Tana Coast.

## 2014-09-27 NOTE — ED Provider Notes (Signed)
Medical screening examination/treatment/procedure(s) were conducted as a shared visit with non-physician practitioner(s) and myself.  I personally evaluated the patient during the encounter.   EKG Interpretation None     Patient here with worsening thoracic back pain from her fall. No extension of her thoracic fracture. We'll consult hospice for admission for pain management  Lacretia Leigh, MD 09/27/14 [phone]

## 2014-09-27 NOTE — ED Provider Notes (Signed)
CSN: [id number]     Arrival date & time 09/27/14  1044 History   First MD Initiated Contact with Patient 09/27/14 1048     Chief Complaint  Patient presents with  . Back Pain     (Consider location/radiation/quality/duration/timing/severity/associated sxs/prior Treatment) HPI Patient presents to the emergency department with continued back pain.  It has been ongoing for the last few weeks.  The patient states she was seen here with back pain and she is also having constipation at that time.  Patient had an MRI of her lumbar and thoracic spine.  She states she does not have any trauma that she is aware of the patient states that nothing seems to make her condition better, movement and palpation make the pain worse.  The patient denies chest pain, shortness of breath, nausea, vomiting, weakness, dizziness, headache, blurred vision, incontinence, dysuria, rash, fever, cough, runny nose, sore throat, bloody stool or syncope.  The patient states that she did not take any medications prior to arrival Past Medical History  Diagnosis Date  . Psoriasis     scalp  . Cancer     basal cells on back, shoulder and nose  . Chicken pox as a child  . Mumps as a child  . Measles as a child  . Hypertension   . Fainting spell     X 1 time, at home in hospital 3 days  . Broken leg 1996  . Arthritis     cervical, spurs  . Neck pain, chronic   . History of shingles   . Seborrhea capitis   . Seborrheic keratosis   . Pericarditis   . Back pain 05/16/2011  . DOE (dyspnea on exertion) 05/16/2011  . Preventative health care 05/16/2011  . Hyperlipidemia 06/19/2011  . UTI (lower urinary tract infection) 06/23/2011  . Hyponatremia 11/12/2011  . Sacroiliac dysfunction 11/12/2011   Past Surgical History  Procedure Laterality Date  . Paracarditice  1981    [place]  . Back disc repair  1979    low back with LLE radiculopathy  . Double knee replacement  1990  . Anaphalactic shock  1990    due to drug Voltaren  .  Breast lumpectomy  1947    left  . Cataract extraction, bilateral    . Tubal ligation    . Biopsy left leg  10-23-11    benign   Family History  Problem Relation Age of Onset  . Cancer Mother     ovarian  . Cancer Father     pancreatic  . Heart disease Son   . Diabetes Son     type 2  . COPD Son     smoked  . Emphysema Son    Social History  Substance Use Topics  . Smoking status: Former Smoker -- 41 years    Types: Cigarettes    Quit date: 02/05/1996  . Smokeless tobacco: Never Used  . Alcohol Use: No   OB History    No data available     Review of Systems  All other systems negative except as documented in the HPI. All pertinent positives and negatives as reviewed in the HPI.  Allergies  Diclofenac sodium; Ibuprofen; Lisinopril; Adhesive; and Codeine  Home Medications   Prior to Admission medications   Medication Sig Start Date End Date Taking? Authorizing Provider  acetaminophen (TYLENOL) 500 MG tablet Take 1,000 mg by mouth every 4 (four) hours as needed for mild pain, moderate pain or headache (Max 4 doses  in 24 hours).    Yes Historical Provider, MD  amLODipine (NORVASC) 5 MG tablet Take 1 tablet (5 mg total) by mouth daily. 05/16/11  Yes Mosie Lukes, MD  cephALEXin (KEFLEX) 500 MG capsule Take 500 mg by mouth 2 (two) times daily. 5 day course 09/24/14  Yes Historical Provider, MD  doxycycline (VIBRAMYCIN) 50 MG capsule Take 50 mg by mouth daily. As needed for rosecea 09/23/14  Yes Historical Provider, MD  polyethylene glycol powder (MIRALAX) powder TAKE 6 CAPFULS OF MIRALAX IN A 32 OUNCE GATORADE AND DRINK THE WHOLE BEVERAGE FOLLOWED BY 3 CAPFULS TWICE A DAY FOR THE NEXT WEEK AND FOLLOW UP WITH YOUR PRIMARY CARE PHYSICIAN. 09/20/14  Yes Leo Grosser, MD   BP 140/68 mmHg  Pulse 93  Temp(Src) 98.1 F (36.7 C) (Oral)  Resp 18  SpO2 96% Physical Exam  Constitutional: She is oriented to person, place, and time. She appears well-developed and well-nourished. No  distress.  HENT:  Head: Normocephalic and atraumatic.  Mouth/Throat: Oropharynx is clear and moist.  Eyes: Pupils are equal, round, and reactive to light.  Neck: Normal range of motion. Neck supple.  Cardiovascular: Normal rate, regular rhythm and normal heart sounds.  Exam reveals no gallop and no friction rub.   No murmur heard. Pulmonary/Chest: Effort normal and breath sounds normal. No respiratory distress.  Neurological: She is alert and oriented to person, place, and time. She displays normal reflexes. She exhibits normal muscle tone. Coordination normal.  Skin: Skin is warm and dry. No rash noted. No erythema.  Nursing note and vitals reviewed.   ED Course  Procedures (including critical care time) Labs Review Labs Reviewed  COMPREHENSIVE METABOLIC PANEL - Abnormal; Notable for the following:    Sodium 134 (*)    GFR calc non Af Amer 60 (*)    All other components within normal limits  URINALYSIS, ROUTINE W REFLEX MICROSCOPIC (NOT AT [facility]) - Abnormal; Notable for the following:    Specific Gravity, Urine 1.002 (*)    All other components within normal limits  CBC WITH DIFFERENTIAL/PLATELET  I-STAT BETA HCG BLOOD, ED ([facility], [facility], [facility] ONLY)  Randolm Idol, ED    Imaging Review Dg Thoracic Spine 2 View  09/27/2014   CLINICAL DATA:  Upper thoracic pain for 2 weeks. Personal history of breast cancer.  EXAM: THORACIC SPINE 2 VIEWS  COMPARISON:  09/20/2014 MR. 06/18/2009 and prior chest radiographs  FINDINGS: A T11 compression fracture appears unchanged from 09/20/2014 MR with approximately 25% height loss.  No other thoracic spine fracture identified.  There is no evidence of subluxation or definite focal bony lesion.  Thoracic aortic atherosclerotic calcifications are noted.  IMPRESSION: Unchanged 25% T11 compression fracture since 09/20/2014.  No new abnormalities identified.   Electronically Signed   By: Margarette Canada M.D.   On: 09/27/2014 13:15   I have personally reviewed and  evaluated these images and lab results as part of my medical decision-making.  Patient be admitted to the hospital for further evaluation and care of this compression fracture.  She will most likely need placement in a rehabilitation facility.   Dalia Heading, PA-C 09/29/14 1517  Lacretia Leigh, MD 09/29/14 [phone]

## 2014-09-27 NOTE — Progress Notes (Signed)
EDCM spoke to patient and family at bedside.  Patient lives at home with her son Saralyn Pilar.  Patrick's phone number [phone] or if in extreme emergency [phone].  Patient does not have any home health services at this time and has never had home health services.  Patient's pcp is Dr. Lorenza Evangelist Snyder [phone].  Saralyn Pilar reports patient has a large support system, and t times like this will take shifts on taking care of the patient.  Patient has a walker, cane, shower chair and bedside commode at home.  Saralyn Pilar expresses interest in a wheelchair or a transport chair.  Patient requires assistance with her ADL's.  Patient and patient's son adamantly refused  patient to got to a nursing facility.  They would prefer patient to go home on discharge with home health .  EDCM provided patient's son with list of home health agencies in [facility] and explained services.  Patient's son has chosen [facility] for home health services on discharge. EDCM explained home health agency of their choice has 24-48 hours to contact them for services. EDCM assessed for further needs at this time and offered support to patient's son.  No further needs at this time.  Patient's son thankful for services.  No further EDCM needs at this time.

## 2014-09-28 ENCOUNTER — Observation Stay (HOSPITAL_COMMUNITY): Payer: Medicare Other

## 2014-09-28 DIAGNOSIS — E785 Hyperlipidemia, unspecified: Secondary | ICD-10-CM | POA: Diagnosis not present

## 2014-09-28 DIAGNOSIS — R1084 Generalized abdominal pain: Secondary | ICD-10-CM | POA: Diagnosis not present

## 2014-09-28 DIAGNOSIS — K5901 Slow transit constipation: Secondary | ICD-10-CM

## 2014-09-28 DIAGNOSIS — R1031 Right lower quadrant pain: Secondary | ICD-10-CM

## 2014-09-28 DIAGNOSIS — K5900 Constipation, unspecified: Secondary | ICD-10-CM | POA: Diagnosis not present

## 2014-09-28 DIAGNOSIS — R935 Abnormal findings on diagnostic imaging of other abdominal regions, including retroperitoneum: Secondary | ICD-10-CM

## 2014-09-28 DIAGNOSIS — M4854XA Collapsed vertebra, not elsewhere classified, thoracic region, initial encounter for fracture: Principal | ICD-10-CM

## 2014-09-28 DIAGNOSIS — H9202 Otalgia, left ear: Secondary | ICD-10-CM

## 2014-09-28 DIAGNOSIS — E440 Moderate protein-calorie malnutrition: Secondary | ICD-10-CM | POA: Insufficient documentation

## 2014-09-28 DIAGNOSIS — I10 Essential (primary) hypertension: Secondary | ICD-10-CM | POA: Diagnosis not present

## 2014-09-28 LAB — PROTIME-INR
INR: 1.03 (ref 0.00–1.49)
PROTHROMBIN TIME: 13.7 s (ref 11.6–15.2)

## 2014-09-28 LAB — TSH: TSH: 0.961 u[IU]/mL (ref 0.350–4.500)

## 2014-09-28 LAB — BASIC METABOLIC PANEL
Anion gap: 8 (ref 5–15)
BUN: 8 mg/dL (ref 6–20)
CHLORIDE: 105 mmol/L (ref 101–111)
CO2: 24 mmol/L (ref 22–32)
CREATININE: 0.86 mg/dL (ref 0.44–1.00)
Calcium: 8.8 mg/dL — ABNORMAL LOW (ref 8.9–10.3)
GFR calc Af Amer: >60 mL/min (ref >60)
GFR calc non Af Amer: 59 mL/min — ABNORMAL LOW (ref >60)
Glucose, Bld: 104 mg/dL — ABNORMAL HIGH (ref 65–99)
POTASSIUM: 4 mmol/L (ref 3.5–5.1)
Sodium: 137 mmol/L (ref 135–145)

## 2014-09-28 LAB — CBC
HEMATOCRIT: 38.8 % (ref 36.0–46.0)
Hemoglobin: 12.8 g/dL (ref 12.0–15.0)
MCH: 30.3 pg (ref 26.0–34.0)
MCHC: 33 g/dL (ref 30.0–36.0)
MCV: 91.7 fL (ref 78.0–100.0)
PLATELETS: 238 10*3/uL (ref 150–400)
RBC: 4.23 MIL/uL (ref 3.87–5.11)
RDW: 13.8 % (ref 11.5–15.5)
WBC: 4.3 10*3/uL (ref 4.0–10.5)

## 2014-09-28 LAB — APTT: APTT: 32 s (ref 24–37)

## 2014-09-28 MED ORDER — POLYETHYLENE GLYCOL 3350 17 G PO PACK
17.0000 g | PACK | Freq: Every day | ORAL | Status: DC
  Administered 2014-09-28 – 2014-09-30 (×3): 17 g via ORAL
  Filled 2014-09-28 (×3): qty 1

## 2014-09-28 MED ORDER — HYDROCODONE-ACETAMINOPHEN 5-325 MG PO TABS: 1.0000 | ORAL_TABLET | ORAL | Status: DC | PRN

## 2014-09-28 MED ORDER — BOOST / RESOURCE BREEZE PO LIQD
1.0000 | Freq: Three times a day (TID) | ORAL | Status: DC
  Administered 2014-09-28 – 2014-10-01 (×6): 1 via ORAL

## 2014-09-28 MED ORDER — CIPROFLOXACIN-DEXAMETHASONE 0.3-0.1 % OT SUSP
2.0000 [drp] | Freq: Two times a day (BID) | OTIC | Status: DC
Start: 2014-09-28 — End: 2014-10-01
  Administered 2014-09-28 – 2014-10-01 (×7): 2 [drp] via OTIC
  Filled 2014-09-28: qty 7.5

## 2014-09-28 MED ORDER — MAGNESIUM CITRATE PO SOLN
1.0000 | Freq: Once | ORAL | Status: AC
  Administered 2014-09-29: 1 via ORAL
  Filled 2014-09-28: qty 296

## 2014-09-28 MED ORDER — PANTOPRAZOLE SODIUM 40 MG PO TBEC
40.0000 mg | DELAYED_RELEASE_TABLET | Freq: Every day | ORAL | Status: DC
  Administered 2014-09-28 – 2014-10-01 (×4): 40 mg via ORAL
  Filled 2014-09-28 (×5): qty 1

## 2014-09-28 NOTE — Progress Notes (Signed)
    IR aware of request for evaluation/consult for T11 kyphoplasty/vertebroplasty  MRI is in review Labs ordered Ins to be pre certified  We will see pt asap

## 2014-09-28 NOTE — Progress Notes (Signed)
Utilization review completed.  

## 2014-09-28 NOTE — Consult Note (Signed)
Chief Complaint: Patient was seen in consultation today for evaluation of potential [facility] treatment options for symptomatic T11 compression fracture at the request of Dr. Tana Coast   Referring Physician(s): Ripudeep Rai  History of Present Illness:  [name] is a 81 y.o. female with past medical history significant for hypertension and hyperlipidemia who approximately 2 weeks ago experienced the insidious onset of lower thoracic/upper lumbar pain. The patient denies any inciting trauma or fall though admits that she has somewhat limited short-term memory.   The patient states that prior to 2 weeks ago she has not experienced back pain and has been quite active. 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   The patient lives with her son though assists with many activities of daily living. Prior to the onset of her back pain, the patient ambulates without an assistive device.  The patient was initially presented to the ED with back pain on 09/20/2014 where thoracic MRI was obtained demonstrating an acute mild compression fracture involving the T11 vertebral body. The patient was subsequently discharged home, however returned to the hospital on 09/27/2014 secondary to progressive and worsening back pain.   Also of note, the patient experienced a small superficial burn involving the mid/lower back as a result of falling asleep on a heating pad.  The patient currently states that her back pain is approximately 2-3 at rest or worsens to at least 8 out of 10 with any activity or movement.  Patient states that since the onset of her back pain, her chronic constipation symptoms have worsened which she attributes at least in part due to her worsening back pain and decreased activity. No lower extremity weakness or paresthesias. No change in bladder function. No fever or chills.   Past Medical History  Diagnosis Date  . Psoriasis     scalp  .  Cancer     basal cells on back, shoulder and nose  . Chicken pox as a child  . Mumps as a child  . Measles as a child  . Hypertension   . Fainting spell     X 1 time, at home in hospital 3 days  . Broken leg 1996  . Arthritis     cervical, spurs  . Neck pain, chronic   . History of shingles   . Seborrhea capitis   . Seborrheic keratosis   . Pericarditis   . Back pain 05/16/2011  . DOE (dyspnea on exertion) 05/16/2011  . Preventative health care 05/16/2011  . Hyperlipidemia 06/19/2011  . UTI (lower urinary tract infection) 06/23/2011  . Hyponatremia 11/12/2011  . Sacroiliac dysfunction 11/12/2011    Past Surgical History  Procedure Laterality Date  . Paracarditice  1981    [place]  . Back disc repair  1979    low back with LLE radiculopathy  . Double knee replacement  1990  . Anaphalactic shock  1990    due to drug Voltaren  . Breast lumpectomy  1947    left  . Cataract extraction, bilateral    . Tubal ligation    . Biopsy left leg  10-23-11    benign    Allergies: Diclofenac sodium; Ibuprofen; Lisinopril; Adhesive; and Codeine  Medications: Prior to Admission medications   Medication Sig Start Date End Date Taking? Authorizing Provider  acetaminophen (TYLENOL) 500 MG tablet Take 1,000 mg by mouth every 4 (four) hours as needed for mild pain, moderate pain  or headache (Max 4 doses in 24 hours).    Yes Historical Provider, MD  amLODipine (NORVASC) 5 MG tablet Take 1 tablet (5 mg total) by mouth daily. 05/16/11  Yes Mosie Lukes, MD  cephALEXin (KEFLEX) 500 MG capsule Take 500 mg by mouth 2 (two) times daily. 5 day course 09/24/14  Yes Historical Provider, MD  doxycycline (VIBRAMYCIN) 50 MG capsule Take 50 mg by mouth daily. As needed for rosecea 09/23/14  Yes Historical Provider, MD  polyethylene glycol powder (MIRALAX) powder TAKE 6 CAPFULS OF MIRALAX IN A 32 OUNCE GATORADE AND DRINK THE WHOLE BEVERAGE FOLLOWED BY 3 CAPFULS TWICE A DAY FOR THE NEXT WEEK AND FOLLOW UP WITH YOUR  PRIMARY CARE PHYSICIAN. 09/20/14  Yes Leo Grosser, MD     Family History  Problem Relation Age of Onset  . Cancer Mother     ovarian  . Cancer Father     pancreatic  . Heart disease Son   . Diabetes Son     type 2  . COPD Son     smoked  . Emphysema Son     Social History   Social History  . Marital Status: Widowed    Spouse Name: N/A  . Number of Children: N/A  . Years of Education: N/A   Social History Main Topics  . Smoking status: Former Smoker -- 41 years    Types: Cigarettes    Quit date: 02/05/1996  . Smokeless tobacco: Never Used  . Alcohol Use: No  . Drug Use: No  . Sexual Activity: No   Other Topics Concern  . None   Social History Narrative    ECOG Status: 2 - Symptomatic, <50% confined to bed  Review of Systems: A 12 point ROS discussed and pertinent positives are indicated in the HPI above.  All other systems are negative.  Review of Systems  Constitutional: Positive for activity change. Negative for fever.  Respiratory: Negative.   Cardiovascular: Negative.   Gastrointestinal: Positive for abdominal pain and constipation.  Musculoskeletal: Positive for back pain.  Psychiatric/Behavioral: Negative.     Vital Signs: BP 137/56 mmHg  Pulse 82  Temp(Src) 98 F (36.7 C) (Oral)  Resp 18  Ht 5\' 6"  (1.676 m)  Wt 208 lb (94.348 kg)  BMI 33.59 kg/m2  SpO2 98%  Physical Exam  Constitutional: She is oriented to person, place, and time. She appears well-developed and well-nourished.  Abdominal: She exhibits distension. There is tenderness.  Musculoskeletal: She exhibits tenderness.  Pt reports focal pain involving the lower thoracic / upper lumbar spine, correlation with area of the acute T11 vertebral body compression fracture.  Neurological: She is alert and oriented to person, place, and time.  Skin:  The patient has a small superficial burn on the midline of her back as a result of falling asleep on a heating had.    Mallampati Score:      Imaging: Dg Thoracic Spine 2 View  09/27/2014   CLINICAL DATA:  Upper thoracic pain for 2 weeks. Personal history of breast cancer.  EXAM: THORACIC SPINE 2 VIEWS  COMPARISON:  09/20/2014 MR. 06/18/2009 and prior chest radiographs  FINDINGS: A T11 compression fracture appears unchanged from 09/20/2014 MR with approximately 25% height loss.  No other thoracic spine fracture identified.  There is no evidence of subluxation or definite focal bony lesion.  Thoracic aortic atherosclerotic calcifications are noted.  IMPRESSION: Unchanged 25% T11 compression fracture since 09/20/2014.  No new abnormalities identified.   Electronically Signed  By: Margarette Canada M.D.   On: 09/27/2014 13:15   Mr Thoracic Spine Wo Contrast  09/20/2014   CLINICAL DATA:  Initial evaluation for acute back pain. Urinary incontinence.  EXAM: MRI THORACIC AND LUMBAR SPINE WITHOUT CONTRAST  TECHNIQUE: Multiplanar and multiecho pulse sequences of the thoracic and lumbar spine were obtained without intravenous contrast.  COMPARISON:  None.  FINDINGS: MR THORACIC SPINE FINDINGS  Vertebral bodies are normally aligned with preservation of the normal thoracic kyphosis. No listhesis.  There is abnormal T1 hypo intense signal intensity with T2 hyperintense signal intensity extending through the mid and inferior aspect of the T11 vertebral body, consistent with an acute compression type fracture deformity. There is mild concavity at the inferior endplate of O13 with approximately 25-30% of central height loss. No bony retropulsion or epidural hematoma. Mild edema within the adjacent paravertebral soft tissues. Adjacent T11-12 intervertebral disc is normal in appearance without convincing evidence for active infection. No other fracture. Vertebral body heights otherwise maintained. Bone marrow signal intensity otherwise normal.  Signal intensity within the thoracic spinal cord is normal. No cord compression or significant canal stenosis.  Degenerative  disc bulging noted at the C6-7 level with resultant mild canal stenosis, incompletely evaluated on this exam. Tiny central disc protrusions present at T5-6, T6-7, and T7-8 without stenosis. Mild disc bulge at the T11-12 level with superimposed facet arthrosis. There is mild canal narrowing at this level. No significant foraminal stenosis. No other significant degenerative changes within the thoracic spine.  Other than the above-mentioned prevertebral edema at the level of the fracture, the paraspinous soft tissues within normal limits. Probable small sebaceous cyst within the subcutaneous fat of the upper back. T2 hyperintense cyst noted within the right kidney.  MR LUMBAR SPINE FINDINGS  Mild dextroscoliosis present with apex at L2-3. Vertebral bodies otherwise normally aligned with preservation of the normal lumbar lordosis. Previously noted T11 compression type fracture partially visualized. Vertebral body heights are otherwise maintained. Signal intensity within the vertebral body bone marrow otherwise normal. No other acute fracture or listhesis.  Conus medullaris terminates normally at the L1-2 level. Signal intensity within the visualized cord is normal. No cord compression.  Fatty atrophy noted within the paraspinous musculature. Paraspinous soft tissues otherwise unremarkable. Scattered T2 hyperintense cysts noted within the kidneys bilaterally.  T12-L1:  Negative.  L1-2: Degenerative disc bulge with disc desiccation and mild intervertebral disc space narrowing. Bilateral facet arthrosis and ligamentum flavum hypertrophy. No focal disc herniation. There is resultant mild canal and lateral recess stenosis bilaterally. Mild bilateral foraminal narrowing present as well.  L2-3: Mild disc bulge with disc desiccation. Anterior endplate osteophytic spurring. Mild facet and ligamentous hypertrophy. No significant canal or foraminal stenosis.  L3-4: Disc desiccation with mild diffuse annular disc bulge, slightly  eccentric to the left. No focal disc herniation. Mild facet and ligamentous hypertrophy. Resultant mild left lateral recess stenosis without significant canal narrowing. Mild left foraminal narrowing related to disc bulge and facet disease. No significant right foraminal narrowing.  L4-5: Mild disc bulge with disc desiccation. Superimposed facet and ligamentous hypertrophy. Resultant mild canal narrowing at this level. No significant foraminal stenosis.  L5-S1: Diffuse degenerative disc bulge with disc desiccation. Shallow broad-based posterior disc protrusion with associated annular fissure. Mild facet hypertrophy. No significant canal or foraminal stenosis. The protruding disc closely approximates the transiting S1 nerve roots in the lateral recesses bilaterally without frank neural impingement.  IMPRESSION: MR THORACIC SPINE IMPRESSION  1. No cord compression or severe canal stenosis  within the thoracic spine. 2. Acute compression type fracture involving the T11 mid and lower T11 vertebral body with approximately 30% of central height loss at the T11 inferior endplate. No bony retropulsion. 3. Disc bulge with facet arthrosis at T11-12 with resultant mild canal narrowing. 4. Small central disc protrusions at T5-6, T6-7, and T7-8 without stenosis.  MR LUMBAR SPINE IMPRESSION  1. No cord compression or severe canal stenosis within the lumbar spine. 2. Mild multilevel degenerative spondylolysis as detailed above without significant canal or foraminal stenosis.   Electronically Signed   By: Jeannine Boga M.D.   On: 09/20/2014 22:50   Mr Lumbar Spine Wo Contrast  09/20/2014   CLINICAL DATA:  Initial evaluation for acute back pain. Urinary incontinence.  EXAM: MRI THORACIC AND LUMBAR SPINE WITHOUT CONTRAST  TECHNIQUE: Multiplanar and multiecho pulse sequences of the thoracic and lumbar spine were obtained without intravenous contrast.  COMPARISON:  None.  FINDINGS: MR THORACIC SPINE FINDINGS  Vertebral bodies  are normally aligned with preservation of the normal thoracic kyphosis. No listhesis.  There is abnormal T1 hypo intense signal intensity with T2 hyperintense signal intensity extending through the mid and inferior aspect of the T11 vertebral body, consistent with an acute compression type fracture deformity. There is mild concavity at the inferior endplate of T24 with approximately 25-30% of central height loss. No bony retropulsion or epidural hematoma. Mild edema within the adjacent paravertebral soft tissues. Adjacent T11-12 intervertebral disc is normal in appearance without convincing evidence for active infection. No other fracture. Vertebral body heights otherwise maintained. Bone marrow signal intensity otherwise normal.  Signal intensity within the thoracic spinal cord is normal. No cord compression or significant canal stenosis.  Degenerative disc bulging noted at the C6-7 level with resultant mild canal stenosis, incompletely evaluated on this exam. Tiny central disc protrusions present at T5-6, T6-7, and T7-8 without stenosis. Mild disc bulge at the T11-12 level with superimposed facet arthrosis. There is mild canal narrowing at this level. No significant foraminal stenosis. No other significant degenerative changes within the thoracic spine.  Other than the above-mentioned prevertebral edema at the level of the fracture, the paraspinous soft tissues within normal limits. Probable small sebaceous cyst within the subcutaneous fat of the upper back. T2 hyperintense cyst noted within the right kidney.  MR LUMBAR SPINE FINDINGS  Mild dextroscoliosis present with apex at L2-3. Vertebral bodies otherwise normally aligned with preservation of the normal lumbar lordosis. Previously noted T11 compression type fracture partially visualized. Vertebral body heights are otherwise maintained. Signal intensity within the vertebral body bone marrow otherwise normal. No other acute fracture or listhesis.  Conus  medullaris terminates normally at the L1-2 level. Signal intensity within the visualized cord is normal. No cord compression.  Fatty atrophy noted within the paraspinous musculature. Paraspinous soft tissues otherwise unremarkable. Scattered T2 hyperintense cysts noted within the kidneys bilaterally.  T12-L1:  Negative.  L1-2: Degenerative disc bulge with disc desiccation and mild intervertebral disc space narrowing. Bilateral facet arthrosis and ligamentum flavum hypertrophy. No focal disc herniation. There is resultant mild canal and lateral recess stenosis bilaterally. Mild bilateral foraminal narrowing present as well.  L2-3: Mild disc bulge with disc desiccation. Anterior endplate osteophytic spurring. Mild facet and ligamentous hypertrophy. No significant canal or foraminal stenosis.  L3-4: Disc desiccation with mild diffuse annular disc bulge, slightly eccentric to the left. No focal disc herniation. Mild facet and ligamentous hypertrophy. Resultant mild left lateral recess stenosis without significant canal narrowing. Mild left foraminal narrowing related  to disc bulge and facet disease. No significant right foraminal narrowing.  L4-5: Mild disc bulge with disc desiccation. Superimposed facet and ligamentous hypertrophy. Resultant mild canal narrowing at this level. No significant foraminal stenosis.  L5-S1: Diffuse degenerative disc bulge with disc desiccation. Shallow broad-based posterior disc protrusion with associated annular fissure. Mild facet hypertrophy. No significant canal or foraminal stenosis. The protruding disc closely approximates the transiting S1 nerve roots in the lateral recesses bilaterally without frank neural impingement.  IMPRESSION: MR THORACIC SPINE IMPRESSION  1. No cord compression or severe canal stenosis within the thoracic spine. 2. Acute compression type fracture involving the T11 mid and lower T11 vertebral body with approximately 30% of central height loss at the T11  inferior endplate. No bony retropulsion. 3. Disc bulge with facet arthrosis at T11-12 with resultant mild canal narrowing. 4. Small central disc protrusions at T5-6, T6-7, and T7-8 without stenosis.  MR LUMBAR SPINE IMPRESSION  1. No cord compression or severe canal stenosis within the lumbar spine. 2. Mild multilevel degenerative spondylolysis as detailed above without significant canal or foraminal stenosis.   Electronically Signed   By: Jeannine Boga M.D.   On: 09/20/2014 22:50   Ct Abdomen Pelvis W Contrast  09/27/2014   CLINICAL DATA:  Worsening back pain and RIGHT-sided abdominal pain, constipation over 1 week, having constipation, history hypertension, hyperlipidemia, recent UTI  EXAM: CT ABDOMEN AND PELVIS WITH CONTRAST  TECHNIQUE: Multidetector CT imaging of the abdomen and pelvis was performed using the standard protocol following bolus administration of intravenous contrast. Sagittal and coronal MPR images reconstructed from axial data set.  CONTRAST:  123mL OMNIPAQUE IOHEXOL 300 MG/ML SOLN IV. Dilute oral contrast.  COMPARISON:  None ; correlation CT chest 06/18/2009  FINDINGS: 8 mm nodule base of lingula image 1 unchanged since 2011.  Probable minimal atelectasis at posterior medial LEFT lower lobe image 1.  Dependent gallstones in gallbladder.  Tiny BILATERAL renal cysts.  Liver, spleen, pancreas, kidneys, and adrenal glands otherwise normal appearance.  Scattered atherosclerotic calcifications.  Appendix not visualized.  Distended cecum 10.2 cm diameter.  Few uncomplicated sigmoid diverticula.  Stomach and bowel loops otherwise normal appearance.  Atrophic uterus and ovaries.  Small amount of free intraperitoneal fluid in pelvis and at base of RIGHT pericolic gutter.  No mass, adenopathy, free air or hernia.  Diffuse osseous demineralization with orthopedic hardware proximal LEFT femur.  IMPRESSION: Cholelithiasis.  Gaseous distention of cecum up to 10.2 cm transverse without definite wall  thickening or evidence of obstruction.  Tiny BILATERAL renal cysts.  Small amount of nonspecific free intraperitoneal fluid.   Electronically Signed   By: Lavonia Dana M.D.   On: 09/27/2014 18:20   Dg Abd 2 Views  09/28/2014   CLINICAL DATA:  Generalized abdominal pain secondary to constipation.  EXAM: ABDOMEN - 2 VIEW  COMPARISON:  CT scan of September 27, 2014.  FINDINGS: No small bowel dilatation is noted. Air-filled colon is noted. Cecum measures 7.2 cm in maximum measured diameter. There is no evidence of free air. Residual contrast and stool is noted in the right colon. No radio-opaque calculi or other significant radiographic abnormality is seen.  IMPRESSION: Moderate residual stool seen in right colon suggesting constipation. Mild cecal dilatation is noted.   Electronically Signed   By: Marijo Conception, M.D.   On: 09/28/2014 11:48    Labs:  CBC:  Recent Labs  09/20/14 2030 09/27/14 1200 09/28/14 0355  WBC 5.6 5.0 4.3  HGB 13.7 13.7 12.8  HCT 42.1 40.6 38.8  PLT 192 262 238    COAGS:  Recent Labs  09/28/14 1302  INR 1.03  APTT 32    BMP:  Recent Labs  09/20/14 2030 09/27/14 1200 09/28/14 0355  NA 140 134* 137  K 4.0 3.6 4.0  CL 106 103 105  CO2 25 24 24   GLUCOSE 97 98 104*  BUN 10 7 8   CALCIUM 9.3 9.2 8.8*  CREATININE 0.94 0.85 0.86  GFRNONAA 53* 60* 59*  GFRAA >60 >60 >60    LIVER FUNCTION TESTS:  Recent Labs  09/27/14 1200  BILITOT 0.6  AST 23  ALT 18  ALKPHOS 74  PROT 7.1  ALBUMIN 3.8    TUMOR MARKERS: No results for input(s): AFPTM, CEA, CA199, CHROMGRNA in the last 8760 hours.  Assessment and Plan:  TEMILOLUWA RECCHIA is a 81 y.o. female with past mental history significant for hypertension and hyperlipidemia who approximately 2 weeks ago experienced the insidious onset of lower thoracic/upper lumbar pain. The patient denies any inciting trauma or fall though admits that she has somewhat limited short-term memory.   The patient states that prior  to 2 weeks ago she has not experienced back pain and has been quite active. 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   The patient lives with her son though assists with many activities of daily living. Prior to the onset of her back pain, the patient ambulates without an assistive device. The patient was subsequently discharged home, however returned to the hospital on 09/27/2014 secondary to progressive and worsening back pain.  The patient currently states that her back pain is approximately 2-3 at rest or worsens to at least 8 out of 10 with any activity or movement.  Review of thoracic spine MRI obtained 09/20/2014 demonstrates an acute mild acute compression primarily involving the inferior aspect of the T11 body without significant retropulsion, which correlates with the patient's focal area of back pain. As such, I feel this patient would be an excellent candidate for fluoroscopic guided vertebral body cement augmentation.  Of note, the patient did experience a small superficial burn as a result of fall asleep on a heating pad though this hopefully can be avoided at the time of attempted vertebral body augmentation.  Insurance approval will be submitted and after acquisition, the procedure will be performed (hopefully) by the end of the week. As both of my interventional radiology partners who will be at this facility during the remainder of the week (Drs. [place] and [facility]) do not perform this procedure, once approval is obtained, this patient will likely have to be transferred to [facility] to have the procedure performed by Dr. Herminio Heads Milagros Loll MD).  Thank you for this interesting consult.  I greatly enjoyed meeting [name] and look forward to participating in her care.  A copy of this report was sent to the requesting provider on this date.  SignedSandi Mariscal 09/28/2014, 6:18 PM   I spent a total of 20 Minutes in face to face in  clinical consultation, greater than 50% of which was counseling/coordinating care for symptomatic T11 vertebral body compression fracture

## 2014-09-28 NOTE — Progress Notes (Signed)
[facility]                                                                              Patient Demographics  [name], is a 81 y.o. female, DOB - 1924/05/24, QBH:[id number]  Admit date - 09/27/2014   Admitting Physician Brandi Krystal Eaton, MD  Outpatient Primary MD for the patient is Brandi B, PA-C  LOS - 1   Chief Complaint  Patient presents with  . Back Pain       Brief HPI   Patient is a 81 year old female with hypertension, osteoarthritis, hyperlipidemia, recent UTI, chronic back pain presented to ED with worsening back pain, right-sided abdominal pain, constipation over the last 1 week. History was obtained from the patient and her son at the bedside. Patient lives with her son. Per patient's son, her back pain started around August 12th in the lower back, worse with movement. Patient's son also reported that it all started with significant constipation around the same time. She had no bowel movement for about a week. Patient presented to the ED on 8/16. She was found to have Escherichia coli UTI and was placed on Keflex. For the constipation, patient was given a prescription for MiraLAX and recommended to follow up with PCP. MRI of the thoracic and lumbar spine did show acute T11 compression fracture. Patient was discharged home however she continued to have back pain. Patient's son reported that finally with the MiraLAX, patient did have bowel movements. Patient describes the back pain as 8/10 today, sharp and worse with movement, in the lower back. She also endorsed having right-sided abdominal pain, no nausea or vomiting or any fevers or chills. Patient's son also reported that she had one dose of Zanaflex muscle relaxant prescribed by her PCP, after which she had a severe reaction and was unresponsive.    Assessment & Plan   Principal Problem: Acute lower Back pain:  - MRI of the thoracic and lumbar spine showed acute compression fracture involving the  T11 mid and lower vertebral body with 30% central height loss, disc bulge at T11 and 12, no cord compression or severe canal stenosis in the thoracic or lumbar spine. - placed on pain control with bowel regimen  - PTOT evaluation  - IR consult placed for possible kyphoplasty   Active Problems: Severe constipation/obstipation with abdominal pain : With significant cecal distention -  Placed on scheduled bowel regimen, TSH normal - CT abdomen showed cholelithiasis with gaseous distention of the cecum up to 10.2 cm without wall thickening or obstruction. Requested GI consult for evaluation and recommendations (d/w [name])   Hypertension - Currently stable, continue Norvasc   Recent UTI  - Urine culture on 8/16 showed Escherichia coli, was treated with Keflex. Repeat UA on 8/23 today showed no UTI.   Left ear pain- patient complaining of left ear pain this morning ear examined with otoscope, no significant infection or discharge, placed on ciprodex otic soln   Code Status: Full code  Family Communication: Discussed in detail with the patient, all imaging results, lab results explained to the patient. Called patient's son on the phone, unable to leave  voice mail   Disposition Plan: Not medically ready  Time Spent in minutes   25 minutes  Procedures  CT abd  Consults   Interventional radiology Gastroenterology  DVT Prophylaxis  heparin subcutaneous  Medications  Scheduled Meds: . amLODipine  5 mg Oral Daily  . ciprofloxacin-dexamethasone  2 drop Left Ear BID  . docusate sodium  100 mg Oral BID  . heparin  5,000 Units Subcutaneous 3 times per day  . pantoprazole  40 mg Oral Q0600  . polyethylene glycol  17 g Oral Daily  . witch hazel-glycerin   Topical BID   Continuous Infusions: . sodium chloride 75 mL/hr at 09/28/14 0550   PRN Meds:.acetaminophen **OR** acetaminophen, alum & mag hydroxide-simeth, bisacodyl, HYDROcodone-acetaminophen, HYDROmorphone  (DILAUDID) injection, iohexol, ondansetron **OR** ondansetron (ZOFRAN) IV   Antibiotics   Anti-infectives    None        Subjective:   [name] was seen and examined today.  Complaining of pain all over, back pain, abdominal pain. Patient denies dizziness, chest pain, shortness of breath, N/V/D/C, new weakness, numbess, tingling. No acute events overnight, states did not sleep well.    Objective:   Blood pressure 154/55, pulse 77, temperature 97.7 F (36.5 C), temperature source Oral, resp. rate 18, height 5\' 6"  (1.676 m), weight 94.348 kg (208 lb), SpO2 98 %.  Wt Readings from Last 3 Encounters:  09/28/14 94.348 kg (208 lb)  09/20/14 94.348 kg (208 lb)  11/12/11 87.907 kg (193 lb 12.8 oz)     Intake/Output Summary (Last 24 hours) at 09/28/14 1040 Last data filed at 09/28/14 0550  Gross per 24 hour  Intake 956.25 ml  Output      0 ml  Net 956.25 ml    Exam  General: Alert and oriented x 3, NAD  HEENT:  PERRLA, EOMI, Anicteric Sclera, mucous membranes moist.   Neck: Supple, no JVD, no masses  CVS: S1 S2 auscultated, no rubs, murmurs or gallops. Regular rate and rhythm.  Respiratory: Clear to auscultation bilaterally, no wheezing, rales or rhonchi  Abdomen: Soft, mildly tender in the epigastric region, nondistended, + bowel sounds  Ext: no cyanosis clubbing or edema  Neuro: AAOx3, Cr N's II- XII. Strength 5/5 upper and lower extremities bilaterally  Skin: No rashes  Psych: Normal affect and demeanor, alert and oriented x3    Data Review   Micro Results Recent Results (from the past 240 hour(s))  Urine culture     Status: None   Collection Time: 09/20/14 11:11 PM  Result Value Ref Range Status   Specimen Description URINE, CLEAN CATCH  Final   Special Requests NONE  Final   Culture   Final    >=100,000 COLONIES/mL ESCHERICHIA COLI Performed at [facility]    Report Status 09/23/2014 FINAL  Final   Organism ID, Bacteria ESCHERICHIA COLI   Final      Susceptibility   Escherichia coli - MIC*    AMPICILLIN 8 SENSITIVE Sensitive     CEFAZOLIN <=4 SENSITIVE Sensitive     CEFTRIAXONE <=1 SENSITIVE Sensitive     CIPROFLOXACIN <=0.25 SENSITIVE Sensitive     GENTAMICIN <=1 SENSITIVE Sensitive     IMIPENEM <=0.25 SENSITIVE Sensitive     NITROFURANTOIN <=16 SENSITIVE Sensitive     TRIMETH/SULFA <=20 SENSITIVE Sensitive     AMPICILLIN/SULBACTAM 4 SENSITIVE Sensitive     * >=100,000 COLONIES/mL ESCHERICHIA COLI    Radiology Reports Dg Thoracic Spine 2 View  09/27/2014   CLINICAL DATA:  Upper thoracic pain for 2 weeks. Personal history of breast cancer.  EXAM: THORACIC SPINE 2 VIEWS  COMPARISON:  09/20/2014 MR. 06/18/2009 and prior chest radiographs  FINDINGS: A T11 compression fracture appears unchanged from 09/20/2014 MR with approximately 25% height loss.  No other thoracic spine fracture identified.  There is no evidence of subluxation or definite focal bony lesion.  Thoracic aortic atherosclerotic calcifications are noted.  IMPRESSION: Unchanged 25% T11 compression fracture since 09/20/2014.  No new abnormalities identified.   Electronically Signed   By: Margarette Canada M.D.   On: 09/27/2014 13:15   Mr Thoracic Spine Wo Contrast  09/20/2014   CLINICAL DATA:  Initial evaluation for acute back pain. Urinary incontinence.  EXAM: MRI THORACIC AND LUMBAR SPINE WITHOUT CONTRAST  TECHNIQUE: Multiplanar and multiecho pulse sequences of the thoracic and lumbar spine were obtained without intravenous contrast.  COMPARISON:  None.  FINDINGS: MR THORACIC SPINE FINDINGS  Vertebral bodies are normally aligned with preservation of the normal thoracic kyphosis. No listhesis.  There is abnormal T1 hypo intense signal intensity with T2 hyperintense signal intensity extending through the mid and inferior aspect of the T11 vertebral body, consistent with an acute compression type fracture deformity. There is mild concavity at the inferior endplate of V40 with  approximately 25-30% of central height loss. No bony retropulsion or epidural hematoma. Mild edema within the adjacent paravertebral soft tissues. Adjacent T11-12 intervertebral disc is normal in appearance without convincing evidence for active infection. No other fracture. Vertebral body heights otherwise maintained. Bone marrow signal intensity otherwise normal.  Signal intensity within the thoracic spinal cord is normal. No cord compression or significant canal stenosis.  Degenerative disc bulging noted at the C6-7 level with resultant mild canal stenosis, incompletely evaluated on this exam. Tiny central disc protrusions present at T5-6, T6-7, and T7-8 without stenosis. Mild disc bulge at the T11-12 level with superimposed facet arthrosis. There is mild canal narrowing at this level. No significant foraminal stenosis. No other significant degenerative changes within the thoracic spine.  Other than the above-mentioned prevertebral edema at the level of the fracture, the paraspinous soft tissues within normal limits. Probable small sebaceous cyst within the subcutaneous fat of the upper back. T2 hyperintense cyst noted within the right kidney.  MR LUMBAR SPINE FINDINGS  Mild dextroscoliosis present with apex at L2-3. Vertebral bodies otherwise normally aligned with preservation of the normal lumbar lordosis. Previously noted T11 compression type fracture partially visualized. Vertebral body heights are otherwise maintained. Signal intensity within the vertebral body bone marrow otherwise normal. No other acute fracture or listhesis.  Conus medullaris terminates normally at the L1-2 level. Signal intensity within the visualized cord is normal. No cord compression.  Fatty atrophy noted within the paraspinous musculature. Paraspinous soft tissues otherwise unremarkable. Scattered T2 hyperintense cysts noted within the kidneys bilaterally.  T12-L1:  Negative.  L1-2: Degenerative disc bulge with disc desiccation and  mild intervertebral disc space narrowing. Bilateral facet arthrosis and ligamentum flavum hypertrophy. No focal disc herniation. There is resultant mild canal and lateral recess stenosis bilaterally. Mild bilateral foraminal narrowing present as well.  L2-3: Mild disc bulge with disc desiccation. Anterior endplate osteophytic spurring. Mild facet and ligamentous hypertrophy. No significant canal or foraminal stenosis.  L3-4: Disc desiccation with mild diffuse annular disc bulge, slightly eccentric to the left. No focal disc herniation. Mild facet and ligamentous hypertrophy. Resultant mild left lateral recess stenosis without significant canal narrowing. Mild left foraminal narrowing related to disc bulge and facet disease. No  significant right foraminal narrowing.  L4-5: Mild disc bulge with disc desiccation. Superimposed facet and ligamentous hypertrophy. Resultant mild canal narrowing at this level. No significant foraminal stenosis.  L5-S1: Diffuse degenerative disc bulge with disc desiccation. Shallow broad-based posterior disc protrusion with associated annular fissure. Mild facet hypertrophy. No significant canal or foraminal stenosis. The protruding disc closely approximates the transiting S1 nerve roots in the lateral recesses bilaterally without frank neural impingement.  IMPRESSION: MR THORACIC SPINE IMPRESSION  1. No cord compression or severe canal stenosis within the thoracic spine. 2. Acute compression type fracture involving the T11 mid and lower T11 vertebral body with approximately 30% of central height loss at the T11 inferior endplate. No bony retropulsion. 3. Disc bulge with facet arthrosis at T11-12 with resultant mild canal narrowing. 4. Small central disc protrusions at T5-6, T6-7, and T7-8 without stenosis.  MR LUMBAR SPINE IMPRESSION  1. No cord compression or severe canal stenosis within the lumbar spine. 2. Mild multilevel degenerative spondylolysis as detailed above without significant  canal or foraminal stenosis.   Electronically Signed   By: Jeannine Boga M.D.   On: 09/20/2014 22:50   Mr Lumbar Spine Wo Contrast  09/20/2014   CLINICAL DATA:  Initial evaluation for acute back pain. Urinary incontinence.  EXAM: MRI THORACIC AND LUMBAR SPINE WITHOUT CONTRAST  TECHNIQUE: Multiplanar and multiecho pulse sequences of the thoracic and lumbar spine were obtained without intravenous contrast.  COMPARISON:  None.  FINDINGS: MR THORACIC SPINE FINDINGS  Vertebral bodies are normally aligned with preservation of the normal thoracic kyphosis. No listhesis.  There is abnormal T1 hypo intense signal intensity with T2 hyperintense signal intensity extending through the mid and inferior aspect of the T11 vertebral body, consistent with an acute compression type fracture deformity. There is mild concavity at the inferior endplate of Z56 with approximately 25-30% of central height loss. No bony retropulsion or epidural hematoma. Mild edema within the adjacent paravertebral soft tissues. Adjacent T11-12 intervertebral disc is normal in appearance without convincing evidence for active infection. No other fracture. Vertebral body heights otherwise maintained. Bone marrow signal intensity otherwise normal.  Signal intensity within the thoracic spinal cord is normal. No cord compression or significant canal stenosis.  Degenerative disc bulging noted at the C6-7 level with resultant mild canal stenosis, incompletely evaluated on this exam. Tiny central disc protrusions present at T5-6, T6-7, and T7-8 without stenosis. Mild disc bulge at the T11-12 level with superimposed facet arthrosis. There is mild canal narrowing at this level. No significant foraminal stenosis. No other significant degenerative changes within the thoracic spine.  Other than the above-mentioned prevertebral edema at the level of the fracture, the paraspinous soft tissues within normal limits. Probable small sebaceous cyst within the  subcutaneous fat of the upper back. T2 hyperintense cyst noted within the right kidney.  MR LUMBAR SPINE FINDINGS  Mild dextroscoliosis present with apex at L2-3. Vertebral bodies otherwise normally aligned with preservation of the normal lumbar lordosis. Previously noted T11 compression type fracture partially visualized. Vertebral body heights are otherwise maintained. Signal intensity within the vertebral body bone marrow otherwise normal. No other acute fracture or listhesis.  Conus medullaris terminates normally at the L1-2 level. Signal intensity within the visualized cord is normal. No cord compression.  Fatty atrophy noted within the paraspinous musculature. Paraspinous soft tissues otherwise unremarkable. Scattered T2 hyperintense cysts noted within the kidneys bilaterally.  T12-L1:  Negative.  L1-2: Degenerative disc bulge with disc desiccation and mild intervertebral disc space narrowing. Bilateral  facet arthrosis and ligamentum flavum hypertrophy. No focal disc herniation. There is resultant mild canal and lateral recess stenosis bilaterally. Mild bilateral foraminal narrowing present as well.  L2-3: Mild disc bulge with disc desiccation. Anterior endplate osteophytic spurring. Mild facet and ligamentous hypertrophy. No significant canal or foraminal stenosis.  L3-4: Disc desiccation with mild diffuse annular disc bulge, slightly eccentric to the left. No focal disc herniation. Mild facet and ligamentous hypertrophy. Resultant mild left lateral recess stenosis without significant canal narrowing. Mild left foraminal narrowing related to disc bulge and facet disease. No significant right foraminal narrowing.  L4-5: Mild disc bulge with disc desiccation. Superimposed facet and ligamentous hypertrophy. Resultant mild canal narrowing at this level. No significant foraminal stenosis.  L5-S1: Diffuse degenerative disc bulge with disc desiccation. Shallow broad-based posterior disc protrusion with associated  annular fissure. Mild facet hypertrophy. No significant canal or foraminal stenosis. The protruding disc closely approximates the transiting S1 nerve roots in the lateral recesses bilaterally without frank neural impingement.  IMPRESSION: MR THORACIC SPINE IMPRESSION  1. No cord compression or severe canal stenosis within the thoracic spine. 2. Acute compression type fracture involving the T11 mid and lower T11 vertebral body with approximately 30% of central height loss at the T11 inferior endplate. No bony retropulsion. 3. Disc bulge with facet arthrosis at T11-12 with resultant mild canal narrowing. 4. Small central disc protrusions at T5-6, T6-7, and T7-8 without stenosis.  MR LUMBAR SPINE IMPRESSION  1. No cord compression or severe canal stenosis within the lumbar spine. 2. Mild multilevel degenerative spondylolysis as detailed above without significant canal or foraminal stenosis.   Electronically Signed   By: Jeannine Boga M.D.   On: 09/20/2014 22:50   Ct Abdomen Pelvis W Contrast  09/27/2014   CLINICAL DATA:  Worsening back pain and RIGHT-sided abdominal pain, constipation over 1 week, having constipation, history hypertension, hyperlipidemia, recent UTI  EXAM: CT ABDOMEN AND PELVIS WITH CONTRAST  TECHNIQUE: Multidetector CT imaging of the abdomen and pelvis was performed using the standard protocol following bolus administration of intravenous contrast. Sagittal and coronal MPR images reconstructed from axial data set.  CONTRAST:  139mL OMNIPAQUE IOHEXOL 300 MG/ML SOLN IV. Dilute oral contrast.  COMPARISON:  None ; correlation CT chest 06/18/2009  FINDINGS: 8 mm nodule base of lingula image 1 unchanged since 2011.  Probable minimal atelectasis at posterior medial LEFT lower lobe image 1.  Dependent gallstones in gallbladder.  Tiny BILATERAL renal cysts.  Liver, spleen, pancreas, kidneys, and adrenal glands otherwise normal appearance.  Scattered atherosclerotic calcifications.  Appendix not  visualized.  Distended cecum 10.2 cm diameter.  Few uncomplicated sigmoid diverticula.  Stomach and bowel loops otherwise normal appearance.  Atrophic uterus and ovaries.  Small amount of free intraperitoneal fluid in pelvis and at base of RIGHT pericolic gutter.  No mass, adenopathy, free air or hernia.  Diffuse osseous demineralization with orthopedic hardware proximal LEFT femur.  IMPRESSION: Cholelithiasis.  Gaseous distention of cecum up to 10.2 cm transverse without definite wall thickening or evidence of obstruction.  Tiny BILATERAL renal cysts.  Small amount of nonspecific free intraperitoneal fluid.   Electronically Signed   By: Lavonia Dana M.D.   On: 09/27/2014 18:20    CBC  Recent Labs Lab 09/27/14 1200 09/28/14 0355  WBC 5.0 4.3  HGB 13.7 12.8  HCT 40.6 38.8  PLT 262 238  MCV 89.8 91.7  MCH 30.3 30.3  MCHC 33.7 33.0  RDW 13.3 13.8  LYMPHSABS 1.0  --  MONOABS 0.6  --   EOSABS 0.0  --   BASOSABS 0.0  --     Chemistries   Recent Labs Lab 09/27/14 1200 09/28/14 0355  NA 134* 137  K 3.6 4.0  CL 103 105  CO2 24 24  GLUCOSE 98 104*  BUN 7 8  CREATININE 0.85 0.86  CALCIUM 9.2 8.8*  AST 23  --   ALT 18  --   ALKPHOS 74  --   BILITOT 0.6  --    ------------------------------------------------------------------------------------------------------------------ estimated creatinine clearance is 53.3 mL/min (by C-G formula based on Cr of 0.86). ------------------------------------------------------------------------------------------------------------------ No results for input(s): HGBA1C in the last 72 hours. ------------------------------------------------------------------------------------------------------------------ No results for input(s): CHOL, HDL, LDLCALC, TRIG, CHOLHDL, LDLDIRECT in the last 72 hours. ------------------------------------------------------------------------------------------------------------------  Recent Labs  09/28/14 0355  TSH 0.961     ------------------------------------------------------------------------------------------------------------------ No results for input(s): VITAMINB12, FOLATE, FERRITIN, TIBC, IRON, RETICCTPCT in the last 72 hours.  Coagulation profile No results for input(s): INR, PROTIME in the last 168 hours.  No results for input(s): DDIMER in the last 72 hours.  Cardiac Enzymes No results for input(s): CKMB, TROPONINI, MYOGLOBIN in the last 168 hours.  Invalid input(s): CK ------------------------------------------------------------------------------------------------------------------ Invalid input(s): POCBNP  No results for input(s): GLUCAP in the last 72 hours.   RAI,Brandi M.D. [facility] 09/28/2014, 10:40 AM  Pager: [phone] Between 7am to 7pm - call Pager - [phone]  After 7pm go to [email or web address] - password [email or web address]  Call night coverage person covering after 7pm

## 2014-09-28 NOTE — Progress Notes (Signed)
Tap water enema given with scant results except for a large volume of flatus. Patient expresses mild pain relief, states she is not hurting.

## 2014-09-28 NOTE — Progress Notes (Signed)
Patient has some redness and superficial burn from a heating pad that she used prior to admission in her lower back, allevyn dressing applied.

## 2014-09-28 NOTE — Evaluation (Deleted)
  Occupational Therapy Evaluation Patient Details Name: [name] MRN: [id number] DOB: 22-Dec-1924 Today's Date: 09/29/2014    History of Present Illness     Clinical Impression   Pt up to [facility] with walker with min assist. Son present for part of session and is available along with another son to assist pt 24/7 PRN at d/c. Pt limited more by abdominal discomfort than her back. 4/10 abdominal discomfort and 2/10 back discomfort. Will follow on acute to progress ADL independence for return home with family.    Follow Up Recommendations       Equipment Recommendations       Recommendations for Other Services       Precautions / Restrictions        Mobility Bed Mobility                  Transfers                      Balance                                            ADL                                               Vision     Perception     Praxis      Pertinent Vitals/Pain       Hand Dominance     Extremity/Trunk Assessment             Communication     Cognition                           General Comments       Exercises       Shoulder Instructions      Home Living                                          Prior Functioning/Environment               OT Diagnosis:     OT Problem List:     OT Treatment/Interventions:      OT Goals(Current goals can be found in the care plan section)    OT Frequency:     Barriers to D/C:            Co-evaluation              End of Session    Activity Tolerance:   Patient left:     Time:  -    Charges:    G-Codes:    [place], [place] 09/29/2014, 8:24 AM

## 2014-09-28 NOTE — Progress Notes (Signed)
Initial Nutrition Assessment  DOCUMENTATION CODES:   Non-severe (moderate) malnutrition in context of acute illness/injury, Obesity unspecified  INTERVENTION:   -Provide Boost Breeze po TID, each supplement provides 250 kcal and 9 grams of protein -Ordered lunch tray for patient -Encourage PO intake -RD to continue to monitor  NUTRITION DIAGNOSIS:   Inadequate oral intake related to  (abdominal pain) as evidenced by  (Poor PO intake x 10 days PTA).  GOAL:   Patient will meet greater than or equal to 90% of their needs  MONITOR:   PO intake, Supplement acceptance, Labs, Weight trends, Skin, I & O's  REASON FOR ASSESSMENT:   Consult Poor PO  ASSESSMENT:   81 y.o. female with hypertension, osteoarthritis, hyperlipidemia, recent UTI, chronic back pain presented to ED with worsening back pain, right-sided abdominal pain, constipation over the last 1-2 weeks. History was obtained from the patient and her son at the bedside.   Pt in room with son at bedside. Per son, pt's appetite has been poor for the past month and her last solid meal was the 8/14 (10 days ago). Pt has been having constipation.  Pt on clear liquid diet today. Pt's son requested RD order tray for patient. RD provided jello for patient in room. RD to order clear liquid supplement as well.   Partial Nutrition-Focused physical exam completed as pt remained covered up for most of visit. Findings are mild fat depletion, mild muscle depletion, and mild edema.   Labs reviewed.  Diet Order:  Diet clear liquid Room service appropriate?: Yes; Fluid consistency:: Thin  Skin:  Reviewed, no issues  Last BM:  8/23  Height:   Ht Readings from Last 1 Encounters:  09/28/14 5\' 6"  (1.676 m)    Weight:   Wt Readings from Last 1 Encounters:  09/28/14 208 lb (94.348 kg)    Ideal Body Weight:  59.1 kg  BMI:  Body mass index is 33.59 kg/(m^2).  Estimated Nutritional Needs:   Kcal:  1500-1700  Protein:   80-90g  Fluid:  1.6L/day  EDUCATION NEEDS:   Education needs no appropriate at this time  Clayton Bibles, MS, RD, LDN Pager: [phone] After Hours Pager: [phone]

## 2014-09-28 NOTE — Consult Note (Signed)
Referring Provider: Dr. Tana Coast Primary Care Physician:  Tereasa Coop, PA-C Primary Gastroenterologist:  None, [place] primary  Reason for Consultation:  Dilated/distended cecum; constipation  HPI: [name] is a 81 y.o. female with hypertension, osteoarthritis, hyperlipidemia, recent UTI, chronic back pain presented to ED with worsening back pain, right-sided abdominal pain, constipation over the last 1-2 weeks. History was obtained from the patient and her son at the bedside. Patient lives with her son. Per patient's son, her back pain started around August 12th in the lower back, worse with movement. Patient's son also reported that it all started with significant constipation around the same time. She had no bowel movement for about a week. Patient presented to the ED on 8/16. She was found to have Escherichia coli UTI and was placed on Keflex. For the constipation, patient was given a prescription for MiraLAX and recommended to follow up with PCP. MRI of the thoracic and lumbar spine did show acute T11 compression fracture. Patient was discharged home, however, she continued to have back pain. Patient's son reported that finally with the MiraLAX, patient did have several bowel movements.  She also endorsed having right-sided abdominal pain, no nausea or vomiting or any fevers or chills.  Returned to the ED on 8/23.  Family was concerned about the constipation issue.  CT scan was ordered to rule out mechanical obstruction and showed the following:  "IMPRESSION: Cholelithiasis.   Gaseous distention of cecum up to 10.2 cm transverse without definite wall thickening or evidence of obstruction.   Tiny BILATERAL renal cysts.   Small amount of nonspecific free intraperitoneal fluid."  She was having a lot of abdominal bloating/distention as well.  Her son says that it has improved but is still present.  Never had a colonoscopy.  Denies seeing blood in her stools.  CBC, BMP, TSH ok.    Past  Medical History  Diagnosis Date  . Psoriasis     scalp  . Cancer     basal cells on back, shoulder and nose  . Chicken pox as a child  . Mumps as a child  . Measles as a child  . Hypertension   . Fainting spell     X 1 time, at home in hospital 3 days  . Broken leg 1996  . Arthritis     cervical, spurs  . Neck pain, chronic   . History of shingles   . Seborrhea capitis   . Seborrheic keratosis   . Pericarditis   . Back pain 05/16/2011  . DOE (dyspnea on exertion) 05/16/2011  . Preventative health care 05/16/2011  . Hyperlipidemia 06/19/2011  . UTI (lower urinary tract infection) 06/23/2011  . Hyponatremia 11/12/2011  . Sacroiliac dysfunction 11/12/2011    Past Surgical History  Procedure Laterality Date  . [place]itice  1981    [place]  . Back disc repair  1979    low back with LLE radiculopathy  . Double knee replacement  1990  . Anaphalactic shock  1990    due to drug Voltaren  . Breast lumpectomy  1947    left  . Cataract extraction, bilateral    . Tubal ligation    . Biopsy left leg  10-23-11    benign    Prior to Admission medications   Medication Sig Start Date End Date Taking? Authorizing Provider  acetaminophen (TYLENOL) 500 MG tablet Take 1,000 mg by mouth every 4 (four) hours as needed for mild pain, moderate pain or headache (Max 4  doses in 24 hours).    Yes Historical Provider, MD  amLODipine (NORVASC) 5 MG tablet Take 1 tablet (5 mg total) by mouth daily. 05/16/11  Yes Mosie Lukes, MD  cephALEXin (KEFLEX) 500 MG capsule Take 500 mg by mouth 2 (two) times daily. 5 day course 09/24/14  Yes Historical Provider, MD  doxycycline (VIBRAMYCIN) 50 MG capsule Take 50 mg by mouth daily. As needed for rosecea 09/23/14  Yes Historical Provider, MD  polyethylene glycol powder (MIRALAX) powder TAKE 6 CAPFULS OF MIRALAX IN A 32 OUNCE GATORADE AND DRINK THE WHOLE BEVERAGE FOLLOWED BY 3 CAPFULS TWICE A DAY FOR THE NEXT WEEK AND FOLLOW UP WITH YOUR PRIMARY CARE PHYSICIAN. 09/20/14   Yes Leo Grosser, MD    Current Facility-Administered Medications  Medication Dose Route Frequency Provider Last Rate Last Dose  . 0.9 %  sodium chloride infusion   Intravenous Continuous Ripudeep Krystal Eaton, MD 75 mL/hr at 09/28/14 0550    . acetaminophen (TYLENOL) tablet 650 mg  650 mg Oral Q6H PRN Ripudeep Krystal Eaton, MD   650 mg at 09/28/14 8527   Or  . acetaminophen (TYLENOL) suppository 650 mg  650 mg Rectal Q6H PRN Ripudeep Krystal Eaton, MD      . alum & mag hydroxide-simeth (MAALOX/MYLANTA) 200-200-20 MG/5ML suspension 30 mL  30 mL Oral Q6H PRN Ripudeep K Rai, MD      . amLODipine (NORVASC) tablet 5 mg  5 mg Oral Daily Ripudeep K Rai, MD      . bisacodyl (DULCOLAX) suppository 10 mg  10 mg Rectal Daily PRN Ripudeep Krystal Eaton, MD      . ciprofloxacin-dexamethasone (CIPRODEX) 0.3-0.1 % otic suspension 2 drop  2 drop Left Ear BID Ripudeep K Rai, MD      . docusate sodium (COLACE) capsule 100 mg  100 mg Oral BID Ripudeep K Rai, MD   100 mg at 09/27/14 2153  . heparin injection 5,000 Units  5,000 Units Subcutaneous 3 times per day Ripudeep Krystal Eaton, MD   5,000 Units at 09/28/14 0550  . HYDROcodone-acetaminophen (NORCO/VICODIN) 5-325 MG per tablet 1-2 tablet  1-2 tablet Oral Q4H PRN Ripudeep K Rai, MD      . HYDROmorphone (DILAUDID) injection 1 mg  1 mg Intravenous Q4H PRN Ripudeep Krystal Eaton, MD   1 mg at 09/27/14 1704  . iohexol (OMNIPAQUE) 300 MG/ML solution 25 mL  25 mL Oral Once PRN Medication Radiologist, MD      . ondansetron (ZOFRAN) tablet 4 mg  4 mg Oral Q6H PRN Ripudeep K Rai, MD       Or  . ondansetron (ZOFRAN) injection 4 mg  4 mg Intravenous Q6H PRN Ripudeep K Rai, MD      . pantoprazole (PROTONIX) EC tablet 40 mg  40 mg Oral Q0600 Ripudeep K Rai, MD      . polyethylene glycol (MIRALAX / GLYCOLAX) packet 17 g  17 g Oral Daily Ripudeep K Rai, MD   17 g at 09/28/14 0840  . witch hazel-glycerin (TUCKS) pad   Topical BID Ripudeep Krystal Eaton, MD   1 application at 78/24/23 2155    Allergies as of 09/27/2014 -  Review Complete 09/27/2014  Allergen Reaction Noted  . Diclofenac sodium Anaphylaxis 05/16/2011  . Ibuprofen Anaphylaxis 05/16/2011  . Lisinopril  05/16/2011  . Adhesive [tape]  05/16/2011  . Codeine Nausea Only 05/16/2011    Family History  Problem Relation Age of Onset  . Cancer Mother     ovarian  .  Cancer Father     pancreatic  . Heart disease Son   . Diabetes Son     type 2  . COPD Son     smoked  . Emphysema Son     Social History   Social History  . Marital Status: Widowed    Spouse Name: N/A  . Number of Children: N/A  . Years of Education: N/A   Occupational History  . Not on file.   Social History Main Topics  . Smoking status: Former Smoker -- 41 years    Types: Cigarettes    Quit date: 02/05/1996  . Smokeless tobacco: Never Used  . Alcohol Use: No  . Drug Use: No  . Sexual Activity: No   Other Topics Concern  . Not on file   Social History Narrative    Review of Systems: Ten point ROS is O/W negative except as mentioned in HPI.  Physical Exam: Vital signs in last 24 hours: Temp:  [97.7 F (36.5 C)-98.2 F (36.8 C)] 97.7 F (36.5 C) (08/24 0448) Pulse Rate:  [77-93] 77 (08/24 0448) Resp:  [18-20] 18 (08/24 0448) BP: (120-167)/(49-93) 154/55 mmHg (08/24 0448) SpO2:  [96 %-99 %] 98 % (08/24 0448) Weight:  [208 lb (94.348 kg)] 208 lb (94.348 kg) (08/24 0300) Last BM Date: 09/27/14 General:  Alert, Well-developed, well-nourished, pleasant and cooperative in NAD Head:  Normocephalic and atraumatic. Eyes:  Sclera clear, no icterus.  Conjunctiva pink. Ears:  Normal auditory acuity. Mouth:  No deformity or lesions.   Lungs:  Clear throughout to auscultation.  No wheezes, crackles, or rhonchi.  Heart:  Regular rate and rhythm; no murmurs, clicks, rubs, or gallops. Abdomen:  Distended but soft.  BS present, tinkling.  TTP in epigastrium and RLQ without R/R/G. Rectal:  Deferred  Msk:  Symmetrical without gross deformities. Pulses:  Normal  pulses noted. Extremities:  Without clubbing or edema. Neurologic:  Alert and  oriented x4;  grossly normal neurologically. Skin:  Intact without significant lesions or rashes. Psych:  Alert and cooperative. Normal mood and affect.  Intake/Output from previous day: 08/23 0701 - 08/24 0700 In: 956.3 [I.V.:956.3] Out: -   Lab Results:  Recent Labs  09/27/14 1200 09/28/14 0355  WBC 5.0 4.3  HGB 13.7 12.8  HCT 40.6 38.8  PLT 262 238   BMET  Recent Labs  09/27/14 1200 09/28/14 0355  NA 134* 137  K 3.6 4.0  CL 103 105  CO2 24 24  GLUCOSE 98 104*  BUN 7 8  CREATININE 0.85 0.86  CALCIUM 9.2 8.8*   LFT  Recent Labs  09/27/14 1200  PROT 7.1  ALBUMIN 3.8  AST 23  ALT 18  ALKPHOS 74  BILITOT 0.6   Studies/Results: Dg Thoracic Spine 2 View  09/27/2014   CLINICAL DATA:  Upper thoracic pain for 2 weeks. Personal history of breast cancer.  EXAM: THORACIC SPINE 2 VIEWS  COMPARISON:  09/20/2014 MR. 06/18/2009 and prior chest radiographs  FINDINGS: A T11 compression fracture appears unchanged from 09/20/2014 MR with approximately 25% height loss.  No other thoracic spine fracture identified.  There is no evidence of subluxation or definite focal bony lesion.  Thoracic aortic atherosclerotic calcifications are noted.  IMPRESSION: Unchanged 25% T11 compression fracture since 09/20/2014.  No new abnormalities identified.   Electronically Signed   By: Margarette Canada M.D.   On: 09/27/2014 13:15   Ct Abdomen Pelvis W Contrast  09/27/2014   CLINICAL DATA:  Worsening back pain and RIGHT-sided  abdominal pain, constipation over 1 week, having constipation, history hypertension, hyperlipidemia, recent UTI  EXAM: CT ABDOMEN AND PELVIS WITH CONTRAST  TECHNIQUE: Multidetector CT imaging of the abdomen and pelvis was performed using the standard protocol following bolus administration of intravenous contrast. Sagittal and coronal MPR images reconstructed from axial data set.  CONTRAST:  183mL  OMNIPAQUE IOHEXOL 300 MG/ML SOLN IV. Dilute oral contrast.  COMPARISON:  None ; correlation CT chest 06/18/2009  FINDINGS: 8 mm nodule base of lingula image 1 unchanged since 2011.  Probable minimal atelectasis at posterior medial LEFT lower lobe image 1.  Dependent gallstones in gallbladder.  Tiny BILATERAL renal cysts.  Liver, spleen, pancreas, kidneys, and adrenal glands otherwise normal appearance.  Scattered atherosclerotic calcifications.  Appendix not visualized.  Distended cecum 10.2 cm diameter.  Few uncomplicated sigmoid diverticula.  Stomach and bowel loops otherwise normal appearance.  Atrophic uterus and ovaries.  Small amount of free intraperitoneal fluid in pelvis and at base of RIGHT pericolic gutter.  No mass, adenopathy, free air or hernia.  Diffuse osseous demineralization with orthopedic hardware proximal LEFT femur.  IMPRESSION: Cholelithiasis.  Gaseous distention of cecum up to 10.2 cm transverse without definite wall thickening or evidence of obstruction.  Tiny BILATERAL renal cysts.  Small amount of nonspecific free intraperitoneal fluid.   Electronically Signed   By: Lavonia Dana M.D.   On: 09/27/2014 18:20   IMPRESSION:  -81 year old female with constipation, RLQ abdominal pain:  CT scan showing cecal distention to 10.2 cm.   -Back pain:  Has acute compression fracture and other findings on imaging.  PLAN: -Minimize narcotics, maintain potassium WNL's (4 or >), move/ambulate patient (know that this some of these recommendations will be difficult due to her back pain). -Will check plain films today. -Will give tap-water enemas x 2. -Ideally should be NPO, but will allow clear liquids since she has no nausea or vomiting.  ZEHR, JESSICA D.  09/28/2014, 9:10 AM  Pager number [phone]  GI ATTENDING (note filed daily secondary to Epic malfunction yesterday)  History, laboratories, x-rays reviewed. Patient personally seen and examined. Agree with comprehensive consultation note as  outlined above. Multiple family members in room. At least female who presents with acute compression fracture of her back. Also complained of constipation and abdominal discomfort. Recent UTI for which she was treated. Initial CT shows slight distention of cecum. Plain films of the abdomen today underwhelming with minimal cecal dilation. Physical exam reveals a benign abdomen except for some mild right-sided discomfort. Right-sided stool noted. Plan purge with magnesium citrate. Agree with increased activity as tolerated, minimizing narcotics, and follow-up x-rays. Aware of incidental gallstones. Discussed with family.  Docia Chuck. Geri Seminole., M.D. [facility] Division of Gastroenterology

## 2014-09-29 ENCOUNTER — Observation Stay (HOSPITAL_COMMUNITY): Payer: Medicare Other

## 2014-09-29 DIAGNOSIS — Z87891 Personal history of nicotine dependence: Secondary | ICD-10-CM | POA: Diagnosis not present

## 2014-09-29 DIAGNOSIS — M545 Low back pain: Secondary | ICD-10-CM | POA: Diagnosis present

## 2014-09-29 DIAGNOSIS — M549 Dorsalgia, unspecified: Secondary | ICD-10-CM | POA: Diagnosis present

## 2014-09-29 DIAGNOSIS — Z8041 Family history of malignant neoplasm of ovary: Secondary | ICD-10-CM | POA: Diagnosis not present

## 2014-09-29 DIAGNOSIS — R1084 Generalized abdominal pain: Secondary | ICD-10-CM | POA: Diagnosis not present

## 2014-09-29 DIAGNOSIS — Z8619 Personal history of other infectious and parasitic diseases: Secondary | ICD-10-CM | POA: Diagnosis not present

## 2014-09-29 DIAGNOSIS — K567 Ileus, unspecified: Secondary | ICD-10-CM | POA: Diagnosis not present

## 2014-09-29 DIAGNOSIS — R935 Abnormal findings on diagnostic imaging of other abdominal regions, including retroperitoneum: Secondary | ICD-10-CM | POA: Diagnosis not present

## 2014-09-29 DIAGNOSIS — K560 Paralytic ileus: Secondary | ICD-10-CM | POA: Diagnosis present

## 2014-09-29 DIAGNOSIS — X58XXXA Exposure to other specified factors, initial encounter: Secondary | ICD-10-CM | POA: Diagnosis present

## 2014-09-29 DIAGNOSIS — H9202 Otalgia, left ear: Secondary | ICD-10-CM | POA: Diagnosis present

## 2014-09-29 DIAGNOSIS — Z885 Allergy status to narcotic agent status: Secondary | ICD-10-CM | POA: Diagnosis not present

## 2014-09-29 DIAGNOSIS — M1990 Unspecified osteoarthritis, unspecified site: Secondary | ICD-10-CM | POA: Diagnosis present

## 2014-09-29 DIAGNOSIS — E785 Hyperlipidemia, unspecified: Secondary | ICD-10-CM | POA: Diagnosis present

## 2014-09-29 DIAGNOSIS — Z79899 Other long term (current) drug therapy: Secondary | ICD-10-CM | POA: Diagnosis not present

## 2014-09-29 DIAGNOSIS — K5901 Slow transit constipation: Secondary | ICD-10-CM | POA: Diagnosis not present

## 2014-09-29 DIAGNOSIS — Z87440 Personal history of urinary (tract) infections: Secondary | ICD-10-CM | POA: Diagnosis not present

## 2014-09-29 DIAGNOSIS — Z888 Allergy status to other drugs, medicaments and biological substances status: Secondary | ICD-10-CM | POA: Diagnosis not present

## 2014-09-29 DIAGNOSIS — Z886 Allergy status to analgesic agent status: Secondary | ICD-10-CM | POA: Diagnosis not present

## 2014-09-29 DIAGNOSIS — Z833 Family history of diabetes mellitus: Secondary | ICD-10-CM | POA: Diagnosis not present

## 2014-09-29 DIAGNOSIS — M4854XA Collapsed vertebra, not elsewhere classified, thoracic region, initial encounter for fracture: Secondary | ICD-10-CM | POA: Diagnosis present

## 2014-09-29 DIAGNOSIS — K5900 Constipation, unspecified: Secondary | ICD-10-CM | POA: Diagnosis not present

## 2014-09-29 DIAGNOSIS — Z96659 Presence of unspecified artificial knee joint: Secondary | ICD-10-CM | POA: Diagnosis present

## 2014-09-29 DIAGNOSIS — Z808 Family history of malignant neoplasm of other organs or systems: Secondary | ICD-10-CM | POA: Diagnosis not present

## 2014-09-29 DIAGNOSIS — R1031 Right lower quadrant pain: Secondary | ICD-10-CM | POA: Diagnosis not present

## 2014-09-29 DIAGNOSIS — Z825 Family history of asthma and other chronic lower respiratory diseases: Secondary | ICD-10-CM | POA: Diagnosis not present

## 2014-09-29 DIAGNOSIS — I10 Essential (primary) hypertension: Secondary | ICD-10-CM | POA: Diagnosis present

## 2014-09-29 LAB — BASIC METABOLIC PANEL
ANION GAP: 7 (ref 5–15)
BUN: 5 mg/dL — ABNORMAL LOW (ref 6–20)
CO2: 26 mmol/L (ref 22–32)
Calcium: 9.3 mg/dL (ref 8.9–10.3)
Chloride: 107 mmol/L (ref 101–111)
Creatinine, Ser: 1.06 mg/dL — ABNORMAL HIGH (ref 0.44–1.00)
GFR calc Af Amer: 53 mL/min — ABNORMAL LOW (ref >60)
GFR, EST NON AFRICAN AMERICAN: 46 mL/min — AB (ref >60)
GLUCOSE: 108 mg/dL — AB (ref 65–99)
POTASSIUM: 3.7 mmol/L (ref 3.5–5.1)
SODIUM: 140 mmol/L (ref 135–145)

## 2014-09-29 LAB — CBC
HCT: 39.4 % (ref 36.0–46.0)
Hemoglobin: 12.7 g/dL (ref 12.0–15.0)
MCH: 30 pg (ref 26.0–34.0)
MCHC: 32.2 g/dL (ref 30.0–36.0)
MCV: 92.9 fL (ref 78.0–100.0)
PLATELETS: 265 10*3/uL (ref 150–400)
RBC: 4.24 MIL/uL (ref 3.87–5.11)
RDW: 14.1 % (ref 11.5–15.5)
WBC: 5.9 10*3/uL (ref 4.0–10.5)

## 2014-09-29 MED ORDER — HEPARIN SODIUM (PORCINE) 5000 UNIT/ML IJ SOLN
5000.0000 [IU] | Freq: Three times a day (TID) | INTRAMUSCULAR | Status: AC
  Administered 2014-09-29 (×2): 5000 [IU] via SUBCUTANEOUS
  Filled 2014-09-29 (×2): qty 1

## 2014-09-29 MED ORDER — HEPARIN SODIUM (PORCINE) 5000 UNIT/ML IJ SOLN
5000.0000 [IU] | Freq: Three times a day (TID) | INTRAMUSCULAR | Status: DC
  Administered 2014-10-01: 5000 [IU] via SUBCUTANEOUS
  Filled 2014-09-29 (×4): qty 1

## 2014-09-29 NOTE — Care Management Note (Signed)
Case Management Note  Patient Details  Name: [name] MRN: [id number] Date of Birth: 1924/07/12  Subjective/Objective:              81 yo admitted with Back and abd pain.      Action/Plan: From home with son [name] and plans to go back home.  Expected Discharge Date:   (unknown)               Expected Discharge Plan:  [place]  In-House Referral:     Discharge planning Services  CM Consult  Post Acute Care Choice:    Choice offered to:  Patient, Adult Children  DME Arranged:    DME Agency:     HH Arranged:  PT, OT HH Agency:  [facility]  Status of Service:  In process, will continue to follow  Medicare Important Message Given:    Date Medicare IM Given:    Medicare IM give by:    Date Additional Medicare IM Given:    Additional Medicare Important Message give by:     If discussed at [place] of Stay Meetings, dates discussed:    Additional Comments: This CM met with pt and son [name] at bedside for disposition planning. OT is recommending HHOT at DC. Pt and son offered choice for agencies and chose [facility]. [name] also inquiring about a wheelchair or transfer chair for pt when she gets home but thinks she might need to be evaluated in the home to see which would be the best fit for her and the environment. Will need MD order for wheelchair. Order for PT eval placed and CM will await PT recommendations to continue with disposition planning. [facility] alerted of referral and will follow pt during hospital stay. CM will continue to follow. Lynnell Catalan, RN 09/29/2014, 1:05 PM

## 2014-09-29 NOTE — Progress Notes (Signed)
09/29/14 4854  OT Visit Information  Last OT Received On 09/28/14  Assistance Needed +1  History of Present Illness Patient is a 81 year old female with hypertension, osteoarthritis, hyperlipidemia, recent UTI, chronic back pain presented to ED with worsening back pain, right-sided abdominal pain, constipation over the last 1 week. Pt with T11 compression fracture. Pt with PMH for back surgery, bilateral knee replacement, HTN, neck pain.    Precautions  Precautions Fall;Back  Precaution Comments back for comfort.  Restrictions  Weight Bearing Restrictions No  Home Living  Family/patient expects to be discharged to: Private residence  Living Arrangements Children  Available Help at Discharge Family;Available 24 hours/day  Type of Home House  Home Access Stairs to enter  Entrance Stairs-Number of Steps 3  Entrance Stairs-Rails Left  Home Layout One level  Bathroom Programmer, applications  Home Equipment [facility];Shower seat;Cane - single point;Walker - 2 wheels  Prior Function  Level of Independence Needs assistance  Gait / Transfers Assistance Needed this past week since back pain has been present, pt used walker to pivot to [facility]. limited ambulation. Prior to back pain onset, pt ambulating around home without device.   ADL's / Homemaking Assistance Needed Pt states she has been sponge bathing since the back pain X 1 week. she has pivoted to [facility] mostly. Son assisted PRN. Prior to onset of back pain she was able to do all ADL but did have assist with meals, household tasks.   Communication  Communication No difficulties  Pain Assessment  Pain Assessment 0-10  Pain Score 4  Pain Location 4 in abdomen, 2 in back  Pain Descriptors / Indicators Discomfort  Pain Intervention(s) Repositioned  Cognition  Arousal/Alertness Awake/alert  Behavior During Therapy WFL for tasks assessed/performed  Overall Cognitive Status Within Functional Limits for tasks assessed  Upper  Extremity Assessment  Upper Extremity Assessment Overall WFL for tasks assessed  ADL  Overall ADL's  Needs assistance/impaired  Eating/Feeding Independent;Sitting  Eating/Feeding Details (indicate cue type and reason) clear liquid.  Grooming Wash/dry hands;Set up;Sitting  Upper Body Bathing Set up;Sitting  Lower Body Bathing Moderate assistance;Sit to/from stand  Upper Body Dressing  Set up;Sitting  Lower Body Dressing Sit to/from stand;Total assistance  Lower Body Dressing Details (indicate cue type and reason) min assist sit to stand aspect.  Toilet Transfer Minimal assistance;Stand-pivot;BSC;RW  Toileting- Clothing Manipulation and Hygiene Moderate assistance;Sit to/from stand  General ADL Comments Pt stating abdominal pain 4/10 with movement and 2/10 back discomfort. Pt wanting to try to have BM so pivoted to [facility]. Nursing came to do enema. Pt tolerated scooting to edge of bedside commode to have enema done. Pt with urgency to void when first up to [facility] so OT had to assist with clothing management/moving gown away due to urgent need to void. Pt also required assist for LB dressing due to back discomfort. Educated on log roll for comfort with bed moblity.   Vision- History  Patient Visual Report No change from baseline  Bed Mobility  Overal bed mobility Needs Assistance  Bed Mobility Rolling;Sidelying to Sit;Sit to Sidelying  Rolling Min assist  Sidelying to sit Min assist  Sit to sidelying Min assist  General bed mobility comments cues for log roll technique and hand placement.  Transfers  Overall transfer level Needs assistance  Equipment used Rolling walker (2 wheeled)  Transfers Sit to/from Stand  Sit to Stand Min assist  General transfer comment cues for hand placement. min assist to rise and steady.  OT - End of Session  Equipment Utilized During Treatment Gait belt;Rolling walker  Activity Tolerance Patient tolerated treatment well  Patient left in bed;with call bell/phone  within reach  OT Assessment  OT Therapy Diagnosis  Generalized weakness  OT Recommendation/Assessment Patient needs continued OT Services  OT Problem List Decreased strength;Decreased knowledge of use of DME or AE  OT Plan  OT Frequency (ACUTE ONLY) Min 2X/week  OT Treatment/Interventions (ACUTE ONLY) Self-care/ADL training;Patient/family education;Therapeutic activities;DME and/or AE instruction  OT Recommendation  Follow Up Recommendations Home health OT;Supervision/Assistance - 24 hour  OT Equipment None recommended by OT  Individuals Consulted  Consulted and Agree with Results and Recommendations Patient  Acute Rehab OT Goals  Patient Stated Goal return to independence.   OT Goal Formulation With patient  Time For Goal Achievement 10/12/14  Potential to Achieve Goals Good  OT Time Calculation  OT Start Time (ACUTE ONLY) 1038  OT Stop Time (ACUTE ONLY) 1108  OT Time Calculation (min) 30 min  OT G-codes **NOT FOR INPATIENT CLASS**  Functional Assessment Tool Used clinical judgement  Functional Limitation Self care  Self Care Current Status (V9166) CL  Self Care Goal Status (M6004) CI  OT General Charges  $OT Visit 1 Procedure  OT Evaluation  $Initial OT Evaluation Tier I 1 Procedure  OT Treatments  $Therapeutic Activity 8-22 mins  Late entry for 09/28/14.  Pauline Aus OTR/L [phone] 09/29/2014

## 2014-09-29 NOTE — Plan of Care (Signed)
Problem: Phase III Progression Outcomes Goal: Pain controlled on oral analgesia Outcome: Progressing Taking tylenol for pain.

## 2014-09-29 NOTE — Progress Notes (Signed)
Patient's son requests that no procedures be discussed without a family member here with patient. Son also requests that no procedures be done on patient until bowel problem resolved.

## 2014-09-29 NOTE — Plan of Care (Signed)
Problem: Phase I Progression Outcomes Goal: Pain controlled with appropriate interventions Outcome: Progressing Pain in back with activitiy.

## 2014-09-29 NOTE — Plan of Care (Signed)
Problem: Phase I Progression Outcomes Goal: OOB as tolerated unless otherwise ordered Outcome: Progressing oob to chair and bsc with minimal assist.

## 2014-09-29 NOTE — Progress Notes (Signed)
[facility] Progress Note  Subjective:  X-ray today shows partial distal small bowel obstruction.  Received enemas and some mag citrate.  Having several BM's and passing flatus.  No nausea or vomiting; tolerating clears.  Appears comfortable.  Says just some abdominal soreness.  Son is very upset that procedure for compression fracture was scheduled and planned without consulting him and family.  Objective:  Vital signs in last 24 hours: Temp:  [97.8 F (36.6 C)-98.4 F (36.9 C)] 97.8 F (36.6 C) (08/25 0535) Pulse Rate:  [80-89] 89 (08/25 0535) Resp:  [18-20] 18 (08/25 0535) BP: (137-154)/(56-70) 154/68 mmHg (08/25 0535) SpO2:  [96 %-100 %] 96 % (08/25 0535) Last BM Date: 09/27/14 (pta son reports > 1 week ) General:  Alert, Well-developed, in NAD Heart:  Regular rate and rhythm; no murmurs Pulm:  CTAB.  No W/R/R. Abdomen:  Softly distended.  BS present.  Still some right sided TTP but seems slightly less compared to yesterday. Extremities:  Without edema. Neurologic:  Alert and  oriented x4;  grossly normal neurologically. Psych:  Alert and cooperative. Normal mood and affect.  Intake/Output from previous day: 08/24 0701 - 08/25 0700 In: 1071 [P.O.:120; I.V.:951] Out: -   Lab Results:  Recent Labs  09/27/14 1200 09/28/14 0355 09/29/14 0355  WBC 5.0 4.3 5.9  HGB 13.7 12.8 12.7  HCT 40.6 38.8 39.4  PLT 262 238 265   BMET  Recent Labs  09/27/14 1200 09/28/14 0355 09/29/14 0355  NA 134* 137 140  K 3.6 4.0 3.7  CL 103 105 107  CO2 24 24 26   GLUCOSE 98 104* 108*  BUN 7 8 5*  CREATININE 0.85 0.86 1.06*  CALCIUM 9.2 8.8* 9.3   LFT  Recent Labs  09/27/14 1200  PROT 7.1  ALBUMIN 3.8  AST 23  ALT 18  ALKPHOS 74  BILITOT 0.6   PT/INR  Recent Labs  09/28/14 1302  LABPROT 13.7  INR 1.03   Dg Thoracic Spine 2 View  09/27/2014   CLINICAL DATA:  Upper thoracic pain for 2 weeks. Personal history of breast cancer.  EXAM: THORACIC SPINE  2 VIEWS  COMPARISON:  09/20/2014 MR. 06/18/2009 and prior chest radiographs  FINDINGS: A T11 compression fracture appears unchanged from 09/20/2014 MR with approximately 25% height loss.  No other thoracic spine fracture identified.  There is no evidence of subluxation or definite focal bony lesion.  Thoracic aortic atherosclerotic calcifications are noted.  IMPRESSION: Unchanged 25% T11 compression fracture since 09/20/2014.  No new abnormalities identified.   Electronically Signed   By: Margarette Canada M.D.   On: 09/27/2014 13:15   Dg Abd 1 View  09/29/2014   CLINICAL DATA:  Abdominal pain and distension, bowels are moving.  EXAM: ABDOMEN - 1 VIEW  COMPARISON:  Abdominal films of September 28, 2014  FINDINGS: There remain numerous loops of mildly distended gas-filled small bowel throughout the abdomen and in the upper pelvis. No colonic obstruction is observed. There is gas in the descending colon. No free extraluminal gas collections are demonstrated.  IMPRESSION: Findings consistent with a partial distal small bowel obstruction. The volume of gas present within small bowel is slightly greater today. No colonic obstruction is observed.   Electronically Signed   By: David  Martinique M.D.   On: 09/29/2014 09:18   Ct Abdomen Pelvis W Contrast  09/27/2014   CLINICAL DATA:  Worsening back pain and RIGHT-sided abdominal pain, constipation over 1 week, having constipation, history hypertension,  hyperlipidemia, recent UTI  EXAM: CT ABDOMEN AND PELVIS WITH CONTRAST  TECHNIQUE: Multidetector CT imaging of the abdomen and pelvis was performed using the standard protocol following bolus administration of intravenous contrast. Sagittal and coronal MPR images reconstructed from axial data set.  CONTRAST:  155mL OMNIPAQUE IOHEXOL 300 MG/ML SOLN IV. Dilute oral contrast.  COMPARISON:  None ; correlation CT chest 06/18/2009  FINDINGS: 8 mm nodule base of lingula image 1 unchanged since 2011.  Probable minimal atelectasis at posterior  medial LEFT lower lobe image 1.  Dependent gallstones in gallbladder.  Tiny BILATERAL renal cysts.  Liver, spleen, pancreas, kidneys, and adrenal glands otherwise normal appearance.  Scattered atherosclerotic calcifications.  Appendix not visualized.  Distended cecum 10.2 cm diameter.  Few uncomplicated sigmoid diverticula.  Stomach and bowel loops otherwise normal appearance.  Atrophic uterus and ovaries.  Small amount of free intraperitoneal fluid in pelvis and at base of RIGHT pericolic gutter.  No mass, adenopathy, free air or hernia.  Diffuse osseous demineralization with orthopedic hardware proximal LEFT femur.  IMPRESSION: Cholelithiasis.  Gaseous distention of cecum up to 10.2 cm transverse without definite wall thickening or evidence of obstruction.  Tiny BILATERAL renal cysts.  Small amount of nonspecific free intraperitoneal fluid.   Electronically Signed   By: Lavonia Dana M.D.   On: 09/27/2014 18:20   Dg Abd 2 Views  09/28/2014   CLINICAL DATA:  Generalized abdominal pain secondary to constipation.  EXAM: ABDOMEN - 2 VIEW  COMPARISON:  CT scan of September 27, 2014.  FINDINGS: No small bowel dilatation is noted. Air-filled colon is noted. Cecum measures 7.2 cm in maximum measured diameter. There is no evidence of free air. Residual contrast and stool is noted in the right colon. No radio-opaque calculi or other significant radiographic abnormality is seen.  IMPRESSION: Moderate residual stool seen in right colon suggesting constipation. Mild cecal dilatation is noted.   Electronically Signed   By: Marijo Conception, M.D.   On: 09/28/2014 11:48    Assessment / Plan: *81 year old female with constipation, RLQ abdominal pain: CT scan showing cecal distention to 10.2 cm.  X-rays today show partial distal small bowel obstruction.  *Back pain: Has acute compression fracture and other findings on imaging.  -Minimize narcotics, maintain potassium WNL's (4 or >), move/ambulate patient (know that this  some of these recommendations will be difficult due to her back pain). -? Check plain films again tomorrow. -Ideally should be NPO, but will allow clear liquids since she has no nausea or vomiting. -Family DOES NOT want compression fracture fixed right now.   LOS: 2 days   ZEHR, JESSICA D.  09/29/2014, 10:09 AM  Pager number [phone]   GI ATTENDING  Interval history and data personally reviewed. Agree with interval progress note as outlined above. Clinically stable. No significant colonic dilation. X-rays personally reviewed. Increased small bowel gas consistent with ileus (likely not consistent with bowel obstruction). Continue with measures as outlined. Repeat x-rays in a.m.  Docia Chuck. Geri Seminole., M.D. [facility] of Gastroenterology

## 2014-09-29 NOTE — Progress Notes (Signed)
Patient's insurance does not require pre-authorization and her T11 VP/KP procedure is scheduled at [facility] 8/26 at 11am, orders placed to have floor RN arrange carelink to have patient to [facility] by 10am tomorrow. All other orders placed.  Tsosie Billing PA-C Interventional Radiology  09/29/14  10:06 AM

## 2014-09-29 NOTE — Progress Notes (Addendum)
[facility]                                                                              Patient Demographics  [name], is a 81 y.o. female, DOB - 1924-08-25, BBC:[id number]  Admit date - 09/27/2014   Admitting Physician Sanchez Hemmer Krystal Eaton, MD  Outpatient Primary MD for the patient is Tereasa Coop, PA-C  LOS - 2   Chief Complaint  Patient presents with  . Back Pain       Brief HPI   Patient is a 81 year old female with hypertension, osteoarthritis, hyperlipidemia, recent UTI, chronic back pain presented to ED with worsening back pain, right-sided abdominal pain, constipation over the last 1 week. History was obtained from the patient and her son at the bedside. Patient lives with her son. Per patient's son, her back pain started around August 12th in the lower back, worse with movement. Patient's son also reported that it all started with significant constipation around the same time. She had no bowel movement for about a week. Patient presented to the ED on 8/16. She was found to have Escherichia coli UTI and was placed on Keflex. For the constipation, patient was given a prescription for MiraLAX and recommended to follow up with PCP. MRI of the thoracic and lumbar spine did show acute T11 compression fracture. Patient was discharged home however she continued to have back pain. Patient's son reported that finally with the MiraLAX, patient did have bowel movements. Patient describes the back pain as 8/10 today, sharp and worse with movement, in the lower back. She also endorsed having right-sided abdominal pain, no nausea or vomiting or any fevers or chills. Patient's son also reported that she had one dose of Zanaflex muscle relaxant prescribed by her PCP, after which she had a severe reaction and was unresponsive.    Assessment & Plan   Principal Problem: Acute lower Back pain: feels better today, a cute 11 compression fracture - MRI of the thoracic and lumbar spine  showed acute compression fracture involving the T11 mid and lower vertebral body with 30% central height loss, disc bulge at T11 and 12, no cord compression or severe canal stenosis in the thoracic or lumbar spine. - placed on pain control with bowel regimen  - PTOT evaluation  - Patient's son requested if Dr Vertell Limber (neurosurgery) can see her, I have called the office  - Discussed in detail with the patient's son at the bedside and the patient, they want to cancel the kyphoplasty procedure that is scheduled for tomorrow. Patient's states that they're more concerned about her abdominal issues and they will see her orthopedic physician after she is discharged from the hospital for further workup regarding the back pain. - I have notified interventional radiology regarding patient's decision to cancel the procedure   Active Problems: Severe constipation/obstipation with abdominal pain : With significant cecal distention -Patient is having multiple bowel movements, currently on bowel regimen, TSH is normal - CT abdomen showed cholelithiasis with gaseous distention of the cecum up to 10.2 cm without wall thickening or obstruction. bdominal x-ray showed moderate residual stool in in the right colon with  mild cecal dilatation of 7.2 cm - abdominal x-ray this morning showed partial distal small bowel obstruction, no colonic obstruction. GI following, recommended continue clear liquid diet and bowel regimen.     Recent UTI  - Urine culture on 8/16 showed Escherichia coli, was treated with Keflex. Repeat UA on 8/23 today showed no UTI.   Left ear pain- resolved ear examined with otoscope, no significant infection or discharge, placed on ciprodex otic soln   Code Status: Full code  Family Communication: Discussed in detail with the patient, all imaging results, lab results explained to the patient. Discussed in detail with the son at the bedside and separately per his request     Disposition Plan:  Not medically ready  Time Spent in minutes   25 minutes  Procedures  CT abd  Consults   Interventional radiology Gastroenterology  DVT Prophylaxis  heparin subcutaneous  Medications  Scheduled Meds: . amLODipine  5 mg Oral Daily  . ciprofloxacin-dexamethasone  2 drop Left Ear BID  . docusate sodium  100 mg Oral BID  . feeding supplement  1 Container Oral TID BM  . heparin  5,000 Units Subcutaneous 3 times per day  . pantoprazole  40 mg Oral Q0600  . polyethylene glycol  17 g Oral Daily  . witch hazel-glycerin   Topical BID   Continuous Infusions: . sodium chloride Stopped (09/28/14 2048)   PRN Meds:.acetaminophen **OR** acetaminophen, alum & mag hydroxide-simeth, bisacodyl, HYDROcodone-acetaminophen, HYDROmorphone (DILAUDID) injection, iohexol, ondansetron **OR** ondansetron (ZOFRAN) IV   Antibiotics   Anti-infectives    None        Subjective:   [name] was seen and examined today. Feeling better today, sitting up. States had BM today. Back pain is also improving. No abd pain currently. Patient denies dizziness, chest pain, shortness of breath, N/V/D/C, new weakness, numbess, tingling. No acute events overnight.    Objective:   Blood pressure 154/68, pulse 89, temperature 97.8 F (36.6 C), temperature source Oral, resp. rate 18, height 5\' 6"  (1.676 m), weight 94.348 kg (208 lb), SpO2 96 %.  Wt Readings from Last 3 Encounters:  09/28/14 94.348 kg (208 lb)  09/20/14 94.348 kg (208 lb)  11/12/11 87.907 kg (193 lb 12.8 oz)     Intake/Output Summary (Last 24 hours) at 09/29/14 0947 Last data filed at 09/28/14 2130  Gross per 24 hour  Intake   1071 ml  Output      0 ml  Net   1071 ml    Exam  General: Alert and oriented x 3, NAD  HEENT:  PERRLA, EOMI  Neck: Supple, no JVD, no masses  CVS: S1 S2clear, RRR  Respiratory: CTAB  Abdomen: Soft, NT, ND, NBS  Ext: no cyanosis clubbing or edema  Neuro: no new deficits  Skin: No rashes  Psych:  Normal affect and demeanor, alert and oriented x3    Data Review   Micro Results Recent Results (from the past 240 hour(s))  Urine culture     Status: None   Collection Time: 09/20/14 11:11 PM  Result Value Ref Range Status   Specimen Description URINE, CLEAN CATCH  Final   Special Requests NONE  Final   Culture   Final    >=100,000 COLONIES/mL ESCHERICHIA COLI Performed at [facility]    Report Status 09/23/2014 FINAL  Final   Organism ID, Bacteria ESCHERICHIA COLI  Final      Susceptibility   Escherichia coli - MIC*    AMPICILLIN 8 SENSITIVE  Sensitive     CEFAZOLIN <=4 SENSITIVE Sensitive     CEFTRIAXONE <=1 SENSITIVE Sensitive     CIPROFLOXACIN <=0.25 SENSITIVE Sensitive     GENTAMICIN <=1 SENSITIVE Sensitive     IMIPENEM <=0.25 SENSITIVE Sensitive     NITROFURANTOIN <=16 SENSITIVE Sensitive     TRIMETH/SULFA <=20 SENSITIVE Sensitive     AMPICILLIN/SULBACTAM 4 SENSITIVE Sensitive     * >=100,000 COLONIES/mL ESCHERICHIA COLI    Radiology Reports Dg Thoracic Spine 2 View  09/27/2014   CLINICAL DATA:  Upper thoracic pain for 2 weeks. Personal history of breast cancer.  EXAM: THORACIC SPINE 2 VIEWS  COMPARISON:  09/20/2014 MR. 06/18/2009 and prior chest radiographs  FINDINGS: A T11 compression fracture appears unchanged from 09/20/2014 MR with approximately 25% height loss.  No other thoracic spine fracture identified.  There is no evidence of subluxation or definite focal bony lesion.  Thoracic aortic atherosclerotic calcifications are noted.  IMPRESSION: Unchanged 25% T11 compression fracture since 09/20/2014.  No new abnormalities identified.   Electronically Signed   By: Margarette Canada M.D.   On: 09/27/2014 13:15   Dg Abd 1 View  09/29/2014   CLINICAL DATA:  Abdominal pain and distension, bowels are moving.  EXAM: ABDOMEN - 1 VIEW  COMPARISON:  Abdominal films of September 28, 2014  FINDINGS: There remain numerous loops of mildly distended gas-filled small bowel throughout  the abdomen and in the upper pelvis. No colonic obstruction is observed. There is gas in the descending colon. No free extraluminal gas collections are demonstrated.  IMPRESSION: Findings consistent with a partial distal small bowel obstruction. The volume of gas present within small bowel is slightly greater today. No colonic obstruction is observed.   Electronically Signed   By: David  Martinique M.D.   On: 09/29/2014 09:18   Mr Thoracic Spine Wo Contrast  09/20/2014   CLINICAL DATA:  Initial evaluation for acute back pain. Urinary incontinence.  EXAM: MRI THORACIC AND LUMBAR SPINE WITHOUT CONTRAST  TECHNIQUE: Multiplanar and multiecho pulse sequences of the thoracic and lumbar spine were obtained without intravenous contrast.  COMPARISON:  None.  FINDINGS: MR THORACIC SPINE FINDINGS  Vertebral bodies are normally aligned with preservation of the normal thoracic kyphosis. No listhesis.  There is abnormal T1 hypo intense signal intensity with T2 hyperintense signal intensity extending through the mid and inferior aspect of the T11 vertebral body, consistent with an acute compression type fracture deformity. There is mild concavity at the inferior endplate of T55 with approximately 25-30% of central height loss. No bony retropulsion or epidural hematoma. Mild edema within the adjacent paravertebral soft tissues. Adjacent T11-12 intervertebral disc is normal in appearance without convincing evidence for active infection. No other fracture. Vertebral body heights otherwise maintained. Bone marrow signal intensity otherwise normal.  Signal intensity within the thoracic spinal cord is normal. No cord compression or significant canal stenosis.  Degenerative disc bulging noted at the C6-7 level with resultant mild canal stenosis, incompletely evaluated on this exam. Tiny central disc protrusions present at T5-6, T6-7, and T7-8 without stenosis. Mild disc bulge at the T11-12 level with superimposed facet arthrosis. There is  mild canal narrowing at this level. No significant foraminal stenosis. No other significant degenerative changes within the thoracic spine.  Other than the above-mentioned prevertebral edema at the level of the fracture, the paraspinous soft tissues within normal limits. Probable small sebaceous cyst within the subcutaneous fat of the upper back. T2 hyperintense cyst noted within the right kidney.  MR LUMBAR  SPINE FINDINGS  Mild dextroscoliosis present with apex at L2-3. Vertebral bodies otherwise normally aligned with preservation of the normal lumbar lordosis. Previously noted T11 compression type fracture partially visualized. Vertebral body heights are otherwise maintained. Signal intensity within the vertebral body bone marrow otherwise normal. No other acute fracture or listhesis.  Conus medullaris terminates normally at the L1-2 level. Signal intensity within the visualized cord is normal. No cord compression.  Fatty atrophy noted within the paraspinous musculature. Paraspinous soft tissues otherwise unremarkable. Scattered T2 hyperintense cysts noted within the kidneys bilaterally.  T12-L1:  Negative.  L1-2: Degenerative disc bulge with disc desiccation and mild intervertebral disc space narrowing. Bilateral facet arthrosis and ligamentum flavum hypertrophy. No focal disc herniation. There is resultant mild canal and lateral recess stenosis bilaterally. Mild bilateral foraminal narrowing present as well.  L2-3: Mild disc bulge with disc desiccation. Anterior endplate osteophytic spurring. Mild facet and ligamentous hypertrophy. No significant canal or foraminal stenosis.  L3-4: Disc desiccation with mild diffuse annular disc bulge, slightly eccentric to the left. No focal disc herniation. Mild facet and ligamentous hypertrophy. Resultant mild left lateral recess stenosis without significant canal narrowing. Mild left foraminal narrowing related to disc bulge and facet disease. No significant right foraminal  narrowing.  L4-5: Mild disc bulge with disc desiccation. Superimposed facet and ligamentous hypertrophy. Resultant mild canal narrowing at this level. No significant foraminal stenosis.  L5-S1: Diffuse degenerative disc bulge with disc desiccation. Shallow broad-based posterior disc protrusion with associated annular fissure. Mild facet hypertrophy. No significant canal or foraminal stenosis. The protruding disc closely approximates the transiting S1 nerve roots in the lateral recesses bilaterally without frank neural impingement.  IMPRESSION: MR THORACIC SPINE IMPRESSION  1. No cord compression or severe canal stenosis within the thoracic spine. 2. Acute compression type fracture involving the T11 mid and lower T11 vertebral body with approximately 30% of central height loss at the T11 inferior endplate. No bony retropulsion. 3. Disc bulge with facet arthrosis at T11-12 with resultant mild canal narrowing. 4. Small central disc protrusions at T5-6, T6-7, and T7-8 without stenosis.  MR LUMBAR SPINE IMPRESSION  1. No cord compression or severe canal stenosis within the lumbar spine. 2. Mild multilevel degenerative spondylolysis as detailed above without significant canal or foraminal stenosis.   Electronically Signed   By: Jeannine Boga M.D.   On: 09/20/2014 22:50   Mr Lumbar Spine Wo Contrast  09/20/2014   CLINICAL DATA:  Initial evaluation for acute back pain. Urinary incontinence.  EXAM: MRI THORACIC AND LUMBAR SPINE WITHOUT CONTRAST  TECHNIQUE: Multiplanar and multiecho pulse sequences of the thoracic and lumbar spine were obtained without intravenous contrast.  COMPARISON:  None.  FINDINGS: MR THORACIC SPINE FINDINGS  Vertebral bodies are normally aligned with preservation of the normal thoracic kyphosis. No listhesis.  There is abnormal T1 hypo intense signal intensity with T2 hyperintense signal intensity extending through the mid and inferior aspect of the T11 vertebral body, consistent with an  acute compression type fracture deformity. There is mild concavity at the inferior endplate of Z61 with approximately 25-30% of central height loss. No bony retropulsion or epidural hematoma. Mild edema within the adjacent paravertebral soft tissues. Adjacent T11-12 intervertebral disc is normal in appearance without convincing evidence for active infection. No other fracture. Vertebral body heights otherwise maintained. Bone marrow signal intensity otherwise normal.  Signal intensity within the thoracic spinal cord is normal. No cord compression or significant canal stenosis.  Degenerative disc bulging noted at the C6-7 level with  resultant mild canal stenosis, incompletely evaluated on this exam. Tiny central disc protrusions present at T5-6, T6-7, and T7-8 without stenosis. Mild disc bulge at the T11-12 level with superimposed facet arthrosis. There is mild canal narrowing at this level. No significant foraminal stenosis. No other significant degenerative changes within the thoracic spine.  Other than the above-mentioned prevertebral edema at the level of the fracture, the paraspinous soft tissues within normal limits. Probable small sebaceous cyst within the subcutaneous fat of the upper back. T2 hyperintense cyst noted within the right kidney.  MR LUMBAR SPINE FINDINGS  Mild dextroscoliosis present with apex at L2-3. Vertebral bodies otherwise normally aligned with preservation of the normal lumbar lordosis. Previously noted T11 compression type fracture partially visualized. Vertebral body heights are otherwise maintained. Signal intensity within the vertebral body bone marrow otherwise normal. No other acute fracture or listhesis.  Conus medullaris terminates normally at the L1-2 level. Signal intensity within the visualized cord is normal. No cord compression.  Fatty atrophy noted within the paraspinous musculature. Paraspinous soft tissues otherwise unremarkable. Scattered T2 hyperintense cysts noted within  the kidneys bilaterally.  T12-L1:  Negative.  L1-2: Degenerative disc bulge with disc desiccation and mild intervertebral disc space narrowing. Bilateral facet arthrosis and ligamentum flavum hypertrophy. No focal disc herniation. There is resultant mild canal and lateral recess stenosis bilaterally. Mild bilateral foraminal narrowing present as well.  L2-3: Mild disc bulge with disc desiccation. Anterior endplate osteophytic spurring. Mild facet and ligamentous hypertrophy. No significant canal or foraminal stenosis.  L3-4: Disc desiccation with mild diffuse annular disc bulge, slightly eccentric to the left. No focal disc herniation. Mild facet and ligamentous hypertrophy. Resultant mild left lateral recess stenosis without significant canal narrowing. Mild left foraminal narrowing related to disc bulge and facet disease. No significant right foraminal narrowing.  L4-5: Mild disc bulge with disc desiccation. Superimposed facet and ligamentous hypertrophy. Resultant mild canal narrowing at this level. No significant foraminal stenosis.  L5-S1: Diffuse degenerative disc bulge with disc desiccation. Shallow broad-based posterior disc protrusion with associated annular fissure. Mild facet hypertrophy. No significant canal or foraminal stenosis. The protruding disc closely approximates the transiting S1 nerve roots in the lateral recesses bilaterally without frank neural impingement.  IMPRESSION: MR THORACIC SPINE IMPRESSION  1. No cord compression or severe canal stenosis within the thoracic spine. 2. Acute compression type fracture involving the T11 mid and lower T11 vertebral body with approximately 30% of central height loss at the T11 inferior endplate. No bony retropulsion. 3. Disc bulge with facet arthrosis at T11-12 with resultant mild canal narrowing. 4. Small central disc protrusions at T5-6, T6-7, and T7-8 without stenosis.  MR LUMBAR SPINE IMPRESSION  1. No cord compression or severe canal stenosis within  the lumbar spine. 2. Mild multilevel degenerative spondylolysis as detailed above without significant canal or foraminal stenosis.   Electronically Signed   By: Jeannine Boga M.D.   On: 09/20/2014 22:50   Ct Abdomen Pelvis W Contrast  09/27/2014   CLINICAL DATA:  Worsening back pain and RIGHT-sided abdominal pain, constipation over 1 week, having constipation, history hypertension, hyperlipidemia, recent UTI  EXAM: CT ABDOMEN AND PELVIS WITH CONTRAST  TECHNIQUE: Multidetector CT imaging of the abdomen and pelvis was performed using the standard protocol following bolus administration of intravenous contrast. Sagittal and coronal MPR images reconstructed from axial data set.  CONTRAST:  155mL OMNIPAQUE IOHEXOL 300 MG/ML SOLN IV. Dilute oral contrast.  COMPARISON:  None ; correlation CT chest 06/18/2009  FINDINGS: 8 mm  nodule base of lingula image 1 unchanged since 2011.  Probable minimal atelectasis at posterior medial LEFT lower lobe image 1.  Dependent gallstones in gallbladder.  Tiny BILATERAL renal cysts.  Liver, spleen, pancreas, kidneys, and adrenal glands otherwise normal appearance.  Scattered atherosclerotic calcifications.  Appendix not visualized.  Distended cecum 10.2 cm diameter.  Few uncomplicated sigmoid diverticula.  Stomach and bowel loops otherwise normal appearance.  Atrophic uterus and ovaries.  Small amount of free intraperitoneal fluid in pelvis and at base of RIGHT pericolic gutter.  No mass, adenopathy, free air or hernia.  Diffuse osseous demineralization with orthopedic hardware proximal LEFT femur.  IMPRESSION: Cholelithiasis.  Gaseous distention of cecum up to 10.2 cm transverse without definite wall thickening or evidence of obstruction.  Tiny BILATERAL renal cysts.  Small amount of nonspecific free intraperitoneal fluid.   Electronically Signed   By: Lavonia Dana M.D.   On: 09/27/2014 18:20   Dg Abd 2 Views  09/28/2014   CLINICAL DATA:  Generalized abdominal pain secondary to  constipation.  EXAM: ABDOMEN - 2 VIEW  COMPARISON:  CT scan of September 27, 2014.  FINDINGS: No small bowel dilatation is noted. Air-filled colon is noted. Cecum measures 7.2 cm in maximum measured diameter. There is no evidence of free air. Residual contrast and stool is noted in the right colon. No radio-opaque calculi or other significant radiographic abnormality is seen.  IMPRESSION: Moderate residual stool seen in right colon suggesting constipation. Mild cecal dilatation is noted.   Electronically Signed   By: Marijo Conception, M.D.   On: 09/28/2014 11:48    CBC  Recent Labs Lab 09/27/14 1200 09/28/14 0355 09/29/14 0355  WBC 5.0 4.3 5.9  HGB 13.7 12.8 12.7  HCT 40.6 38.8 39.4  PLT 262 238 265  MCV 89.8 91.7 92.9  MCH 30.3 30.3 30.0  MCHC 33.7 33.0 32.2  RDW 13.3 13.8 14.1  LYMPHSABS 1.0  --   --   MONOABS 0.6  --   --   EOSABS 0.0  --   --   BASOSABS 0.0  --   --     Chemistries   Recent Labs Lab 09/27/14 1200 09/28/14 0355 09/29/14 0355  NA 134* 137 140  K 3.6 4.0 3.7  CL 103 105 107  CO2 24 24 26   GLUCOSE 98 104* 108*  BUN 7 8 5*  CREATININE 0.85 0.86 1.06*  CALCIUM 9.2 8.8* 9.3  AST 23  --   --   ALT 18  --   --   ALKPHOS 74  --   --   BILITOT 0.6  --   --    ------------------------------------------------------------------------------------------------------------------ estimated creatinine clearance is 43.3 mL/min (by C-G formula based on Cr of 1.06). ------------------------------------------------------------------------------------------------------------------ No results for input(s): HGBA1C in the last 72 hours. ------------------------------------------------------------------------------------------------------------------ No results for input(s): CHOL, HDL, LDLCALC, TRIG, CHOLHDL, LDLDIRECT in the last 72 hours. ------------------------------------------------------------------------------------------------------------------  Recent Labs   09/28/14 0355  TSH 0.961   ------------------------------------------------------------------------------------------------------------------ No results for input(s): VITAMINB12, FOLATE, FERRITIN, TIBC, IRON, RETICCTPCT in the last 72 hours.  Coagulation profile  Recent Labs Lab 09/28/14 1302  INR 1.03    No results for input(s): DDIMER in the last 72 hours.  Cardiac Enzymes No results for input(s): CKMB, TROPONINI, MYOGLOBIN in the last 168 hours.  Invalid input(s): CK ------------------------------------------------------------------------------------------------------------------ Invalid input(s): POCBNP  No results for input(s): GLUCAP in the last 72 hours.   Carston Riedl M.D. [facility] 09/29/2014, 9:47 AM  Pager: [phone] Between 7am  to 7pm - call Pager - [phone]  After 7pm go to [email or web address] - password [email or web address]  Call night coverage person covering after 7pm

## 2014-09-30 ENCOUNTER — Inpatient Hospital Stay (HOSPITAL_COMMUNITY): Payer: Medicare Other

## 2014-09-30 DIAGNOSIS — K567 Ileus, unspecified: Secondary | ICD-10-CM

## 2014-09-30 DIAGNOSIS — E440 Moderate protein-calorie malnutrition: Secondary | ICD-10-CM

## 2014-09-30 LAB — BASIC METABOLIC PANEL
Anion gap: 8 (ref 5–15)
BUN: 7 mg/dL (ref 6–20)
CHLORIDE: 104 mmol/L (ref 101–111)
CO2: 23 mmol/L (ref 22–32)
Calcium: 9 mg/dL (ref 8.9–10.3)
Creatinine, Ser: 1.07 mg/dL — ABNORMAL HIGH (ref 0.44–1.00)
GFR calc Af Amer: 53 mL/min — ABNORMAL LOW (ref >60)
GFR calc non Af Amer: 45 mL/min — ABNORMAL LOW (ref >60)
Glucose, Bld: 111 mg/dL — ABNORMAL HIGH (ref 65–99)
POTASSIUM: 3.9 mmol/L (ref 3.5–5.1)
SODIUM: 135 mmol/L (ref 135–145)

## 2014-09-30 LAB — CBC
HEMATOCRIT: 40.1 % (ref 36.0–46.0)
HEMOGLOBIN: 13.2 g/dL (ref 12.0–15.0)
MCH: 30.2 pg (ref 26.0–34.0)
MCHC: 32.9 g/dL (ref 30.0–36.0)
MCV: 91.8 fL (ref 78.0–100.0)
Platelets: 260 10*3/uL (ref 150–400)
RBC: 4.37 MIL/uL (ref 3.87–5.11)
RDW: 14 % (ref 11.5–15.5)
WBC: 6 10*3/uL (ref 4.0–10.5)

## 2014-09-30 NOTE — Progress Notes (Signed)
Occupational Therapy Treatment Patient Details Name: [name] MRN: [id number] DOB: [birth year] Today's Date: 09/30/2014    History of present illness Patient is a 81 year old female with hypertension, osteoarthritis, hyperlipidemia, recent UTI, chronic back pain presented to ED with worsening back pain, right-sided abdominal pain, constipation over the last 1 week. Pt with T11 compression fracture. Pt with PMH for back surgery, bilateral knee replacement, HTN, neck pain.     OT comments  Pt doing well and states she has no pain as long as she doesn't bend or twist with her back. Educated on Ae options for toilet hygiene as well as LB dressing and left information in her room and pt verbalizes understanding. She states she will discuss options with her son.    Follow Up Recommendations  Home health OT;Supervision/Assistance - 24 hour    Equipment Recommendations  None recommended by OT    Recommendations for Other Services      Precautions / Restrictions Precautions Precautions: Fall;Back Precaution Comments: back for comfort. Restrictions Weight Bearing Restrictions: No       Mobility Bed Mobility               General bed mobility comments: in chair.   Transfers Overall transfer level: Needs assistance Equipment used: Rolling walker (2 wheeled) Transfers: Sit to/from Stand Sit to Stand: Min guard         General transfer comment: cues for hand placement.    Balance                                   ADL                       Lower Body Dressing: Minimal assistance;With adaptive equipment;Sitting/lateral leans   Toilet Transfer: Min guard;Ambulation;BSC;RW   Toileting- Clothing Manipulation and Hygiene: Moderate assistance;Sit to/from stand         General ADL Comments: Pt expressed that she is having difficulty with performing toiet hygiene as she cant reach around and wipe the posterior periarea due to back discomfort.  educated on toilet aid option and coverage and wrote down info for pt/family on where to obtain AE. Also educated on LB self care AE and coverage for this also. Pt practiced with donning pants with reacher (using pillowcase as pants) and she did well with min assist to position reacher on pants/pillowcase. Pt up to 3in1 with walker and stated she felt better with functional moblity today.      Vision                     Perception     Praxis      Cognition   Behavior During Therapy: WFL for tasks assessed/performed Overall Cognitive Status: Within Functional Limits for tasks assessed                       Extremity/Trunk Assessment               Exercises     Shoulder Instructions       General Comments      Pertinent Vitals/ Pain       Pain Assessment: No/denies pain  Home Living  Prior Functioning/Environment              Frequency Min 2X/week     Progress Toward Goals  OT Goals(current goals can now be found in the care plan section)  Progress towards OT goals: Progressing toward goals     Plan Discharge plan remains appropriate    Co-evaluation                 End of Session Equipment Utilized During Treatment: Rolling walker   Activity Tolerance Patient tolerated treatment well   Patient Left in chair;with call bell/phone within reach   Nurse Communication          Time: 1005-1040 OT Time Calculation (min): 35 min  Charges: OT General Charges $OT Visit: 1 Procedure OT Treatments $Self Care/Home Management : 8-22 mins $Therapeutic Activity: 8-22 mins  Jules Schick  [phone] 09/30/2014, 10:57 AM

## 2014-09-30 NOTE — Evaluation (Signed)
Physical Therapy Evaluation Patient Details Name: [name] MRN: [id number] DOB: [birth year] Today's Date: 09/30/2014   History of Present Illness  Patient is a 81 year old female with hypertension, osteoarthritis, hyperlipidemia, recent UTI, chronic back pain presented to ED with worsening back pain, right-sided abdominal pain, constipation over the last 1 week. Pt with T11 compression fracture. Pt with PMH for back surgery, bilateral knee replacement, HTN, neck pain.    Clinical Impression  Pt admitted with above diagnosis. Pt currently with functional limitations due to the deficits listed below (see PT Problem List).  Pt will benefit from skilled PT to increase their independence and safety with mobility to allow discharge to the venue listed below.  Pt reports no back pain at time of visit however noticed more back pain with twisting with attempting OOB earlier this morning.  Reviewed back precautions for comfort and pain control as well as log roll technique.       Follow Up Recommendations No PT follow up    Equipment Recommendations  Rolling walker with 5" wheels (pt reports she has SW (no wheels), encouraged RW however she will discuss with son)    Recommendations for Other Services       Precautions / Restrictions Precautions Precautions: Fall;Back Precaution Comments: back for comfort. Restrictions Weight Bearing Restrictions: No      Mobility  Bed Mobility               General bed mobility comments: pt up in recliner on arrival however discussed log roll technique with pt able to verbalize correctly  Transfers Overall transfer level: Needs assistance Equipment used: Rolling walker (2 wheeled) Transfers: Sit to/from Stand Sit to Stand: Supervision         General transfer comment: cues for hand placement and back precautions for comfort  Ambulation/Gait Ambulation/Gait assistance: Supervision Ambulation Distance (Feet): 200 Feet Assistive device:  Rolling walker (2 wheeled) Gait Pattern/deviations: Step-through pattern     General Gait Details: verbal cues for posture and RW positioning, pt reports no back pain  Stairs            Wheelchair Mobility    Modified Rankin (Stroke Patients Only)       Balance                                             Pertinent Vitals/Pain Pain Assessment: 0-10 Pain Score: 4  Pain Location: abdomen Pain Descriptors / Indicators: Discomfort Pain Intervention(s): Limited activity within patient's tolerance;Monitored during session    Home Living Family/patient expects to be discharged to:: Private residence Living Arrangements: Children Available Help at Discharge: Family;Available 24 hours/day Type of Home: House Home Access: Stairs to enter Entrance Stairs-Rails: Left Entrance Stairs-Number of Steps: 3 Home Layout: One level Home Equipment: Bedside commode;Shower seat;Cane - single point;Walker - standard      Prior Function Level of Independence: Needs assistance   Gait / Transfers Assistance Needed: this past week since back pain has been present, pt used walker to pivot to [facility]. limited ambulation. Prior to back pain onset, pt ambulating around home without device.   ADL's / Homemaking Assistance Needed: Pt states she has been sponge bathing since the back pain X 1 week. she has pivoted to [facility] mostly. Son assisted PRN. Prior to onset of back pain she was able to do all ADL but did have assist with  meals, household tasks.         Hand Dominance        Extremity/Trunk Assessment               Lower Extremity Assessment: Overall WFL for tasks assessed         Communication   Communication: No difficulties  Cognition Arousal/Alertness: Awake/alert Behavior During Therapy: WFL for tasks assessed/performed Overall Cognitive Status: Within Functional Limits for tasks assessed                      General Comments      Exercises         Assessment/Plan    PT Assessment Patient needs continued PT services  PT Diagnosis Difficulty walking   PT Problem List Decreased mobility;Decreased activity tolerance;Decreased knowledge of use of DME  PT Treatment Interventions DME instruction;Gait training;Functional mobility training;Patient/family education;Therapeutic activities;Therapeutic exercise   PT Goals (Current goals can be found in the Care Plan section) Acute Rehab PT Goals PT Goal Formulation: With patient Time For Goal Achievement: 10/07/14 Potential to Achieve Goals: Good    Frequency Min 3X/week   Barriers to discharge        Co-evaluation               End of Session   Activity Tolerance: Patient tolerated treatment well Patient left: Other (comment) (bathroom on toilet, aware to use pull cord) Nurse Communication: Mobility status (aware pt in bathroom and to use pull cord)         Time: 7544-9201 PT Time Calculation (min) (ACUTE ONLY): 15 min   Charges:   PT Evaluation $Initial PT Evaluation Tier I: 1 Procedure     PT G Codes:        LEMYRE,KATHrine E 09/30/2014, 12:24 PM Carmelia Bake, PT, DPT 09/30/2014 Pager: [phone]

## 2014-09-30 NOTE — Progress Notes (Signed)
[facility] Progress Note  Subjective:  Repeat X-rays this AM show mild improvement in adynamic ileus.  Patient feeling somewhat better with her back and abdominal pain.  Having liquid stools.  Still on clear liquids.  Up sitting in chair and has been walking around the halls as well.  Objective:  Vital signs in last 24 hours: Temp:  [98 F (36.7 C)-98.2 F (36.8 C)] 98.2 F (36.8 C) (08/26 0622) Pulse Rate:  [90-96] 91 (08/26 0622) Resp:  [16-18] 18 (08/26 0622) BP: (124-156)/(68-95) 153/95 mmHg (08/26 0622) SpO2:  [99 %-100 %] 99 % (08/26 0622) Last BM Date: 09/29/14 General:  Alert, Well-developed, in NAD Heart:  Regular rate and rhythm; no murmurs Pulm: Abdomen:  Soft, nontender and nondistended. Normal bowel sounds, without guarding, and without rebound.   Extremities:  Without edema. Neurologic:  Alert and  oriented x4;  grossly normal neurologically. Psych:  Alert and cooperative. Normal mood and affect.  Intake/Output from previous day: 08/25 0701 - 08/26 0700 In: 69 [P.O.:780] Out: -   Lab Results:  Recent Labs  09/28/14 0355 09/29/14 0355 09/30/14 0437  WBC 4.3 5.9 6.0  HGB 12.8 12.7 13.2  HCT 38.8 39.4 40.1  PLT 238 265 260   BMET  Recent Labs  09/28/14 0355 09/29/14 0355 09/30/14 0437  NA 137 140 135  K 4.0 3.7 3.9  CL 105 107 104  CO2 24 26 23   GLUCOSE 104* 108* 111*  BUN 8 5* 7  CREATININE 0.86 1.06* 1.07*  CALCIUM 8.8* 9.3 9.0   LFT  Recent Labs  09/27/14 1200  PROT 7.1  ALBUMIN 3.8  AST 23  ALT 18  ALKPHOS 74  BILITOT 0.6   PT/INR  Recent Labs  09/28/14 1302  LABPROT 13.7  INR 1.03   Dg Abd 1 View  09/30/2014   CLINICAL DATA:  Ileus.  EXAM: ABDOMEN - 1 VIEW  COMPARISON:  09/29/2014.  CT 09/27/2014.  FINDINGS: Dilated loops of small and large bowel noted consistent adynamic ileus. Mild improvement from prior exam. No free air. No pathologic intra-abdominal calcifications. No acute bony abnormality .  Postsurgical changes left hip. Left base subsegmental atelectasis .  IMPRESSION: Findings consistent with persistent adynamic ileus. Mild improvement from prior exam.   Electronically Signed   By: [place]   On: 09/30/2014 07:09   Dg Abd 1 View  09/29/2014   CLINICAL DATA:  Abdominal pain and distension, bowels are moving.  EXAM: ABDOMEN - 1 VIEW  COMPARISON:  Abdominal films of September 28, 2014  FINDINGS: There remain numerous loops of mildly distended gas-filled small bowel throughout the abdomen and in the upper pelvis. No colonic obstruction is observed. There is gas in the descending colon. No free extraluminal gas collections are demonstrated.  IMPRESSION: Findings consistent with a partial distal small bowel obstruction. The volume of gas present within small bowel is slightly greater today. No colonic obstruction is observed.   Electronically Signed   By: David  Martinique M.D.   On: 09/29/2014 09:18   Dg Abd 2 Views  09/28/2014   CLINICAL DATA:  Generalized abdominal pain secondary to constipation.  EXAM: ABDOMEN - 2 VIEW  COMPARISON:  CT scan of September 27, 2014.  FINDINGS: No small bowel dilatation is noted. Air-filled colon is noted. Cecum measures 7.2 cm in maximum measured diameter. There is no evidence of free air. Residual contrast and stool is noted in the right colon. No radio-opaque calculi or other significant radiographic  abnormality is seen.  IMPRESSION: Moderate residual stool seen in right colon suggesting constipation. Mild cecal dilatation is noted.   Electronically Signed   By: Marijo Conception, M.D.   On: 09/28/2014 11:48   Dg Abd Portable 1v  09/29/2014   CLINICAL DATA:  Ileus.  Abdominal distention.  EXAM: PORTABLE ABDOMEN - 1 VIEW  COMPARISON:  09/29/2014  FINDINGS: There is gaseous distension of large and small bowel loops. The degree of dilatation of small bowel loops is slightly greater than that of the large bowel loops, favoring small bowel obstruction over ileus. No  free intraperitoneal air. Previous left hip ORIF.  IMPRESSION: Persistent dilatation of small bowel loops.   Electronically Signed   By: Nolon Nations M.D.   On: 09/29/2014 13:43   Assessment / Plan: *81 year old female with constipation, RLQ abdominal pain: CT scan initially showing cecal distention to 10.2 cm. X-rays c/w adynamic ileus; mild improvement on imaging today. *Back pain: Has acute compression fracture and other findings on imaging.  -Minimize narcotics, maintain potassium WNL's (4 or >), move/ambulate patient. -? Check plain films again tomorrow. -? When to advance diet.   LOS: 3 days   ZEHR, JESSICA D.  09/30/2014, 9:13 AM  Pager number [phone]   GI ATTENDING  Interval history and data, including x-ray, reviewed. Patient personally seen and examined. Sitting in chair looking comfortable. Still with some vague abdominal discomfort, though less. Tolerating diet without vomiting or increased pain. Moving bowels (loose). Experiencing back and flank pain. Abdominal exam benign. X-ray without significant dilation. As previous, recommend minimizing narcotics, maintain normal potassium, and increasing activity. At this point, advance diet as tolerated. No need for further x-rays unless clinical deterioration. We will sign off, but are available if needed. Thank you  Docia Chuck. Geri Seminole., M.D. [facility] Division of Gastroenterology

## 2014-09-30 NOTE — Care Management Important Message (Signed)
Important Message  Patient Details  Name: [name] MRN: [id number] Date of Birth: May 19, 1924   Medicare Important Message Given:  Yes-second notification given    Camillo Flaming 09/30/2014, 1:19 PMImportant Message  Patient Details  Name: [name] MRN: [id number] Date of Birth: 1924-07-21   Medicare Important Message Given:  Yes-second notification given    Camillo Flaming 09/30/2014, 1:19 PM

## 2014-09-30 NOTE — Progress Notes (Signed)
[facility]                                                                              Patient Demographics  [name], is a 81 y.o. female, DOB - Jul 13, 1924, POE:[id number]  Admit date - 09/27/2014   Admitting Physician Willean Schurman Krystal Eaton, MD  Outpatient Primary MD for the patient is Tereasa Coop, PA-C  LOS - 3   Chief Complaint  Patient presents with  . Back Pain       Brief HPI   Patient is a 81 year old female with hypertension, osteoarthritis, hyperlipidemia, recent UTI, chronic back pain presented to ED with worsening back pain, right-sided abdominal pain, constipation over the last 1 week. History was obtained from the patient and her son at the bedside. Patient lives with her son. Per patient's son, her back pain started around August 12th in the lower back, worse with movement. Patient's son also reported that it all started with significant constipation around the same time. She had no bowel movement for about a week. Patient presented to the ED on 8/16. She was found to have Escherichia coli UTI and was placed on Keflex. For the constipation, patient was given a prescription for MiraLAX and recommended to follow up with PCP. MRI of the thoracic and lumbar spine did show acute T11 compression fracture. Patient was discharged home however she continued to have back pain. Patient's son reported that finally with the MiraLAX, patient did have bowel movements. Patient describes the back pain as 8/10 today, sharp and worse with movement, in the lower back. She also endorsed having right-sided abdominal pain, no nausea or vomiting or any fevers or chills. Patient's son also reported that she had one dose of Zanaflex muscle relaxant prescribed by her PCP, after which she had a severe reaction and was unresponsive.    Assessment & Plan   Principal Problem: Severe constipation/obstipation with abdominal pain/adynamic ileus : With significant cecal distention - Patient  is having bowel movements as charted by the nursing staff, currently on bowel regimen, TSH is normal. No nausea or vomiting - CT abdomen showed cholelithiasis with gaseous distention of the cecum up to 10.2 cm without wall thickening or obstruction. bdominal x-ray showed moderate residual stool in in the right colon with mild cecal dilatation of 7.2 cm - Serial abdominal x-rays, consistent with persistent adynamic ileus  - GI following, will follow recommendations - Needs to be up in the chair, ambulate, will be difficult due to back pain  Acute lower Back pain: feels better today, a cute 11 compression fracture - MRI of the thoracic and lumbar spine showed acute compression fracture involving the T11 mid and lower vertebral body with 30% central height loss, disc bulge at T11 and 12, no cord compression or severe canal stenosis in the thoracic or lumbar spine. - placed on pain control with bowel regimen  - PTOT evaluation -> home health - Patient and son declined any further workup for the back pain or kyphoplasty procedure, requested that they will follow with her orthopedic physician after she is discharged from the hospital   Recent UTI  - Urine culture  on 8/16 showed Escherichia coli, was treated with Keflex. Repeat UA on 8/23 today showed no UTI.   Left ear pain- resolved Ear was examined with otoscope, no significant infection or discharge, placed on ciprodex otic soln   Code Status: Full code  Family Communication: Discussed in detail with the patient, all imaging results, lab results explained to the patient. I have been discussing with the patient's son at the bedside every day.   Disposition Plan: Not medically ready  Time Spent in minutes   25 minutes  Procedures  CT abd  Consults   Interventional radiology Gastroenterology  DVT Prophylaxis  heparin subcutaneous  Medications  Scheduled Meds: . amLODipine  5 mg Oral Daily  . ciprofloxacin-dexamethasone  2 drop  Left Ear BID  . docusate sodium  100 mg Oral BID  . feeding supplement  1 Container Oral TID BM  . [START ON 10/01/2014] heparin  5,000 Units Subcutaneous 3 times per day  . pantoprazole  40 mg Oral Q0600  . polyethylene glycol  17 g Oral Daily  . witch hazel-glycerin   Topical BID   Continuous Infusions: . sodium chloride Stopped (09/28/14 2048)   PRN Meds:.acetaminophen **OR** acetaminophen, alum & mag hydroxide-simeth, bisacodyl, HYDROcodone-acetaminophen, HYDROmorphone (DILAUDID) injection, iohexol, ondansetron **OR** ondansetron (ZOFRAN) IV   Antibiotics   Anti-infectives    None        Subjective:   Lynnet Hefley was seen and examined today. Having BMs, no nausea and vomiting, back pain stable. Patient denies dizziness, chest pain, shortness of breath, N/V/D/C, new weakness, numbess, tingling. No acute events overnight.    Objective:   Blood pressure 153/95, pulse 91, temperature 98.2 F (36.8 C), temperature source Oral, resp. rate 18, height 5\' 6"  (1.676 m), weight 94.348 kg (208 lb), SpO2 99 %.  Wt Readings from Last 3 Encounters:  09/28/14 94.348 kg (208 lb)  09/20/14 94.348 kg (208 lb)  11/12/11 87.907 kg (193 lb 12.8 oz)     Intake/Output Summary (Last 24 hours) at 09/30/14 1106 Last data filed at 09/30/14 0600  Gross per 24 hour  Intake    780 ml  Output      0 ml  Net    780 ml    Exam  General: Alert and oriented x 3, NAD  HEENT:  PERRLA, EOMI  Neck: Supple, no JVD, no masses  CVS: S1 S2clear, RRR  Respiratory: Clear to auscultation bilaterally  Abdomen: Soft, nontender, mild distended, normal bowel sounds  Ext: no cyanosis clubbing or edema  Neuro: no new deficits  Skin: No rashes  Psych: Normal affect and demeanor, alert and oriented x3    Data Review   Micro Results Recent Results (from the past 240 hour(s))  Urine culture     Status: None   Collection Time: 09/20/14 11:11 PM  Result Value Ref Range Status   Specimen  Description URINE, CLEAN CATCH  Final   Special Requests NONE  Final   Culture   Final    >=100,000 COLONIES/mL ESCHERICHIA COLI Performed at [facility]    Report Status 09/23/2014 FINAL  Final   Organism ID, Bacteria ESCHERICHIA COLI  Final      Susceptibility   Escherichia coli - MIC*    AMPICILLIN 8 SENSITIVE Sensitive     CEFAZOLIN <=4 SENSITIVE Sensitive     CEFTRIAXONE <=1 SENSITIVE Sensitive     CIPROFLOXACIN <=0.25 SENSITIVE Sensitive     GENTAMICIN <=1 SENSITIVE Sensitive     IMIPENEM <=0.25 SENSITIVE  Sensitive     NITROFURANTOIN <=16 SENSITIVE Sensitive     TRIMETH/SULFA <=20 SENSITIVE Sensitive     AMPICILLIN/SULBACTAM 4 SENSITIVE Sensitive     * >=100,000 COLONIES/mL ESCHERICHIA COLI    Radiology Reports Dg Thoracic Spine 2 View  09/27/2014   CLINICAL DATA:  Upper thoracic pain for 2 weeks. Personal history of breast cancer.  EXAM: THORACIC SPINE 2 VIEWS  COMPARISON:  09/20/2014 MR. 06/18/2009 and prior chest radiographs  FINDINGS: A T11 compression fracture appears unchanged from 09/20/2014 MR with approximately 25% height loss.  No other thoracic spine fracture identified.  There is no evidence of subluxation or definite focal bony lesion.  Thoracic aortic atherosclerotic calcifications are noted.  IMPRESSION: Unchanged 25% T11 compression fracture since 09/20/2014.  No new abnormalities identified.   Electronically Signed   By: Margarette Canada M.D.   On: 09/27/2014 13:15   Dg Abd 1 View  09/30/2014   CLINICAL DATA:  Ileus.  EXAM: ABDOMEN - 1 VIEW  COMPARISON:  09/29/2014.  CT 09/27/2014.  FINDINGS: Dilated loops of small and large bowel noted consistent adynamic ileus. Mild improvement from prior exam. No free air. No pathologic intra-abdominal calcifications. No acute bony abnormality . Postsurgical changes left hip. Left base subsegmental atelectasis .  IMPRESSION: Findings consistent with persistent adynamic ileus. Mild improvement from prior exam.   Electronically  Signed   By: [place]   On: 09/30/2014 07:09   Dg Abd 1 View  09/29/2014   CLINICAL DATA:  Abdominal pain and distension, bowels are moving.  EXAM: ABDOMEN - 1 VIEW  COMPARISON:  Abdominal films of September 28, 2014  FINDINGS: There remain numerous loops of mildly distended gas-filled small bowel throughout the abdomen and in the upper pelvis. No colonic obstruction is observed. There is gas in the descending colon. No free extraluminal gas collections are demonstrated.  IMPRESSION: Findings consistent with a partial distal small bowel obstruction. The volume of gas present within small bowel is slightly greater today. No colonic obstruction is observed.   Electronically Signed   By: David  Martinique M.D.   On: 09/29/2014 09:18   Mr Thoracic Spine Wo Contrast  09/20/2014   CLINICAL DATA:  Initial evaluation for acute back pain. Urinary incontinence.  EXAM: MRI THORACIC AND LUMBAR SPINE WITHOUT CONTRAST  TECHNIQUE: Multiplanar and multiecho pulse sequences of the thoracic and lumbar spine were obtained without intravenous contrast.  COMPARISON:  None.  FINDINGS: MR THORACIC SPINE FINDINGS  Vertebral bodies are normally aligned with preservation of the normal thoracic kyphosis. No listhesis.  There is abnormal T1 hypo intense signal intensity with T2 hyperintense signal intensity extending through the mid and inferior aspect of the T11 vertebral body, consistent with an acute compression type fracture deformity. There is mild concavity at the inferior endplate of M01 with approximately 25-30% of central height loss. No bony retropulsion or epidural hematoma. Mild edema within the adjacent paravertebral soft tissues. Adjacent T11-12 intervertebral disc is normal in appearance without convincing evidence for active infection. No other fracture. Vertebral body heights otherwise maintained. Bone marrow signal intensity otherwise normal.  Signal intensity within the thoracic spinal cord is normal. No cord  compression or significant canal stenosis.  Degenerative disc bulging noted at the C6-7 level with resultant mild canal stenosis, incompletely evaluated on this exam. Tiny central disc protrusions present at T5-6, T6-7, and T7-8 without stenosis. Mild disc bulge at the T11-12 level with superimposed facet arthrosis. There is mild canal narrowing at this level. No significant  foraminal stenosis. No other significant degenerative changes within the thoracic spine.  Other than the above-mentioned prevertebral edema at the level of the fracture, the paraspinous soft tissues within normal limits. Probable small sebaceous cyst within the subcutaneous fat of the upper back. T2 hyperintense cyst noted within the right kidney.  MR LUMBAR SPINE FINDINGS  Mild dextroscoliosis present with apex at L2-3. Vertebral bodies otherwise normally aligned with preservation of the normal lumbar lordosis. Previously noted T11 compression type fracture partially visualized. Vertebral body heights are otherwise maintained. Signal intensity within the vertebral body bone marrow otherwise normal. No other acute fracture or listhesis.  Conus medullaris terminates normally at the L1-2 level. Signal intensity within the visualized cord is normal. No cord compression.  Fatty atrophy noted within the paraspinous musculature. Paraspinous soft tissues otherwise unremarkable. Scattered T2 hyperintense cysts noted within the kidneys bilaterally.  T12-L1:  Negative.  L1-2: Degenerative disc bulge with disc desiccation and mild intervertebral disc space narrowing. Bilateral facet arthrosis and ligamentum flavum hypertrophy. No focal disc herniation. There is resultant mild canal and lateral recess stenosis bilaterally. Mild bilateral foraminal narrowing present as well.  L2-3: Mild disc bulge with disc desiccation. Anterior endplate osteophytic spurring. Mild facet and ligamentous hypertrophy. No significant canal or foraminal stenosis.  L3-4: Disc  desiccation with mild diffuse annular disc bulge, slightly eccentric to the left. No focal disc herniation. Mild facet and ligamentous hypertrophy. Resultant mild left lateral recess stenosis without significant canal narrowing. Mild left foraminal narrowing related to disc bulge and facet disease. No significant right foraminal narrowing.  L4-5: Mild disc bulge with disc desiccation. Superimposed facet and ligamentous hypertrophy. Resultant mild canal narrowing at this level. No significant foraminal stenosis.  L5-S1: Diffuse degenerative disc bulge with disc desiccation. Shallow broad-based posterior disc protrusion with associated annular fissure. Mild facet hypertrophy. No significant canal or foraminal stenosis. The protruding disc closely approximates the transiting S1 nerve roots in the lateral recesses bilaterally without frank neural impingement.  IMPRESSION: MR THORACIC SPINE IMPRESSION  1. No cord compression or severe canal stenosis within the thoracic spine. 2. Acute compression type fracture involving the T11 mid and lower T11 vertebral body with approximately 30% of central height loss at the T11 inferior endplate. No bony retropulsion. 3. Disc bulge with facet arthrosis at T11-12 with resultant mild canal narrowing. 4. Small central disc protrusions at T5-6, T6-7, and T7-8 without stenosis.  MR LUMBAR SPINE IMPRESSION  1. No cord compression or severe canal stenosis within the lumbar spine. 2. Mild multilevel degenerative spondylolysis as detailed above without significant canal or foraminal stenosis.   Electronically Signed   By: Jeannine Boga M.D.   On: 09/20/2014 22:50   Mr Lumbar Spine Wo Contrast  09/20/2014   CLINICAL DATA:  Initial evaluation for acute back pain. Urinary incontinence.  EXAM: MRI THORACIC AND LUMBAR SPINE WITHOUT CONTRAST  TECHNIQUE: Multiplanar and multiecho pulse sequences of the thoracic and lumbar spine were obtained without intravenous contrast.  COMPARISON:   None.  FINDINGS: MR THORACIC SPINE FINDINGS  Vertebral bodies are normally aligned with preservation of the normal thoracic kyphosis. No listhesis.  There is abnormal T1 hypo intense signal intensity with T2 hyperintense signal intensity extending through the mid and inferior aspect of the T11 vertebral body, consistent with an acute compression type fracture deformity. There is mild concavity at the inferior endplate of H41 with approximately 25-30% of central height loss. No bony retropulsion or epidural hematoma. Mild edema within the adjacent paravertebral soft tissues. Adjacent  T11-12 intervertebral disc is normal in appearance without convincing evidence for active infection. No other fracture. Vertebral body heights otherwise maintained. Bone marrow signal intensity otherwise normal.  Signal intensity within the thoracic spinal cord is normal. No cord compression or significant canal stenosis.  Degenerative disc bulging noted at the C6-7 level with resultant mild canal stenosis, incompletely evaluated on this exam. Tiny central disc protrusions present at T5-6, T6-7, and T7-8 without stenosis. Mild disc bulge at the T11-12 level with superimposed facet arthrosis. There is mild canal narrowing at this level. No significant foraminal stenosis. No other significant degenerative changes within the thoracic spine.  Other than the above-mentioned prevertebral edema at the level of the fracture, the paraspinous soft tissues within normal limits. Probable small sebaceous cyst within the subcutaneous fat of the upper back. T2 hyperintense cyst noted within the right kidney.  MR LUMBAR SPINE FINDINGS  Mild dextroscoliosis present with apex at L2-3. Vertebral bodies otherwise normally aligned with preservation of the normal lumbar lordosis. Previously noted T11 compression type fracture partially visualized. Vertebral body heights are otherwise maintained. Signal intensity within the vertebral body bone marrow otherwise  normal. No other acute fracture or listhesis.  Conus medullaris terminates normally at the L1-2 level. Signal intensity within the visualized cord is normal. No cord compression.  Fatty atrophy noted within the paraspinous musculature. Paraspinous soft tissues otherwise unremarkable. Scattered T2 hyperintense cysts noted within the kidneys bilaterally.  T12-L1:  Negative.  L1-2: Degenerative disc bulge with disc desiccation and mild intervertebral disc space narrowing. Bilateral facet arthrosis and ligamentum flavum hypertrophy. No focal disc herniation. There is resultant mild canal and lateral recess stenosis bilaterally. Mild bilateral foraminal narrowing present as well.  L2-3: Mild disc bulge with disc desiccation. Anterior endplate osteophytic spurring. Mild facet and ligamentous hypertrophy. No significant canal or foraminal stenosis.  L3-4: Disc desiccation with mild diffuse annular disc bulge, slightly eccentric to the left. No focal disc herniation. Mild facet and ligamentous hypertrophy. Resultant mild left lateral recess stenosis without significant canal narrowing. Mild left foraminal narrowing related to disc bulge and facet disease. No significant right foraminal narrowing.  L4-5: Mild disc bulge with disc desiccation. Superimposed facet and ligamentous hypertrophy. Resultant mild canal narrowing at this level. No significant foraminal stenosis.  L5-S1: Diffuse degenerative disc bulge with disc desiccation. Shallow broad-based posterior disc protrusion with associated annular fissure. Mild facet hypertrophy. No significant canal or foraminal stenosis. The protruding disc closely approximates the transiting S1 nerve roots in the lateral recesses bilaterally without frank neural impingement.  IMPRESSION: MR THORACIC SPINE IMPRESSION  1. No cord compression or severe canal stenosis within the thoracic spine. 2. Acute compression type fracture involving the T11 mid and lower T11 vertebral body with  approximately 30% of central height loss at the T11 inferior endplate. No bony retropulsion. 3. Disc bulge with facet arthrosis at T11-12 with resultant mild canal narrowing. 4. Small central disc protrusions at T5-6, T6-7, and T7-8 without stenosis.  MR LUMBAR SPINE IMPRESSION  1. No cord compression or severe canal stenosis within the lumbar spine. 2. Mild multilevel degenerative spondylolysis as detailed above without significant canal or foraminal stenosis.   Electronically Signed   By: Jeannine Boga M.D.   On: 09/20/2014 22:50   Ct Abdomen Pelvis W Contrast  09/27/2014   CLINICAL DATA:  Worsening back pain and RIGHT-sided abdominal pain, constipation over 1 week, having constipation, history hypertension, hyperlipidemia, recent UTI  EXAM: CT ABDOMEN AND PELVIS WITH CONTRAST  TECHNIQUE: Multidetector CT  imaging of the abdomen and pelvis was performed using the standard protocol following bolus administration of intravenous contrast. Sagittal and coronal MPR images reconstructed from axial data set.  CONTRAST:  163mL OMNIPAQUE IOHEXOL 300 MG/ML SOLN IV. Dilute oral contrast.  COMPARISON:  None ; correlation CT chest 06/18/2009  FINDINGS: 8 mm nodule base of lingula image 1 unchanged since 2011.  Probable minimal atelectasis at posterior medial LEFT lower lobe image 1.  Dependent gallstones in gallbladder.  Tiny BILATERAL renal cysts.  Liver, spleen, pancreas, kidneys, and adrenal glands otherwise normal appearance.  Scattered atherosclerotic calcifications.  Appendix not visualized.  Distended cecum 10.2 cm diameter.  Few uncomplicated sigmoid diverticula.  Stomach and bowel loops otherwise normal appearance.  Atrophic uterus and ovaries.  Small amount of free intraperitoneal fluid in pelvis and at base of RIGHT pericolic gutter.  No mass, adenopathy, free air or hernia.  Diffuse osseous demineralization with orthopedic hardware proximal LEFT femur.  IMPRESSION: Cholelithiasis.  Gaseous distention of  cecum up to 10.2 cm transverse without definite wall thickening or evidence of obstruction.  Tiny BILATERAL renal cysts.  Small amount of nonspecific free intraperitoneal fluid.   Electronically Signed   By: Lavonia Dana M.D.   On: 09/27/2014 18:20   Dg Abd 2 Views  09/28/2014   CLINICAL DATA:  Generalized abdominal pain secondary to constipation.  EXAM: ABDOMEN - 2 VIEW  COMPARISON:  CT scan of September 27, 2014.  FINDINGS: No small bowel dilatation is noted. Air-filled colon is noted. Cecum measures 7.2 cm in maximum measured diameter. There is no evidence of free air. Residual contrast and stool is noted in the right colon. No radio-opaque calculi or other significant radiographic abnormality is seen.  IMPRESSION: Moderate residual stool seen in right colon suggesting constipation. Mild cecal dilatation is noted.   Electronically Signed   By: Marijo Conception, M.D.   On: 09/28/2014 11:48   Dg Abd Portable 1v  09/29/2014   CLINICAL DATA:  Ileus.  Abdominal distention.  EXAM: PORTABLE ABDOMEN - 1 VIEW  COMPARISON:  09/29/2014  FINDINGS: There is gaseous distension of large and small bowel loops. The degree of dilatation of small bowel loops is slightly greater than that of the large bowel loops, favoring small bowel obstruction over ileus. No free intraperitoneal air. Previous left hip ORIF.  IMPRESSION: Persistent dilatation of small bowel loops.   Electronically Signed   By: Nolon Nations M.D.   On: 09/29/2014 13:43    CBC  Recent Labs Lab 09/27/14 1200 09/28/14 0355 09/29/14 0355 09/30/14 0437  WBC 5.0 4.3 5.9 6.0  HGB 13.7 12.8 12.7 13.2  HCT 40.6 38.8 39.4 40.1  PLT 262 238 265 260  MCV 89.8 91.7 92.9 91.8  MCH 30.3 30.3 30.0 30.2  MCHC 33.7 33.0 32.2 32.9  RDW 13.3 13.8 14.1 14.0  LYMPHSABS 1.0  --   --   --   MONOABS 0.6  --   --   --   EOSABS 0.0  --   --   --   BASOSABS 0.0  --   --   --     Chemistries   Recent Labs Lab 09/27/14 1200 09/28/14 0355 09/29/14 0355  09/30/14 0437  NA 134* 137 140 135  K 3.6 4.0 3.7 3.9  CL 103 105 107 104  CO2 24 24 26 23   GLUCOSE 98 104* 108* 111*  BUN 7 8 5* 7  CREATININE 0.85 0.86 1.06* 1.07*  CALCIUM 9.2 8.8* 9.3  9.0  AST 23  --   --   --   ALT 18  --   --   --   ALKPHOS 74  --   --   --   BILITOT 0.6  --   --   --    ------------------------------------------------------------------------------------------------------------------ estimated creatinine clearance is 42.9 mL/min (by C-G formula based on Cr of 1.07). ------------------------------------------------------------------------------------------------------------------ No results for input(s): HGBA1C in the last 72 hours. ------------------------------------------------------------------------------------------------------------------ No results for input(s): CHOL, HDL, LDLCALC, TRIG, CHOLHDL, LDLDIRECT in the last 72 hours. ------------------------------------------------------------------------------------------------------------------  Recent Labs  09/28/14 0355  TSH 0.961   ------------------------------------------------------------------------------------------------------------------ No results for input(s): VITAMINB12, FOLATE, FERRITIN, TIBC, IRON, RETICCTPCT in the last 72 hours.  Coagulation profile  Recent Labs Lab 09/28/14 1302  INR 1.03    No results for input(s): DDIMER in the last 72 hours.  Cardiac Enzymes No results for input(s): CKMB, TROPONINI, MYOGLOBIN in the last 168 hours.  Invalid input(s): CK ------------------------------------------------------------------------------------------------------------------ Invalid input(s): POCBNP  No results for input(s): GLUCAP in the last 72 hours.   Sherl Yzaguirre M.D. [facility] 09/30/2014, 11:06 AM  Pager: [phone] Between 7am to 7pm - call Pager - [phone]  After 7pm go to [email or web address] - password [email or web address]  Call night coverage person covering after  7pm

## 2014-10-01 LAB — BASIC METABOLIC PANEL
ANION GAP: 5 (ref 5–15)
BUN: 8 mg/dL (ref 6–20)
CALCIUM: 8.8 mg/dL — AB (ref 8.9–10.3)
CO2: 24 mmol/L (ref 22–32)
Chloride: 105 mmol/L (ref 101–111)
Creatinine, Ser: 0.93 mg/dL (ref 0.44–1.00)
GFR calc Af Amer: >60 mL/min (ref >60)
GFR, EST NON AFRICAN AMERICAN: 54 mL/min — AB (ref >60)
GLUCOSE: 100 mg/dL — AB (ref 65–99)
POTASSIUM: 3.9 mmol/L (ref 3.5–5.1)
SODIUM: 134 mmol/L — AB (ref 135–145)

## 2014-10-01 LAB — CBC
HCT: 37.9 % (ref 36.0–46.0)
Hemoglobin: 12.4 g/dL (ref 12.0–15.0)
MCH: 30 pg (ref 26.0–34.0)
MCHC: 32.7 g/dL (ref 30.0–36.0)
MCV: 91.8 fL (ref 78.0–100.0)
PLATELETS: 254 10*3/uL (ref 150–400)
RBC: 4.13 MIL/uL (ref 3.87–5.11)
RDW: 13.7 % (ref 11.5–15.5)
WBC: 5.2 10*3/uL (ref 4.0–10.5)

## 2014-10-01 LAB — MAGNESIUM: MAGNESIUM: 1.9 mg/dL (ref 1.7–2.4)

## 2014-10-01 MED ORDER — HYDROCODONE-ACETAMINOPHEN 5-325 MG PO TABS: 1.0000 | ORAL_TABLET | Freq: Four times a day (QID) | ORAL | Status: DC | PRN

## 2014-10-01 MED ORDER — CIPROFLOXACIN-DEXAMETHASONE 0.3-0.1 % OT SUSP: 2.0000 [drp] | Freq: Two times a day (BID) | OTIC | Status: DC

## 2014-10-01 MED ORDER — POLYETHYLENE GLYCOL 3350 17 G PO PACK: 17.0000 g | PACK | Freq: Every day | ORAL | Status: AC

## 2014-10-01 MED ORDER — PANTOPRAZOLE SODIUM 40 MG PO TBEC
40.0000 mg | DELAYED_RELEASE_TABLET | Freq: Every day | ORAL | Status: DC
Start: 2014-10-01 — End: 2018-09-17

## 2014-10-01 MED ORDER — WITCH HAZEL-GLYCERIN EX PADS: MEDICATED_PAD | Freq: Two times a day (BID) | CUTANEOUS | Status: AC

## 2014-10-01 MED ORDER — DOCUSATE SODIUM 100 MG PO CAPS: 100.0000 mg | ORAL_CAPSULE | Freq: Two times a day (BID) | ORAL | Status: AC | PRN

## 2014-10-01 MED ORDER — POLYETHYLENE GLYCOL 3350 17 G PO PACK: 17.0000 g | PACK | Freq: Every day | ORAL | Status: DC | PRN

## 2014-10-01 NOTE — Discharge Summary (Addendum)
Physician Discharge Summary   Patient ID: [name] MRN: [id number] DOB/AGE: 07/14/1924 81 y.o.  Admit date: 09/27/2014 Discharge date: 10/01/2014  Primary Care Physician:  Tereasa Coop, PA-C  Discharge Diagnoses:   . Thoracic T11 compression fracture . Abdominal pain and adynamic ileus . Back pain . Hypertension . Hyperlipidemia . Constipation . Left ear pain  Consults:  Interventional radiology Gastroenterology, Dr. Henrene Pastor   Recommendations for Outpatient Follow-up:  Patient is recommended to follow up with her orthopedic physician regarding the T11 compression fracture.   Patient was placed on bowel regimen with MiraLAX and Colace. She is recommended high-fiber diet. Follow up with gastroenterology as outpatient.  TESTS THAT NEED FOLLOW-UP None   DIET: heart healthy diet    Allergies:   Allergies  Allergen Reactions  . Diclofenac Sodium Anaphylaxis  . Ibuprofen Anaphylaxis  . Lisinopril     Fall hazard, dizzy  . Adhesive [Tape]   . Codeine Nausea Only     Discharge Medications:   Medication List    STOP taking these medications        cephALEXin 500 MG capsule  Commonly known as:  KEFLEX     doxycycline 50 MG capsule  Commonly known as:  VIBRAMYCIN     polyethylene glycol powder powder  Commonly known as:  MIRALAX  Replaced by:  polyethylene glycol packet      TAKE these medications        acetaminophen 500 MG tablet  Commonly known as:  TYLENOL  Take 1,000 mg by mouth every 4 (four) hours as needed for mild pain, moderate pain or headache (Max 4 doses in 24 hours).     amLODipine 5 MG tablet  Commonly known as:  NORVASC  Take 1 tablet (5 mg total) by mouth daily.     ciprofloxacin-dexamethasone otic suspension  Commonly known as:  CIPRODEX  Place 2 drops into the left ear 2 (two) times daily. X 2 more days     docusate sodium 100 MG capsule  Commonly known as:  COLACE  Take 1 capsule (100 mg total) by mouth 2 (two) times daily as  needed for mild constipation (you can take it twice a day if no BM in 24hours).     HYDROcodone-acetaminophen 5-325 MG per tablet  Commonly known as:  NORCO/VICODIN  Take 1 tablet by mouth every 6 (six) hours as needed for moderate pain or severe pain.     pantoprazole 40 MG tablet  Commonly known as:  PROTONIX  Take 1 tablet (40 mg total) by mouth daily.     polyethylene glycol packet  Commonly known as:  MIRALAX / GLYCOLAX  Take 17 g by mouth daily.     witch hazel-glycerin pad  Commonly known as:  TUCKS  Apply topically 2 (two) times daily.         Brief H and P: For complete details please refer to admission H and P, but in brief Patient is a 81 year old female with hypertension, osteoarthritis, hyperlipidemia, recent UTI, chronic back pain presented to ED with worsening back pain, right-sided abdominal pain, constipation over the last 1 week. History was obtained from the patient and her son at the bedside. Patient lives with her son. Per patient's son, her back pain started around August 12th in the lower back, worse with movement. Patient's son also reported that it all started with significant constipation around the same time. She had no bowel movement for about a week. Patient presented to the ED on  8/16. She was found to have Escherichia coli UTI and was placed on Keflex. For the constipation, patient was given a prescription for MiraLAX and recommended to follow up with PCP. MRI of the thoracic and lumbar spine did show acute T11 compression fracture. Patient was discharged home however she continued to have back pain. Patient's son reported that finally with the MiraLAX, patient did have bowel movements. Patient describes the back pain as 8/10 today, sharp and worse with movement, in the lower back. She also endorsed having right-sided abdominal pain, no nausea or vomiting or any fevers or chills. Patient's son also reported that she had one dose of Zanaflex muscle relaxant  prescribed by her PCP, after which she had a severe reaction and was unresponsive.   Hospital Course:   Severe constipation/obstipation with abdominal pain/adynamic ileus : With cecal distention Patient was admitted with constipation however had gone through bowel cleansing prior to admission and was having bowel movements. However she was quite debilitated with the back pain and having significant constipation, abdominal pain issues in the week prior to admission. Patient was admitted for further workup.  CT abdomen showed cholelithiasis with gaseous distention of the cecum up to 10.2 cm without wall thickening or obstruction. Abdominal x-ray showed moderate residual stool in in the right colon with mild cecal dilatation of 7.2 cm. Gastroenterology was consulted. Patient was placed on clear liquid diet and serial abdominal x-rays were obtained.  Patient is now having liquid stools, is tolerating solid diet without any difficulty, has been having more activity with sitting up in the chair and walking in the hallway and working with physical therapy. It is somewhat difficult situation given her back pain and the need for the pain medications which causes constipation/ileus. She was placed on Colace and MiraLAX, adjust according to her bowel habits. Patient will follow with gastroenterology outpatient as needed.   Acute lower Back pain, Acute 11 compression fracture - MRI of the thoracic and lumbar spine showed acute compression fracture involving the T11 mid and lower vertebral body with 30% central height loss, disc bulge at T11 and 12, no cord compression or severe canal stenosis in the thoracic or lumbar spine.Patient was placed on pain control and bowel regimen. Interventional radiology was consulted and patient was scheduled to have a T11 kyphoplasty on 8/26. However patient and son declined any further workup for the back pain or kyphoplasty procedure, requested that they will follow with her  orthopedic physician after she is discharged from the hospital.  PT evaluation was obtained and recommended no PT follow-up, she is currently ambulatory. DME wheelchair and rolling walker was arranged.  Recent UTI  - Urine culture on 8/16 showed Escherichia coli, was treated with Keflex. Repeat UA on 8/23 today showed no UTI.   Left ear pain- resolved Ear was examined with otoscope, no significant infection or discharge, placed on ciprodex otic soln  Day of Discharge BP 152/77 mmHg  Pulse 95  Temp(Src) 97.2 F (36.2 C) (Oral)  Resp 18  Ht 5\' 6"  (1.676 m)  Wt 94.348 kg (208 lb)  BMI 33.59 kg/m2  SpO2 100%  Physical Exam: General: Alert and awake oriented x3 not in any acute distress. HEENT: anicteric sclera, pupils reactive to light and accommodation CVS: S1-S2 clear no murmur rubs or gallops Chest: clear to auscultation bilaterally, no wheezing rales or rhonchi Abdomen: soft nontender, nondistended, normal bowel sounds Extremities: no cyanosis, clubbing or edema noted bilaterally Neuro: Cranial nerves II-XII intact, no focal neurological deficits  The results of significant diagnostics from this hospitalization (including imaging, microbiology, ancillary and laboratory) are listed below for reference.    LAB RESULTS: Basic Metabolic Panel:  Recent Labs Lab 09/30/14 0437 10/01/14 0408  NA 135 134*  K 3.9 3.9  CL 104 105  CO2 23 24  GLUCOSE 111* 100*  BUN 7 8  CREATININE 1.07* 0.93  CALCIUM 9.0 8.8*  MG  --  1.9   Liver Function Tests:  Recent Labs Lab 09/27/14 1200  AST 23  ALT 18  ALKPHOS 74  BILITOT 0.6  PROT 7.1  ALBUMIN 3.8   No results for input(s): LIPASE, AMYLASE in the last 168 hours. No results for input(s): AMMONIA in the last 168 hours. CBC:  Recent Labs Lab 09/27/14 1200  09/30/14 0437 10/01/14 0408  WBC 5.0  < > 6.0 5.2  NEUTROABS 3.4  --   --   --   HGB 13.7  < > 13.2 12.4  HCT 40.6  < > 40.1 37.9  MCV 89.8  < > 91.8 91.8  PLT  262  < > 260 254  < > = values in this interval not displayed. Cardiac Enzymes: No results for input(s): CKTOTAL, CKMB, CKMBINDEX, TROPONINI in the last 168 hours. BNP: Invalid input(s): POCBNP CBG: No results for input(s): GLUCAP in the last 168 hours.  Significant Diagnostic Studies:  Dg Thoracic Spine 2 View  09/27/2014   CLINICAL DATA:  Upper thoracic pain for 2 weeks. Personal history of breast cancer.  EXAM: THORACIC SPINE 2 VIEWS  COMPARISON:  09/20/2014 MR. 06/18/2009 and prior chest radiographs  FINDINGS: A T11 compression fracture appears unchanged from 09/20/2014 MR with approximately 25% height loss.  No other thoracic spine fracture identified.  There is no evidence of subluxation or definite focal bony lesion.  Thoracic aortic atherosclerotic calcifications are noted.  IMPRESSION: Unchanged 25% T11 compression fracture since 09/20/2014.  No new abnormalities identified.   Electronically Signed   By: Margarette Canada M.D.   On: 09/27/2014 13:15   Ct Abdomen Pelvis W Contrast  09/27/2014   CLINICAL DATA:  Worsening back pain and RIGHT-sided abdominal pain, constipation over 1 week, having constipation, history hypertension, hyperlipidemia, recent UTI  EXAM: CT ABDOMEN AND PELVIS WITH CONTRAST  TECHNIQUE: Multidetector CT imaging of the abdomen and pelvis was performed using the standard protocol following bolus administration of intravenous contrast. Sagittal and coronal MPR images reconstructed from axial data set.  CONTRAST:  148mL OMNIPAQUE IOHEXOL 300 MG/ML SOLN IV. Dilute oral contrast.  COMPARISON:  None ; correlation CT chest 06/18/2009  FINDINGS: 8 mm nodule base of lingula image 1 unchanged since 2011.  Probable minimal atelectasis at posterior medial LEFT lower lobe image 1.  Dependent gallstones in gallbladder.  Tiny BILATERAL renal cysts.  Liver, spleen, pancreas, kidneys, and adrenal glands otherwise normal appearance.  Scattered atherosclerotic calcifications.  Appendix not  visualized.  Distended cecum 10.2 cm diameter.  Few uncomplicated sigmoid diverticula.  Stomach and bowel loops otherwise normal appearance.  Atrophic uterus and ovaries.  Small amount of free intraperitoneal fluid in pelvis and at base of RIGHT pericolic gutter.  No mass, adenopathy, free air or hernia.  Diffuse osseous demineralization with orthopedic hardware proximal LEFT femur.  IMPRESSION: Cholelithiasis.  Gaseous distention of cecum up to 10.2 cm transverse without definite wall thickening or evidence of obstruction.  Tiny BILATERAL renal cysts.  Small amount of nonspecific free intraperitoneal fluid.   Electronically Signed   By: Crist Infante.D.  On: 09/27/2014 18:20   Dg Abd 2 Views  09/28/2014   CLINICAL DATA:  Generalized abdominal pain secondary to constipation.  EXAM: ABDOMEN - 2 VIEW  COMPARISON:  CT scan of September 27, 2014.  FINDINGS: No small bowel dilatation is noted. Air-filled colon is noted. Cecum measures 7.2 cm in maximum measured diameter. There is no evidence of free air. Residual contrast and stool is noted in the right colon. No radio-opaque calculi or other significant radiographic abnormality is seen.  IMPRESSION: Moderate residual stool seen in right colon suggesting constipation. Mild cecal dilatation is noted.   Electronically Signed   By: Marijo Conception, M.D.   On: 09/28/2014 11:48    2D ECHO:   Disposition and Follow-up: Discharge Instructions    Diet - low sodium heart healthy    Complete by:  As directed      Discharge instructions    Complete by:  As directed   Please take miralax daily as needed for constipation. If you are taking pain medicines for back pain, then continue Colace twice a day to avoid getting constipated.  Stay hydrated, fiber diet and drink prune juice.     Increase activity slowly    Complete by:  As directed             DISPOSITION:Home with home health  DISCHARGE FOLLOW-UP Follow-up Information    Follow up with LONG,ASHLEY B,  PA-C. Schedule an appointment as soon as possible for a visit in 10 days.   Specialty:  Physician Assistant   Why:  for hospital follow-up   Contact information:   [address] [place] [address] [phone]       Follow up with [facility].   Why:  home health   Contact information:   [address] [place] [address] [phone]       Follow up with Scarlette Shorts, MD. Schedule an appointment as soon as possible for a visit in 2 weeks.   Specialty:  Gastroenterology   Why:  for hospital follow-up   Contact information:   [address] [address] [phone]        Time spent on Discharge: 35 mins   Signed:   Tremar Wickens M.D. [facility] 10/01/2014, 11:56 AM Pager: [phone]   Addendum: Coding query response  Moderate manutrition: in context of acute illness.  Nutritionist was consulted and started on nutrition supplements   Jesselee Poth M.D. [facility] 10/15/2014, 4:02 PM  Pager: [phone]

## 2014-10-01 NOTE — Care Management Note (Signed)
Case Management Note  Patient Details  Name: CACIE GASKINS MRN: [id number] Date of Birth: 1924-02-23  Subjective/Objective:                    Action/Plan: Home with home health  Expected Discharge Date:   (unknown)               Expected Discharge Plan:  [facility]  In-House Referral:     Discharge planning Services  CM Consult  Post Acute Care Choice:    Choice offered to:  Patient, Adult Children  DME Arranged:    DME Agency:     HH Arranged:  PT, OT HH Agency:  [facility]  Status of Service:  Completed, signed off  Medicare Important Message Given:  Yes-second notification given Date Medicare IM Given:    Medicare IM give by:    Date Additional Medicare IM Given:    Additional Medicare Important Message give by:     If discussed at [place] of Stay Meetings, dates discussed:    Additional Comments:  CM spoke with patient and family at the beside. Patient does not feel that she will need a wheelchair at home. Merry Proud at [facility] notified of the RW request.  Apolonio Schneiders, RN 10/01/2014, 11:28 AM

## 2014-10-01 NOTE — Progress Notes (Signed)
Patient discharged home, all discharge medication and instructions reviewed with patient and son and questions answered.  Patient to be assisted to vehicle by wheelchair.

## 2014-10-14 ENCOUNTER — Ambulatory Visit (INDEPENDENT_AMBULATORY_CARE_PROVIDER_SITE_OTHER): Payer: Medicare Other | Admitting: Nurse Practitioner

## 2014-10-14 ENCOUNTER — Encounter: Payer: Self-pay | Admitting: Nurse Practitioner

## 2014-10-14 ENCOUNTER — Ambulatory Visit (INDEPENDENT_AMBULATORY_CARE_PROVIDER_SITE_OTHER)
Admission: RE | Admit: 2014-10-14 | Discharge: 2014-10-14 | Disposition: A | Discharge status: Home / Self Care | Payer: Medicare Other | Source: Ambulatory Visit | Attending: Nurse Practitioner | Admitting: Nurse Practitioner

## 2014-10-14 VITALS — BP 140/70 | HR 84 | Ht 66.0 in | Wt 198.6 lb

## 2014-10-14 DIAGNOSIS — R933 Abnormal findings on diagnostic imaging of other parts of digestive tract: Secondary | ICD-10-CM | POA: Diagnosis not present

## 2014-10-14 DIAGNOSIS — K5901 Slow transit constipation: Secondary | ICD-10-CM | POA: Diagnosis not present

## 2014-10-14 NOTE — Progress Notes (Signed)
History of Present Illness:   Patient is an 81 year old female who we recently met in the hospital (Dr. Henrene Pastor). Patient was hospitalized with back pain, UTI, and constipation. MRI did show an acute T11 compression fracture. CT scan revealed cecal distention to 10.2 cm. Patient was given enemas and magnesium citrate which was followed by multiple bowel movements. Follow-up x-ray suggested distal small bowel obstruction, we felt ileus was most likely. She is in today with her son - caretaker. Patient has stopped narcotics. She is able to manage back pain with  Tylenol. Bowels moving normally off narcotics now. Son concerned about the bowel dilation on imaging, he inquires about a colonoscopy or at least follow up xray. No nausea, appetite okay.   Current Medications, Allergies, Past Medical History, Past Surgical History, Family History and Social History were reviewed in [employer] record.  Ct Abdomen Pelvis W Contrast  09/27/2014   CLINICAL DATA:  Worsening back pain and RIGHT-sided abdominal pain, constipation over 1 week, having constipation, history hypertension, hyperlipidemia, recent UTI  EXAM: CT ABDOMEN AND PELVIS WITH CONTRAST  TECHNIQUE: Multidetector CT imaging of the abdomen and pelvis was performed using the standard protocol following bolus administration of intravenous contrast. Sagittal and coronal MPR images reconstructed from axial data set.  CONTRAST:  146m OMNIPAQUE IOHEXOL 300 MG/ML SOLN IV. Dilute oral contrast.  COMPARISON:  None ; correlation CT chest 06/18/2009  FINDINGS: 8 mm nodule base of lingula image 1 unchanged since 2011.  Probable minimal atelectasis at posterior medial LEFT lower lobe image 1.  Dependent gallstones in gallbladder.  Tiny BILATERAL renal cysts.  Liver, spleen, pancreas, kidneys, and adrenal glands otherwise normal appearance.  Scattered atherosclerotic calcifications.  Appendix not visualized.  Distended cecum 10.2 cm diameter.   Few uncomplicated sigmoid diverticula.  Stomach and bowel loops otherwise normal appearance.  Atrophic uterus and ovaries.  Small amount of free intraperitoneal fluid in pelvis and at base of RIGHT pericolic gutter.  No mass, adenopathy, free air or hernia.  Diffuse osseous demineralization with orthopedic hardware proximal LEFT femur.  IMPRESSION: Cholelithiasis.  Gaseous distention of cecum up to 10.2 cm transverse without definite wall thickening or evidence of obstruction.  Tiny BILATERAL renal cysts.  Small amount of nonspecific free intraperitoneal fluid.   Electronically Signed   By: MLavonia DanaM.D.   On: 09/27/2014 18:20    Physical Exam: General: Pleasant, well developed , whitefemale in no acute distress Head: Normocephalic and atraumatic Eyes:  sclerae anicteric, conjunctiva pink  Ears: Normal auditory acuity Lungs: Clear throughout to auscultation Heart: Regular rate and rhythm Abdomen: LImited exam. Patient feels unsafe to get on exam table. Abdomen is soft,  non-tender. Normal bowel sounds Musculoskeletal: Symmetrical with no gross deformities  Extremities: No edema  Neurological: Alert oriented x 4, grossly nonfocal Psychological:  Alert and cooperative. Normal mood and affect  Assessment and Recommendations:  1. Pleasant 81year old female recently hospitalized with severe constipation. Abdominal films suggested ileus vrs SBO, we favored the former.  Son inquires about colonoscopy or some sort of further testing. No intestinal masses on CTscan which is reassuring. It is reasonable to repeat plain films to make sure bowel distention has resolved. Short of that would not recommend pursuing invasive workup at this time especially since bowels moving normally off narcotics now. She has no nausea or abdominal pain. Will call patient with xray results.   2. Thoracic compression fracture. Patient to see ortho soon

## 2014-10-14 NOTE — Patient Instructions (Signed)
Please go to the basement level to our X-ray department. We will call you with the results. 

## 2014-10-17 DIAGNOSIS — R933 Abnormal findings on diagnostic imaging of other parts of digestive tract: Secondary | ICD-10-CM | POA: Insufficient documentation

## 2014-10-17 NOTE — Progress Notes (Signed)
Agree. One to avoid invasive procedure unless overwhelming indication. Clinically she is doing well. The basic imaging. If abnormality or symptoms, could consider more advanced imaging.

## 2015-01-25 ENCOUNTER — Encounter (INDEPENDENT_AMBULATORY_CARE_PROVIDER_SITE_OTHER): Payer: Medicare Other | Admitting: Ophthalmology

## 2015-01-25 DIAGNOSIS — H35033 Hypertensive retinopathy, bilateral: Secondary | ICD-10-CM

## 2015-01-25 DIAGNOSIS — I10 Essential (primary) hypertension: Secondary | ICD-10-CM | POA: Diagnosis not present

## 2015-01-25 DIAGNOSIS — H353112 Nonexudative age-related macular degeneration, right eye, intermediate dry stage: Secondary | ICD-10-CM | POA: Diagnosis not present

## 2015-01-25 DIAGNOSIS — H43813 Vitreous degeneration, bilateral: Secondary | ICD-10-CM | POA: Diagnosis not present

## 2015-01-25 DIAGNOSIS — H353221 Exudative age-related macular degeneration, left eye, with active choroidal neovascularization: Secondary | ICD-10-CM

## 2016-02-08 ENCOUNTER — Telehealth: Payer: Self-pay

## 2018-09-17 ENCOUNTER — Emergency Department (HOSPITAL_COMMUNITY): Payer: Medicare Other

## 2018-09-17 ENCOUNTER — Inpatient Hospital Stay (HOSPITAL_COMMUNITY): Payer: Medicare Other

## 2018-09-17 ENCOUNTER — Encounter (HOSPITAL_COMMUNITY): Payer: Self-pay | Admitting: Emergency Medicine

## 2018-09-17 ENCOUNTER — Other Ambulatory Visit: Payer: Self-pay

## 2018-09-17 ENCOUNTER — Inpatient Hospital Stay (HOSPITAL_COMMUNITY)
Admission: EM | Admit: 2018-09-17 | Discharge: 2018-11-05 | DRG: 308 | Disposition: E | Payer: Medicare Other | Attending: Student in an Organized Health Care Education/Training Program | Admitting: Student in an Organized Health Care Education/Training Program

## 2018-09-17 DIAGNOSIS — I4892 Unspecified atrial flutter: Secondary | ICD-10-CM | POA: Diagnosis present

## 2018-09-17 DIAGNOSIS — I10 Essential (primary) hypertension: Secondary | ICD-10-CM | POA: Diagnosis not present

## 2018-09-17 DIAGNOSIS — R142 Eructation: Secondary | ICD-10-CM | POA: Diagnosis not present

## 2018-09-17 DIAGNOSIS — I5033 Acute on chronic diastolic (congestive) heart failure: Secondary | ICD-10-CM | POA: Diagnosis not present

## 2018-09-17 DIAGNOSIS — E222 Syndrome of inappropriate secretion of antidiuretic hormone: Secondary | ICD-10-CM | POA: Diagnosis present

## 2018-09-17 DIAGNOSIS — R0902 Hypoxemia: Secondary | ICD-10-CM

## 2018-09-17 DIAGNOSIS — J90 Pleural effusion, not elsewhere classified: Secondary | ICD-10-CM | POA: Diagnosis present

## 2018-09-17 DIAGNOSIS — F05 Delirium due to known physiological condition: Secondary | ICD-10-CM | POA: Diagnosis present

## 2018-09-17 DIAGNOSIS — R0602 Shortness of breath: Secondary | ICD-10-CM

## 2018-09-17 DIAGNOSIS — I4819 Other persistent atrial fibrillation: Principal | ICD-10-CM | POA: Diagnosis present

## 2018-09-17 DIAGNOSIS — I350 Nonrheumatic aortic (valve) stenosis: Secondary | ICD-10-CM

## 2018-09-17 DIAGNOSIS — X58XXXA Exposure to other specified factors, initial encounter: Secondary | ICD-10-CM | POA: Diagnosis not present

## 2018-09-17 DIAGNOSIS — J9601 Acute respiratory failure with hypoxia: Secondary | ICD-10-CM | POA: Diagnosis present

## 2018-09-17 DIAGNOSIS — T17990A Other foreign object in respiratory tract, part unspecified in causing asphyxiation, initial encounter: Secondary | ICD-10-CM | POA: Diagnosis not present

## 2018-09-17 DIAGNOSIS — Z79899 Other long term (current) drug therapy: Secondary | ICD-10-CM | POA: Diagnosis not present

## 2018-09-17 DIAGNOSIS — R109 Unspecified abdominal pain: Secondary | ICD-10-CM | POA: Diagnosis not present

## 2018-09-17 DIAGNOSIS — E785 Hyperlipidemia, unspecified: Secondary | ICD-10-CM | POA: Diagnosis not present

## 2018-09-17 DIAGNOSIS — Z66 Do not resuscitate: Secondary | ICD-10-CM | POA: Diagnosis not present

## 2018-09-17 DIAGNOSIS — J181 Lobar pneumonia, unspecified organism: Secondary | ICD-10-CM | POA: Diagnosis not present

## 2018-09-17 DIAGNOSIS — L821 Other seborrheic keratosis: Secondary | ICD-10-CM | POA: Diagnosis present

## 2018-09-17 DIAGNOSIS — K567 Ileus, unspecified: Secondary | ICD-10-CM | POA: Diagnosis not present

## 2018-09-17 DIAGNOSIS — E8770 Fluid overload, unspecified: Secondary | ICD-10-CM | POA: Diagnosis not present

## 2018-09-17 DIAGNOSIS — I4891 Unspecified atrial fibrillation: Secondary | ICD-10-CM

## 2018-09-17 DIAGNOSIS — E876 Hypokalemia: Secondary | ICD-10-CM | POA: Diagnosis present

## 2018-09-17 DIAGNOSIS — R011 Cardiac murmur, unspecified: Secondary | ICD-10-CM | POA: Diagnosis not present

## 2018-09-17 DIAGNOSIS — R0603 Acute respiratory distress: Secondary | ICD-10-CM

## 2018-09-17 DIAGNOSIS — E861 Hypovolemia: Secondary | ICD-10-CM | POA: Diagnosis present

## 2018-09-17 DIAGNOSIS — Z8701 Personal history of pneumonia (recurrent): Secondary | ICD-10-CM | POA: Diagnosis not present

## 2018-09-17 DIAGNOSIS — E43 Unspecified severe protein-calorie malnutrition: Secondary | ICD-10-CM | POA: Diagnosis not present

## 2018-09-17 DIAGNOSIS — I110 Hypertensive heart disease with heart failure: Secondary | ICD-10-CM | POA: Diagnosis present

## 2018-09-17 DIAGNOSIS — Z933 Colostomy status: Secondary | ICD-10-CM | POA: Diagnosis not present

## 2018-09-17 DIAGNOSIS — Z6832 Body mass index (BMI) 32.0-32.9, adult: Secondary | ICD-10-CM | POA: Diagnosis not present

## 2018-09-17 DIAGNOSIS — Z8719 Personal history of other diseases of the digestive system: Secondary | ICD-10-CM | POA: Diagnosis not present

## 2018-09-17 DIAGNOSIS — M25511 Pain in right shoulder: Secondary | ICD-10-CM | POA: Diagnosis not present

## 2018-09-17 DIAGNOSIS — F419 Anxiety disorder, unspecified: Secondary | ICD-10-CM | POA: Diagnosis present

## 2018-09-17 DIAGNOSIS — J9691 Respiratory failure, unspecified with hypoxia: Secondary | ICD-10-CM | POA: Diagnosis not present

## 2018-09-17 DIAGNOSIS — R1013 Epigastric pain: Secondary | ICD-10-CM | POA: Diagnosis not present

## 2018-09-17 DIAGNOSIS — Z96653 Presence of artificial knee joint, bilateral: Secondary | ICD-10-CM | POA: Diagnosis present

## 2018-09-17 DIAGNOSIS — E8809 Other disorders of plasma-protein metabolism, not elsewhere classified: Secondary | ICD-10-CM | POA: Diagnosis present

## 2018-09-17 DIAGNOSIS — R140 Abdominal distension (gaseous): Secondary | ICD-10-CM

## 2018-09-17 DIAGNOSIS — J9600 Acute respiratory failure, unspecified whether with hypoxia or hypercapnia: Secondary | ICD-10-CM | POA: Diagnosis not present

## 2018-09-17 DIAGNOSIS — I5032 Chronic diastolic (congestive) heart failure: Secondary | ICD-10-CM | POA: Diagnosis not present

## 2018-09-17 DIAGNOSIS — J918 Pleural effusion in other conditions classified elsewhere: Secondary | ICD-10-CM | POA: Diagnosis not present

## 2018-09-17 DIAGNOSIS — Z978 Presence of other specified devices: Secondary | ICD-10-CM | POA: Diagnosis not present

## 2018-09-17 DIAGNOSIS — J189 Pneumonia, unspecified organism: Secondary | ICD-10-CM

## 2018-09-17 DIAGNOSIS — R935 Abnormal findings on diagnostic imaging of other abdominal regions, including retroperitoneum: Secondary | ICD-10-CM | POA: Diagnosis not present

## 2018-09-17 DIAGNOSIS — Z20828 Contact with and (suspected) exposure to other viral communicable diseases: Secondary | ICD-10-CM | POA: Diagnosis not present

## 2018-09-17 DIAGNOSIS — J690 Pneumonitis due to inhalation of food and vomit: Secondary | ICD-10-CM | POA: Diagnosis present

## 2018-09-17 DIAGNOSIS — E871 Hypo-osmolality and hyponatremia: Secondary | ICD-10-CM | POA: Diagnosis not present

## 2018-09-17 DIAGNOSIS — Z87891 Personal history of nicotine dependence: Secondary | ICD-10-CM | POA: Diagnosis not present

## 2018-09-17 DIAGNOSIS — E46 Unspecified protein-calorie malnutrition: Secondary | ICD-10-CM | POA: Diagnosis not present

## 2018-09-17 DIAGNOSIS — Z09 Encounter for follow-up examination after completed treatment for conditions other than malignant neoplasm: Secondary | ICD-10-CM

## 2018-09-17 DIAGNOSIS — R0600 Dyspnea, unspecified: Secondary | ICD-10-CM

## 2018-09-17 DIAGNOSIS — Z452 Encounter for adjustment and management of vascular access device: Secondary | ICD-10-CM

## 2018-09-17 LAB — COMPREHENSIVE METABOLIC PANEL
ALT: 28 U/L (ref 0–44)
AST: 28 U/L (ref 15–41)
Albumin: 3.5 g/dL (ref 3.5–5.0)
Alkaline Phosphatase: 53 U/L (ref 38–126)
Anion gap: 13 (ref 5–15)
BUN: 7 mg/dL — ABNORMAL LOW (ref 8–23)
CO2: 21 mmol/L — ABNORMAL LOW (ref 22–32)
Calcium: 9 mg/dL (ref 8.9–10.3)
Chloride: 102 mmol/L (ref 98–111)
Creatinine, Ser: 1.22 mg/dL — ABNORMAL HIGH (ref 0.44–1.00)
GFR calc Af Amer: 45 mL/min — ABNORMAL LOW (ref >60)
GFR calc non Af Amer: 39 mL/min — ABNORMAL LOW (ref >60)
Glucose, Bld: 131 mg/dL — ABNORMAL HIGH (ref 70–99)
Potassium: 4.3 mmol/L (ref 3.5–5.1)
Sodium: 136 mmol/L (ref 135–145)
Total Bilirubin: 0.8 mg/dL (ref 0.3–1.2)
Total Protein: 6.7 g/dL (ref 6.5–8.1)

## 2018-09-17 LAB — APTT: aPTT: 32 seconds (ref 24–36)

## 2018-09-17 LAB — I-STAT CHEM 8, ED
BUN: 8 mg/dL (ref 8–23)
Calcium, Ion: 1.09 mmol/L — ABNORMAL LOW (ref 1.15–1.40)
Chloride: 103 mmol/L (ref 98–111)
Creatinine, Ser: 1.1 mg/dL — ABNORMAL HIGH (ref 0.44–1.00)
Glucose, Bld: 128 mg/dL — ABNORMAL HIGH (ref 70–99)
HCT: 43 % (ref 36.0–46.0)
Hemoglobin: 14.6 g/dL (ref 12.0–15.0)
Potassium: 4.1 mmol/L (ref 3.5–5.1)
Sodium: 134 mmol/L — ABNORMAL LOW (ref 135–145)
TCO2: 23 mmol/L (ref 22–32)

## 2018-09-17 LAB — CBC WITH DIFFERENTIAL/PLATELET
Abs Immature Granulocytes: 0.02 10*3/uL (ref 0.00–0.07)
Basophils Absolute: 0.1 10*3/uL (ref 0.0–0.1)
Basophils Relative: 1 %
Eosinophils Absolute: 0.1 10*3/uL (ref 0.0–0.5)
Eosinophils Relative: 1 %
HCT: 42.5 % (ref 36.0–46.0)
Hemoglobin: 13.6 g/dL (ref 12.0–15.0)
Immature Granulocytes: 0 %
Lymphocytes Relative: 17 %
Lymphs Abs: 1.5 10*3/uL (ref 0.7–4.0)
MCH: 30.2 pg (ref 26.0–34.0)
MCHC: 32 g/dL (ref 30.0–36.0)
MCV: 94.2 fL (ref 80.0–100.0)
Monocytes Absolute: 0.9 10*3/uL (ref 0.1–1.0)
Monocytes Relative: 10 %
Neutro Abs: 6.3 10*3/uL (ref 1.7–7.7)
Neutrophils Relative %: 71 %
Platelets: 259 10*3/uL (ref 150–400)
RBC: 4.51 MIL/uL (ref 3.87–5.11)
RDW: 12.9 % (ref 11.5–15.5)
WBC: 8.8 10*3/uL (ref 4.0–10.5)
nRBC: 0 % (ref 0.0–0.2)

## 2018-09-17 LAB — PROTIME-INR
INR: 1.1 (ref 0.8–1.2)
Prothrombin Time: 13.9 seconds (ref 11.4–15.2)

## 2018-09-17 LAB — TSH: TSH: 1.658 u[IU]/mL (ref 0.350–4.500)

## 2018-09-17 LAB — TROPONIN I (HIGH SENSITIVITY)
Troponin I (High Sensitivity): 45 ng/L — ABNORMAL HIGH (ref <18)
Troponin I (High Sensitivity): 48 ng/L — ABNORMAL HIGH (ref <18)

## 2018-09-17 LAB — BRAIN NATRIURETIC PEPTIDE: B Natriuretic Peptide: 418.6 pg/mL — ABNORMAL HIGH (ref 0.0–100.0)

## 2018-09-17 LAB — CBG MONITORING, ED: Glucose-Capillary: 123 mg/dL — ABNORMAL HIGH (ref 70–99)

## 2018-09-17 LAB — SARS CORONAVIRUS 2 (TAT 6-24 HRS): SARS Coronavirus 2: NEGATIVE

## 2018-09-17 MED ORDER — HEPARIN (PORCINE) 25000 UT/250ML-% IV SOLN
1150.0000 [IU]/h | INTRAVENOUS | Status: DC
  Administered 2018-09-17: 22:00:00 1150 [IU]/h via INTRAVENOUS
  Administered 2018-09-18 – 2018-09-19 (×2): 950 [IU]/h via INTRAVENOUS
  Administered 2018-09-21 (×2): 1150 [IU]/h via INTRAVENOUS
  Filled 2018-09-17 (×5): qty 250

## 2018-09-17 MED ORDER — SODIUM CHLORIDE 0.9 % IV BOLUS
500.0000 mL | Freq: Once | INTRAVENOUS | Status: AC
  Administered 2018-09-17: 10:00:00 500 mL via INTRAVENOUS

## 2018-09-17 MED ORDER — ALBUTEROL SULFATE HFA 108 (90 BASE) MCG/ACT IN AERS
2.0000 | INHALATION_SPRAY | Freq: Once | RESPIRATORY_TRACT | Status: AC
  Administered 2018-09-17: 10:00:00 2 via RESPIRATORY_TRACT
  Filled 2018-09-17: qty 6.7

## 2018-09-17 MED ORDER — ONDANSETRON HCL 4 MG/2ML IJ SOLN
4.0000 mg | Freq: Four times a day (QID) | INTRAMUSCULAR | Status: DC | PRN
  Administered 2018-09-17 – 2018-09-19 (×3): 4 mg via INTRAVENOUS
  Filled 2018-09-17 (×3): qty 2

## 2018-09-17 MED ORDER — SODIUM CHLORIDE 0.9% FLUSH: 10.0000 mL | INTRAVENOUS | Status: DC | PRN

## 2018-09-17 MED ORDER — SODIUM CHLORIDE 0.9% FLUSH
3.0000 mL | Freq: Two times a day (BID) | INTRAVENOUS | Status: DC
  Administered 2018-09-17 – 2018-10-11 (×27): 3 mL via INTRAVENOUS

## 2018-09-17 MED ORDER — ACETAMINOPHEN 650 MG RE SUPP
650.0000 mg | Freq: Four times a day (QID) | RECTAL | Status: DC | PRN
  Administered 2018-09-20: 23:00:00 650 mg via RECTAL
  Filled 2018-09-17: qty 1

## 2018-09-17 MED ORDER — DILTIAZEM LOAD VIA INFUSION
10.0000 mg | Freq: Once | INTRAVENOUS | Status: AC
  Administered 2018-09-17: 10 mg via INTRAVENOUS
  Filled 2018-09-17: qty 10

## 2018-09-17 MED ORDER — FUROSEMIDE 10 MG/ML IJ SOLN
20.0000 mg | Freq: Once | INTRAMUSCULAR | Status: AC
  Administered 2018-09-17: 13:00:00 20 mg via INTRAVENOUS
  Filled 2018-09-17: qty 2

## 2018-09-17 MED ORDER — ACETAMINOPHEN 325 MG PO TABS
650.0000 mg | ORAL_TABLET | Freq: Four times a day (QID) | ORAL | Status: DC | PRN
  Administered 2018-09-17 – 2018-10-11 (×13): 650 mg via ORAL
  Filled 2018-09-17 (×13): qty 2

## 2018-09-17 MED ORDER — HEPARIN BOLUS VIA INFUSION
4000.0000 [IU] | Freq: Once | INTRAVENOUS | Status: AC
  Administered 2018-09-17: 22:00:00 4000 [IU] via INTRAVENOUS
  Filled 2018-09-17: qty 4000

## 2018-09-17 MED ORDER — POLYETHYLENE GLYCOL 3350 17 G PO PACK
17.0000 g | PACK | Freq: Two times a day (BID) | ORAL | Status: DC
  Administered 2018-09-17 – 2018-10-07 (×23): 17 g via ORAL
  Filled 2018-09-17 (×31): qty 1

## 2018-09-17 MED ORDER — DILTIAZEM HCL-DEXTROSE 100-5 MG/100ML-% IV SOLN (PREMIX)
5.0000 mg/h | INTRAVENOUS | Status: DC
  Administered 2018-09-17: 16:00:00 15 mg/h via INTRAVENOUS
  Administered 2018-09-17: 5 mg/h via INTRAVENOUS
  Administered 2018-09-17 – 2018-09-22 (×15): 15 mg/h via INTRAVENOUS
  Filled 2018-09-17 (×25): qty 100

## 2018-09-17 MED ORDER — DILTIAZEM HCL 25 MG/5ML IV SOLN
10.0000 mg | Freq: Once | INTRAVENOUS | Status: AC
  Administered 2018-09-17: 10 mg via INTRAVENOUS
  Filled 2018-09-17: qty 5

## 2018-09-17 MED ORDER — AMLODIPINE BESYLATE 5 MG PO TABS
5.0000 mg | ORAL_TABLET | Freq: Every day | ORAL | Status: DC
  Administered 2018-09-18 – 2018-09-21 (×3): 5 mg via ORAL
  Filled 2018-09-17 (×4): qty 1

## 2018-09-17 MED ORDER — HEPARIN SODIUM (PORCINE) 5000 UNIT/ML IJ SOLN
5000.0000 [IU] | Freq: Three times a day (TID) | INTRAMUSCULAR | Status: DC
  Administered 2018-09-17: 17:00:00 5000 [IU] via SUBCUTANEOUS
  Filled 2018-09-17: qty 1

## 2018-09-17 MED ORDER — ONDANSETRON HCL 4 MG PO TABS: 4.0000 mg | ORAL_TABLET | Freq: Four times a day (QID) | ORAL | Status: DC | PRN

## 2018-09-17 MED ORDER — POLYETHYLENE GLYCOL 3350 17 G PO PACK: 17.0000 g | PACK | Freq: Every day | ORAL | Status: DC

## 2018-09-17 MED ORDER — POLYETHYLENE GLYCOL 3350 17 G PO PACK: 17.0000 g | PACK | Freq: Every day | ORAL | Status: DC | PRN

## 2018-09-17 NOTE — H&P (Addendum)
Date: 09/09/2018               Patient Name:  [name] MRN: [id number]  DOB: Dec 10, 1924 Age / Sex: 85 y.o., female   PCP: Jettie Booze, NP         Medical Service: Internal Medicine Teaching Service         Attending Physician: Dr. Lucious Groves, DO    First Contact: Dr. Charleen Kirks Pager: [phone]  Second Contact: Dr. Tarri Abernethy Pager: [phone]       After Hours (After 5p/  First Contact Pager: [phone]  weekends / holidays): Second Contact Pager: [phone]   Chief Complaint: Shortness of breathe    History of Present Illness:   [name] is a 85 y/o female with PMHx of HTN, ileus, low back pain, and pericarditis who presented following 3 days of DOE and 1 day of right shoulder pain.  [name] reports that she began to have shortness of breath about 3 days ago.  Her son lives with her and has some home health experience, so he checks her BP and heart rate regularly.  During a bout of shortness of breath, he checked her heart rate and it was in the 140s.  Shortness of breath was exacerbated by exertion, and she began to have to take breaks when walking from her bedroom to the kitchen. This morning at 7:00, [name] states that she was unable to sleep due to her usual urinary frequency.  She had gotten up to go to the bathroom and on her way back had sudden onset severe shortness of breath in addition to sternal chest pain and dizziness.  The chest pain resolved quickly however she began to have significant right shoulder pain with radiation to her back.  She was unable to relieve symptoms by laying down in bed.   [name] notes a complicated abdominal history that began back in 2016 with bowel ileus, at which time she was seen by Dr. Henrene Pastor.  Since then she has been experiencing intermittent bloating.  The bloating has gotten significantly worse in the past 1 month.  She endorses abdominal pain that is periumbilical does not radiate anywhere.  She denies nausea and vomiting,  hematochezia, melena.  She continues to have regular bowel movements at this time with occasional blood on the toilet paper but has been worked up for this in the past and findings were negative.  She has never had blood in the toilet bowl or in stool.  Patient also notes that she began to have bilateral arm tics approximately 1 year ago and was diagnosed with tardive dyskinesia although she is not on any of the medication that typically cause that.  She is not currently taking anything for it.   Patient endorses some headache with neck pain but denies fevers, chills, sick contacts, leg swelling.   [name] notes a history of leg swelling in the past for which she has Lasix PRN.  She uses it for 3 to 4 days at a time whenever her diet causes edema however she has not had to do this in a while.  ED course: Imaging showed cardiomegaly with bibasilar interstitial edema, in addition to an airspace consolidation in the medial left base and left pleural effusion.  Right shoulder x-ray negative for acute findings.  KUB showed nonspecific bowel gas pattern without obstruction.  Troponin was elevated at 45 and increased to 48 after 2 hours.  CMP was significant for  an elevated creatinine at 1.22.  BNP was elevated in the 400s.  CBC was unremarkable.  EKG showed new onset A. fib with RVR.  At this time patient was started on diltiazem drip.   Meds:  Current Meds  Medication Sig  . acetaminophen (TYLENOL) 500 MG tablet Take 500 mg by mouth every 4 (four) hours as needed for mild pain, moderate pain or headache (Max 4 doses in 24 hours).   Marland Kitchen amLODipine (NORVASC) 5 MG tablet Take 1 tablet (5 mg total) by mouth daily.  Marland Kitchen docusate sodium (COLACE) 100 MG capsule Take 1 capsule (100 mg total) by mouth 2 (two) times daily as needed for mild constipation (you can take it twice a day if no BM in 24hours).  . furosemide (LASIX) 20 MG tablet Take 20 mg by mouth as needed for fluid.   . metroNIDAZOLE (METROCREAM) 0.75 %  cream Apply 1 application topically as needed (rash on face).   . polyethylene glycol (MIRALAX / GLYCOLAX) packet Take 17 g by mouth daily. (Patient taking differently: Take 17 g by mouth daily as needed for mild constipation or moderate constipation. )   Allergies: Allergies as of 10/05/2018 - Review Complete 10/05/2018  Allergen Reaction Noted  . Diclofenac sodium Anaphylaxis 05/16/2011  . Ibuprofen Anaphylaxis 05/16/2011  . Lisinopril  05/16/2011  . Adhesive [tape]  05/16/2011  . Codeine Nausea Only 05/16/2011  . Tizanidine  10/14/2014   Past Medical History:  Diagnosis Date  . Anaphylactic shock 1990  . Arthritis    cervical, spurs  . Back pain 05/16/2011  . Bowel obstruction ([place])   . Broken leg 1996  . Cancer (HCC)    basal cells on back, shoulder and nose  . Chicken pox as a child  . DOE (dyspnea on exertion) 05/16/2011  . Fainting spell    X 1 time, at home in hospital 3 days  . History of shingles   . Hyperlipidemia 06/19/2011  . Hypertension   . Hyponatremia 11/12/2011  . Measles as a child  . Mumps as a child  . Neck pain, chronic   . Pericarditis   . Preventative health care 05/16/2011  . Psoriasis    scalp  . Sacroiliac dysfunction 11/12/2011  . Seborrhea capitis   . Seborrheic keratosis   . UTI (lower urinary tract infection) 06/23/2011    Family History:  Family History  Problem Relation Age of Onset  . Ovarian cancer Mother   . Pancreatic cancer Father   . Diabetes Son        type 2  . COPD Son        smoked  . Other Son        laryngeal dysplasia  . Emphysema Son   . Heart disease Son   . Colon cancer Neg Hx   . Colon polyps Neg Hx   . Esophageal cancer Neg Hx   . Kidney disease Neg Hx   . Liver disease Neg Hx     Social History:  Tobacco: Half a pack for 40 years = 20 pack years. Quit 20 years ago Alcohol: Socially only.  Drugs: Denies Widowed. Husband passed away ~30 years ago Lives at home with her youngest son, [name], who is her main  caretaker.   Review of Systems: A complete ROS was negative except as per HPI.   Physical Exam: Blood pressure (!) 157/88, pulse 76, temperature 98.6 F (37 C), temperature source Oral, resp. rate (!) 21, SpO2 100 %.  Physical  Exam Vitals signs and nursing note reviewed.  Constitutional:      Appearance: She is obese.  HENT:     Head: Normocephalic and atraumatic.  Cardiovascular:     Rate and Rhythm: Tachycardia present. Rhythm irregular.     Pulses: No decreased pulses.     Heart sounds: No murmur. No gallop.   Pulmonary:     Effort: Pulmonary effort is normal. No tachypnea, accessory muscle usage or respiratory distress.     Breath sounds: Examination of the right-lower field reveals rales. Examination of the left-lower field reveals rales. Rales present. No decreased breath sounds, wheezing or rhonchi.  Abdominal:     General: Bowel sounds are normal.     Palpations: Abdomen is soft.     Tenderness: There is abdominal tenderness (Epigastric). There is no guarding.  Musculoskeletal:     Right lower leg: No edema.     Left lower leg: No edema.  Skin:    General: Skin is warm and dry.  Neurological:     Mental Status: She is alert and oriented to person, place, and time.     Comments: Bilateral upper extremities demonstrated repetitive twitching and tremor.  Psychiatric:        Mood and Affect: Mood normal.        Behavior: Behavior normal.    EKG: personally reviewed my interpretation is: Rate 130. Irregularly irregular rhythm. Wide QRS. No P waves. No ST elevations. No T wave peaking.   CXR: personally reviewed my interpretation is: Left pleural effusion. Cardiomegaly, unable to visualize left cardiac silhouette.   Assessment & Plan by Problem: Active Problems:   Atrial fibrillation with RVR (HCC)  # Atrial Fibrillation with RVR Brandi. Mckell presented to the ED with shortness of breath and was found to be in A. fib with RVR.  She was started on a diltiazem drip and has  been responding well once the dose was titrated up to 15 mg/hr. Initial heart rate on arrival was approximately 140s to 150s, however now it is slowed down to the 100s.   Unknown if A. fib was was provoked. Patient has no known history of heart failure. A TTE has been ordered.  No known coronary artery disease, however significant atherosclerosis on past CTs.  She does have a history of pericarditis, however denies chest pain at this time.  Chest x-ray shows a possible pneumonia and patient has been having shortness of breath.  However, no febrile events and no leukocytosis.  May consider repeating chest x-ray to see if any changes.  TSH is normal.  Patient denies any recent alcohol use.   Chadsvas score of 6.  Plan to anticoagulate.   - Continue diltiazem drip - Telemetry  - TTE  - Heparin per pharmacy   # Abdominal pain # Hx of Ileus Brandi. Cota has a history of abdominal obstruction and ileus in 2016.  She has noticed an increased abdominal bloating and pain in the past 1 month.  Her bowel movements have been regular but runny secondary to stool softener use.  KUB was negative for obstruction.  We will continue to monitor   # Hypertension Only home medication for blood pressure is amlodipine 5 mg daily.  Usually well controlled but a repeat BP was 157/88. Plan to restart and monitor BP.    - Amlodipine 5mg  QD   Dispo: Admit patient to Inpatient with expected length of stay greater than 2 midnights.  Signed:  Dr. Jose Persia Internal Medicine PGY-1  Pager: [phone] 09/22/2018, 5:37 PM

## 2018-09-17 NOTE — ED Notes (Signed)
ED TO INPATIENT HANDOFF REPORT  ED Nurse Name and Phone #: Callie Fielding #[phone]  S Name/Age/Gender [name] 85 y.o. female Room/Bed: 012C/012C  Code Status   Code Status: Full Code  Home/SNF/Other Home Patient oriented to: self, place, time and situation Is this baseline? Yes   Triage Complete: Triage complete  Chief Complaint svt  Triage Note Pt brought in by EMS from home for c/o right shoulder pain/ neck pain and the SOB that began today ; pt has a chronic cough ; denies any fevers; upon ems arrival , patient was found to be in SVT with a rate of 150's and had a bp of 80/54; ems gave 500 ml bolus and  bp went up 150/100; pt denies any chest pain but states " I cant breath"    Allergies Allergies  Allergen Reactions  . Diclofenac Sodium Anaphylaxis  . Ibuprofen Anaphylaxis  . Lisinopril     Fall hazard, dizzy  . Adhesive [Tape]   . Codeine Nausea Only  . Tizanidine     "knocked out cold"    Level of Care/Admitting Diagnosis ED Disposition    ED Disposition Condition [facility]: [place] [[id number]]  Level of Care: Progressive [102]  Covid Evaluation: Asymptomatic Screening Protocol (No Symptoms)  Diagnosis: Atrial fibrillation with RVR ([place]) [226333]  Admitting Physician: Bosie Helper  Attending Physician: Lucious Groves [[id number]]  Estimated length of stay: past midnight tomorrow  Certification:: I certify this patient will need inpatient services for at least 2 midnights  PT Class (Do Not Modify): Inpatient [101]  PT Acc Code (Do Not Modify): Private [1]       B Medical/Surgery History Past Medical History:  Diagnosis Date  . Anaphylactic shock 1990  . Arthritis    cervical, spurs  . Back pain 05/16/2011  . Bowel obstruction   . Broken leg 1996  . Cancer    basal cells on back, shoulder and nose  . Chicken pox as a child  . DOE (dyspnea on exertion) 05/16/2011  . Fainting spell    X 1 time, at  home in hospital 3 days  . History of shingles   . Hyperlipidemia 06/19/2011  . Hypertension   . Hyponatremia 11/12/2011  . Measles as a child  . Mumps as a child  . Neck pain, chronic   . Pericarditis   . Preventative health care 05/16/2011  . Psoriasis    scalp  . Sacroiliac dysfunction 11/12/2011  . Seborrhea capitis   . Seborrheic keratosis   . UTI (lower urinary tract infection) 06/23/2011   Past Surgical History:  Procedure Laterality Date  . back disc repair  1979   low back with LLE radiculopathy  . biopsy left leg  10-23-11   benign  . BREAST LUMPECTOMY Left 1947  . CATARACT EXTRACTION, BILATERAL    . FEMUR SURGERY Left   . TOTAL KNEE ARTHROPLASTY Bilateral 1990  . TUBAL LIGATION       A IV Location/Drains/Wounds Patient Lines/Drains/Airways Status   Active Line/Drains/Airways    Name:   Placement date:   Placement time:   Site:   Days:   Peripheral IV 09/06/2018 Left Forearm   09/19/2018    -    Forearm   less than 1          Intake/Output Last 24 hours  Intake/Output Summary (Last 24 hours) at 09/26/2018 1430 Last data filed at 09/16/2018 1143 Gross per  24 hour  Intake 500 ml  Output -  Net 500 ml    Labs/Imaging Results for orders placed or performed during the hospital encounter of 09/26/2018 (from the past 48 hour(s))  Comprehensive metabolic panel     Status: Abnormal   Collection Time: 09/21/2018 10:15 AM  Result Value Ref Range   Sodium 136 135 - 145 mmol/L   Potassium 4.3 3.5 - 5.1 mmol/L   Chloride 102 98 - 111 mmol/L   CO2 21 (L) 22 - 32 mmol/L   Glucose, Bld 131 (H) 70 - 99 mg/dL   BUN 7 (L) 8 - 23 mg/dL   Creatinine, Ser 1.22 (H) 0.44 - 1.00 mg/dL   Calcium 9.0 8.9 - 10.3 mg/dL   Total Protein 6.7 6.5 - 8.1 g/dL   Albumin 3.5 3.5 - 5.0 g/dL   AST 28 15 - 41 U/L   ALT 28 0 - 44 U/L   Alkaline Phosphatase 53 38 - 126 U/L   Total Bilirubin 0.8 0.3 - 1.2 mg/dL   GFR calc non Af Amer 39 (L) >60 mL/min   GFR calc Af Amer 45 (L) >60 mL/min    Anion gap 13 5 - 15    Comment: Performed at [facility], [address], [place], [place] [address]  Troponin I (High Sensitivity)     Status: Abnormal   Collection Time: 10/02/2018 10:15 AM  Result Value Ref Range   Troponin I (High Sensitivity) 45 (H) <18 ng/L    Comment: (NOTE) Elevated high sensitivity troponin I (hsTnI) values and significant  changes across serial measurements may suggest ACS but many other  chronic and acute conditions are known to elevate hsTnI results.  Refer to the "Links" section for chest pain algorithms and additional  guidance. Performed at [facility], [place] [address] [place], [place] [address]   Brain natriuretic peptide     Status: Abnormal   Collection Time: 10/02/2018 10:15 AM  Result Value Ref Range   B Natriuretic Peptide 418.6 (H) 0.0 - 100.0 pg/mL    Comment: Performed at [facility] [address], [place], [place] [address]  CBC with Differential     Status: None   Collection Time: 09/05/2018 10:15 AM  Result Value Ref Range   WBC 8.8 4.0 - 10.5 K/uL   RBC 4.51 3.87 - 5.11 MIL/uL   Hemoglobin 13.6 12.0 - 15.0 g/dL   HCT 42.5 36.0 - 46.0 %   MCV 94.2 80.0 - 100.0 fL   MCH 30.2 26.0 - 34.0 pg   MCHC 32.0 30.0 - 36.0 g/dL   RDW 12.9 11.5 - 15.5 %   Platelets 259 150 - 400 K/uL   nRBC 0.0 0.0 - 0.2 %   Neutrophils Relative % 71 %   Neutro Abs 6.3 1.7 - 7.7 K/uL   Lymphocytes Relative 17 %   Lymphs Abs 1.5 0.7 - 4.0 K/uL   Monocytes Relative 10 %   Monocytes Absolute 0.9 0.1 - 1.0 K/uL   Eosinophils Relative 1 %   Eosinophils Absolute 0.1 0.0 - 0.5 K/uL   Basophils Relative 1 %   Basophils Absolute 0.1 0.0 - 0.1 K/uL   Immature Granulocytes 0 %   Abs Immature Granulocytes 0.02 0.00 - 0.07 K/uL    Comment: Performed at [facility], [address], [place], [place] [address]  I-stat chem 8, ED (not at [facility] or [facility])     Status: Abnormal  Collection Time: 10/04/2018 10:31 AM  Result Value Ref Range   Sodium 134  (L) 135 - 145 mmol/L   Potassium 4.1 3.5 - 5.1 mmol/L   Chloride 103 98 - 111 mmol/L   BUN 8 8 - 23 mg/dL   Creatinine, Ser 1.10 (H) 0.44 - 1.00 mg/dL   Glucose, Bld 128 (H) 70 - 99 mg/dL   Calcium, Ion 1.09 (L) 1.15 - 1.40 mmol/L   TCO2 23 22 - 32 mmol/L   Hemoglobin 14.6 12.0 - 15.0 g/dL   HCT 43.0 36.0 - 46.0 %  CBG monitoring, ED     Status: Abnormal   Collection Time: 09/09/2018 10:32 AM  Result Value Ref Range   Glucose-Capillary 123 (H) 70 - 99 mg/dL   Comment 1 Document in Chart   Troponin I (High Sensitivity)     Status: Abnormal   Collection Time: 09/24/2018 12:35 PM  Result Value Ref Range   Troponin I (High Sensitivity) 48 (H) <18 ng/L    Comment: (NOTE) Elevated high sensitivity troponin I (hsTnI) values and significant  changes across serial measurements may suggest ACS but many other  chronic and acute conditions are known to elevate hsTnI results.  Refer to the "Links" section for chest pain algorithms and additional  guidance. Performed at [facility], [place] [address], [place], [place] [address]    Dg Chest Portable 1 View  Result Date: 09/21/2018 CLINICAL DATA:  Shortness of breath EXAM: PORTABLE CHEST 1 VIEW COMPARISON:  Jun 18, 2009 FINDINGS: There is cardiomegaly. Pulmonary vascularity is within normal limits. There is interstitial edema in the lung bases. There is a small left pleural effusion with consolidation in the medial left base. No adenopathy. There is aortic atherosclerosis. Bones are osteoporotic. IMPRESSION: Cardiomegaly with bibasilar interstitial edema. There may be a degree of congestive heart failure. Airspace consolidation in the medial left base is concerning for focal pneumonia. Small left pleural effusion evident. Aortic Atherosclerosis (ICD10-I70.0). Electronically Signed   By: Lowella Grip III M.D.   On: 09/24/2018 11:20   Dg Shoulder Right Portable  Result Date: 09/16/2018 CLINICAL DATA:  Right shoulder pain.  Shortness of breath.  EXAM: PORTABLE RIGHT SHOULDER COMPARISON:  Portable chest 09/19/2018. FINDINGS: Diffuse osteopenia. Acromioclavicular and glenohumeral degenerative change. Calcification noted over the supraspinatus tendon. No evidence of acute abnormality. No evidence of fracture, dislocation, or separation. IMPRESSION: 1. Diffuse osteopenia. Severe acromioclavicular and glenohumeral degenerative change. 2.  Calcific supraspinatus tendinosis. 3. No acute bony abnormality. No evidence of fracture, dislocation, or separation. Electronically Signed   By: Marcello Moores  Register   On: 09/05/2018 13:31    Pending Labs Unresulted Labs (From admission, onward)    Start     Ordered   09/18/18 9563  Basic metabolic panel  Tomorrow morning,   R     09/09/2018 1339   09/18/18 0500  CBC  Tomorrow morning,   R     10/04/2018 1339   09/24/2018 1340  CBC  (heparin)  Once,   STAT    Comments: Baseline for heparin therapy IF NOT ALREADY DRAWN.  Notify MD if PLT < 100 K.    09/13/2018 1339   09/23/2018 1340  Creatinine, serum  (heparin)  Once,   STAT    Comments: Baseline for heparin therapy IF NOT ALREADY DRAWN.    09/29/2018 1339   09/09/2018 1340  TSH  Once,   STAT     09/18/2018 1339   09/27/2018 1140  SARS CORONAVIRUS 2 Nasal  Swab Aptima Multi Swab  (Asymptomatic/Tier 2 Patients Labs)  Once,   STAT    Question Answer Comment  Is this test for diagnosis or screening Screening   Symptomatic for COVID-19 as defined by CDC No   Hospitalized for COVID-19 No   Admitted to ICU for COVID-19 No   Previously tested for COVID-19 Yes   Resident in a congregate (group) care setting No   Employed in healthcare setting No   Pregnant No      10/02/2018 1140          Vitals/Pain Today's Vitals   09/11/2018 1215 10/05/2018 1252 09/21/2018 1300 09/11/2018 1345  BP: 133/84 119/68 118/71 (!) 117/99  Pulse: (!) 147 (!) 116 (!) 117 84  Resp: (!) 23 18 19  (!) 21  Temp:      TempSrc:      SpO2: 98% 99% 97% 97%  PainSc:        Isolation Precautions No  active isolations  Medications Medications  diltiazem (CARDIZEM) 1 mg/mL load via infusion 10 mg (10 mg Intravenous Bolus from Bag 10/03/2018 1146)    And  diltiazem (CARDIZEM) 100 mg in dextrose 5% 175mL (1 mg/mL) infusion (15 mg/hr Intravenous Rate/Dose Change 09/19/2018 1331)  heparin injection 5,000 Units (has no administration in time range)  sodium chloride flush (NS) 0.9 % injection 3 mL (3 mLs Intravenous Given 09/16/2018 1350)  acetaminophen (TYLENOL) tablet 650 mg (has no administration in time range)    Or  acetaminophen (TYLENOL) suppository 650 mg (has no administration in time range)  ondansetron (ZOFRAN) tablet 4 mg (has no administration in time range)    Or  ondansetron (ZOFRAN) injection 4 mg (has no administration in time range)  polyethylene glycol (MIRALAX / GLYCOLAX) packet 17 g (has no administration in time range)  sodium chloride 0.9 % bolus 500 mL (0 mLs Intravenous Stopped 09/21/2018 1143)  diltiazem (CARDIZEM) injection 10 mg (10 mg Intravenous Given 09/16/2018 1026)  albuterol (VENTOLIN HFA) 108 (90 Base) MCG/ACT inhaler 2 puff (2 puffs Inhalation Given 09/05/2018 1029)  furosemide (LASIX) injection 20 mg (20 mg Intravenous Given 09/24/2018 1240)    Mobility walks with person assist Moderate fall risk   Focused Assessments Cardiac Assessment Handoff:  Cardiac Rhythm: Atrial fibrillation Lab Results  Component Value Date   CKTOTAL 57 06/19/2009   CKMB 1.7 06/19/2009   TROPONINI 0.01        NO INDICATION OF MYOCARDIAL INJURY. 06/19/2009   Lab Results  Component Value Date   DDIMER (H) 06/19/2009    0.85        AT THE INHOUSE ESTABLISHED CUTOFF VALUE OF 0.48 ug/mL FEU, THIS ASSAY HAS BEEN DOCUMENTED IN THE LITERATURE TO HAVE A SENSITIVITY AND NEGATIVE PREDICTIVE VALUE OF AT LEAST 98 TO 99%.  THE TEST RESULT SHOULD BE CORRELATED WITH AN ASSESSMENT OF THE CLINICAL PROBABILITY OF DVT / VTE.   Does the Patient currently have chest pain? No      R Recommendations: See Admitting Provider Note  Report given to:   Additional Notes: Pt is alert oriented x 4. Son is caregiver

## 2018-09-17 NOTE — ED Provider Notes (Signed)
[facility] EMERGENCY DEPARTMENT Provider Note   CSN: [id number] Arrival date & time: 09/25/2018  1003     History   Chief Complaint Chief Complaint  Patient presents with  . Shortness of Breath    HPI [name] is a 85 y.o. female.     HPI 85 year old female presents with dyspnea.  Started about 3 days ago according to patient and later son.  The patient has chronic leg swelling that waxes and wanes and is on Lasix.  She is also been having right shoulder pain this morning.  No chest pain but she feels like is very difficult to breathe.  EMS noted possible SVT and was going to give adenosine but the first blood pressure was low.  Gave a little bit of fluid and then brought her here.  Past Medical History:  Diagnosis Date  . Anaphylactic shock 1990  . Arthritis    cervical, spurs  . Back pain 05/16/2011  . Bowel obstruction   . Broken leg 1996  . Cancer    basal cells on back, shoulder and nose  . Chicken pox as a child  . DOE (dyspnea on exertion) 05/16/2011  . Fainting spell    X 1 time, at home in hospital 3 days  . History of shingles   . Hyperlipidemia 06/19/2011  . Hypertension   . Hyponatremia 11/12/2011  . Measles as a child  . Mumps as a child  . Neck pain, chronic   . Pericarditis   . Preventative health care 05/16/2011  . Psoriasis    scalp  . Sacroiliac dysfunction 11/12/2011  . Seborrhea capitis   . Seborrheic keratosis   . UTI (lower urinary tract infection) 06/23/2011    Patient Active Problem List   Diagnosis Date Noted  . Abnormal findings on radiological examination of gastrointestinal tract 10/17/2014  . Ileus ([place])   . Abnormal CT of the abdomen   . Left ear pain 09/28/2014  . Malnutrition of moderate degree ([place]) 09/28/2014  . Compression fracture of thoracic spine, non-traumatic ([place])   . Thoracic compression fracture ([place]) 09/27/2014  . Abdominal pain 09/27/2014  . Constipation 09/27/2014  . Hyponatremia 11/12/2011  .  Sacroiliac dysfunction 11/12/2011  . UTI (lower urinary tract infection) 06/23/2011  . Hyperlipidemia 06/19/2011  . Aortic stenosis 06/18/2011  . Carotid bruit 05/27/2011  . Back pain 05/16/2011  . DOE (dyspnea on exertion) 05/16/2011  . Preventative health care 05/16/2011  . Psoriasis   . Hypertension   . Arthritis   . Neck pain, chronic   . History of shingles   . Seborrhea capitis   . Seborrheic keratosis   . Pericarditis     Past Surgical History:  Procedure Laterality Date  . back disc repair  1979   low back with LLE radiculopathy  . biopsy left leg  10-23-11   benign  . BREAST LUMPECTOMY Left 1947  . CATARACT EXTRACTION, BILATERAL    . FEMUR SURGERY Left   . TOTAL KNEE ARTHROPLASTY Bilateral 1990  . TUBAL LIGATION       OB History   No obstetric history on file.      Home Medications    Prior to Admission medications   Medication Sig Start Date End Date Taking? Authorizing Provider  acetaminophen (TYLENOL) 500 MG tablet Take 500 mg by mouth every 4 (four) hours as needed for mild pain, moderate pain or headache (Max 4 doses in 24 hours).    Yes [provider]  amLODipine (NORVASC) 5 MG tablet Take 1 tablet (5 mg total) by mouth daily. 05/16/11  Yes Mosie Lukes, MD  docusate sodium (COLACE) 100 MG capsule Take 1 capsule (100 mg total) by mouth 2 (two) times daily as needed for mild constipation (you can take it twice a day if no BM in 24hours). 10/01/14  Yes Rai, Ripudeep K, MD  furosemide (LASIX) 20 MG tablet Take 20 mg by mouth as needed for fluid.  07/27/18  Yes [provider]  metroNIDAZOLE (METROCREAM) 0.75 % cream Apply 1 application topically as needed (rash on face).  07/27/18  Yes [provider]  polyethylene glycol (MIRALAX / GLYCOLAX) packet Take 17 g by mouth daily. Patient taking differently: Take 17 g by mouth daily as needed for mild constipation or moderate constipation.  10/01/14  Yes Rai, Ripudeep K, MD   HYDROcodone-acetaminophen (NORCO/VICODIN) 5-325 MG per tablet Take 1 tablet by mouth every 6 (six) hours as needed for moderate pain or severe pain. Patient not taking: Reported on 10/14/2014 10/01/14   Rai, Vernelle Emerald, MD  pantoprazole (PROTONIX) 40 MG tablet Take 1 tablet (40 mg total) by mouth daily. Patient not taking: Reported on 10/14/2014 10/01/14   Mendel Corning, MD  witch hazel-glycerin (TUCKS) pad Apply topically 2 (two) times daily. Patient taking differently: Apply topically 2 (two) times daily. After bm's 10/01/14   Rai, Vernelle Emerald, MD    Family History Family History  Problem Relation Age of Onset  . Ovarian cancer Mother   . Pancreatic cancer Father   . Colon cancer Neg Hx   . Diabetes Son        type 2  . COPD Son        smoked  . Emphysema Son   . Colon polyps Neg Hx   . Esophageal cancer Neg Hx   . Other Son        laryngeal dysplasia  . Kidney disease Neg Hx   . Liver disease Neg Hx   . Heart disease Son     Social History Social History   Tobacco Use  . Smoking status: Former Smoker    Years: 41.00    Types: Cigarettes    Quit date: 02/05/1996    Years since quitting: 22.6  . Smokeless tobacco: Never Used  Substance Use Topics  . Alcohol use: No    Alcohol/week: 0.0 standard drinks  . Drug use: No     Allergies   Diclofenac sodium, Ibuprofen, Lisinopril, Adhesive [tape], Codeine, and Tizanidine   Review of Systems Review of Systems  Constitutional: Negative for fever.  Respiratory: Positive for cough (chronic, unchanged) and shortness of breath.   Cardiovascular: Positive for palpitations and leg swelling. Negative for chest pain.  Musculoskeletal: Positive for arthralgias.  All other systems reviewed and are negative.    Physical Exam Updated Vital Signs BP (!) 124/94   Pulse (!) 58   Temp 98.4 F (36.9 C) (Oral)   Resp 17   SpO2 98%   Physical Exam Vitals signs and nursing note reviewed.  Constitutional:      General: She is in  acute distress.     Appearance: She is well-developed. She is not diaphoretic.  HENT:     Head: Normocephalic and atraumatic.     Right Ear: External ear normal.     Left Ear: External ear normal.     Nose: Nose normal.  Eyes:     General:  Right eye: No discharge.        Left eye: No discharge.  Cardiovascular:     Rate and Rhythm: Regular rhythm. Tachycardia present.     Heart sounds: Normal heart sounds.  Pulmonary:     Effort: Pulmonary effort is normal. Tachypnea present. No accessory muscle usage.     Breath sounds: Decreased breath sounds and wheezing present.  Abdominal:     Palpations: Abdomen is soft.     Tenderness: There is no abdominal tenderness.  Musculoskeletal:     Right shoulder: She exhibits tenderness (mild, posterior). She exhibits normal range of motion.     Right lower leg: Edema present.     Left lower leg: Edema present.     Comments: Trace pitting edema to BLE  Skin:    General: Skin is warm and dry.  Neurological:     Mental Status: She is alert.  Psychiatric:        Mood and Affect: Mood is not anxious.      ED Treatments / Results  Labs (all labs ordered are listed, but only abnormal results are displayed) Labs Reviewed  COMPREHENSIVE METABOLIC PANEL - Abnormal; Notable for the following components:      Result Value   CO2 21 (*)    Glucose, Bld 131 (*)    BUN 7 (*)    Creatinine, Ser 1.22 (*)    GFR calc non Af Amer 39 (*)    GFR calc Af Amer 45 (*)    All other components within normal limits  BRAIN NATRIURETIC PEPTIDE - Abnormal; Notable for the following components:   B Natriuretic Peptide 418.6 (*)    All other components within normal limits  I-STAT CHEM 8, ED - Abnormal; Notable for the following components:   Sodium 134 (*)    Creatinine, Ser 1.10 (*)    Glucose, Bld 128 (*)    Calcium, Ion 1.09 (*)    All other components within normal limits  CBG MONITORING, ED - Abnormal; Notable for the following components:    Glucose-Capillary 123 (*)    All other components within normal limits  TROPONIN I (HIGH SENSITIVITY) - Abnormal; Notable for the following components:   Troponin I (High Sensitivity) 45 (*)    All other components within normal limits  SARS CORONAVIRUS 2  CBC WITH DIFFERENTIAL/PLATELET  TROPONIN I (HIGH SENSITIVITY)    EKG EKG Interpretation  Date/Time:  Thursday September 17 2018 11:14:23 EDT Ventricular Rate:  135 PR Interval:    QRS Duration: 134 QT Interval:  352 QTC Calculation: 528 R Axis:   46 Text Interpretation:  Atrial fibrillation with rvr Nonspecific intraventricular conduction delay Anteroseptal infarct, old Nonspecific T abnormalities, lateral leads Confirmed by Sherwood Gambler [phone]) on 09/16/2018 11:51:43 AM   Radiology Dg Chest Portable 1 View  Result Date: 09/30/2018 CLINICAL DATA:  Shortness of breath EXAM: PORTABLE CHEST 1 VIEW COMPARISON:  Jun 18, 2009 FINDINGS: There is cardiomegaly. Pulmonary vascularity is within normal limits. There is interstitial edema in the lung bases. There is a small left pleural effusion with consolidation in the medial left base. No adenopathy. There is aortic atherosclerosis. Bones are osteoporotic. IMPRESSION: Cardiomegaly with bibasilar interstitial edema. There may be a degree of congestive heart failure. Airspace consolidation in the medial left base is concerning for focal pneumonia. Small left pleural effusion evident. Aortic Atherosclerosis (ICD10-I70.0). Electronically Signed   By: Lowella Grip III M.D.   On: 09/18/2018 11:20    Procedures .Critical Care  Performed by: Sherwood Gambler, MD Authorized by: Sherwood Gambler, MD   Critical care provider statement:    Critical care time (minutes):  30   Critical care time was exclusive of:  Separately billable procedures and treating other patients   Critical care was necessary to treat or prevent imminent or life-threatening deterioration of the following conditions:  Cardiac  failure   Critical care was time spent personally by me on the following activities:  Discussions with consultants, evaluation of patient's response to treatment, examination of patient, ordering and performing treatments and interventions, ordering and review of laboratory studies, ordering and review of radiographic studies, pulse oximetry, re-evaluation of patient's condition, obtaining history from patient or surrogate and review of old charts   (including critical care time)  Medications Ordered in ED Medications  diltiazem (CARDIZEM) 1 mg/mL load via infusion 10 mg (10 mg Intravenous Bolus from Bag 09/16/2018 1146)    And  diltiazem (CARDIZEM) 100 mg in dextrose 5% 19mL (1 mg/mL) infusion (5 mg/hr Intravenous New Bag/Given 09/28/2018 1145)  furosemide (LASIX) injection 20 mg (has no administration in time range)  sodium chloride 0.9 % bolus 500 mL (0 mLs Intravenous Stopped 09/16/2018 1143)  diltiazem (CARDIZEM) injection 10 mg (10 mg Intravenous Given 09/11/2018 1026)  albuterol (VENTOLIN HFA) 108 (90 Base) MCG/ACT inhaler 2 puff (2 puffs Inhalation Given 09/18/2018 1029)     Initial Impression / Assessment and Plan / ED Course  I have reviewed the triage vital signs and the nursing notes.  Pertinent labs & imaging results that were available during my care of the patient were reviewed by me and considered in my medical decision making (see chart for details).        I think the patient's dyspnea is from A. fib with RVR.  Initially she was given a bolus of Cardizem which transiently brought her heart rate down and she felt much better.  Went back in to rapid ventricular rate and was placed on Cardizem bolus and drip.  Is doing much better.  Perhaps some mild CHF and she will also be diuresed.  My suspicion for PE is pretty low.  Her shoulder pain could be referred pain for ACS but it does hurt a little bit with movement and is point tender to the posterior aspect of the shoulder.  I will get an  x-ray.  Otherwise, will admit to the internal medicine teaching service.  ALYRA PATTY was evaluated in Emergency Department on 09/27/2018 for the symptoms described in the history of present illness. She was evaluated in the context of the global COVID-19 pandemic, which necessitated consideration that the patient might be at risk for infection with the SARS-CoV-2 virus that causes COVID-19. Institutional protocols and algorithms that pertain to the evaluation of patients at risk for COVID-19 are in a state of rapid change based on information released by regulatory bodies including the CDC and federal and state organizations. These policies and algorithms were followed during the patient's care in the ED.   Final Clinical Impressions(s) / ED Diagnoses   Final diagnoses:  Atrial fibrillation with RVR [facility])    ED Discharge Orders    None       Sherwood Gambler, MD 09/11/2018 [phone]

## 2018-09-17 NOTE — ED Triage Notes (Signed)
Pt brought in by EMS from home for c/o right shoulder pain/ neck pain and the SOB that began today ; pt has a chronic cough ; denies any fevers; upon ems arrival , patient was found to be in SVT with a rate of 150's and had a bp of 80/54; ems gave 500 ml bolus and  bp went up 150/100; pt denies any chest pain but states " I cant breath"

## 2018-09-17 NOTE — Progress Notes (Signed)
ANTICOAGULATION CONSULT NOTE - Initial Consult  Pharmacy Consult for Heparin Indication: Atrial Fibrillation  Allergies  Allergen Reactions  . Diclofenac Sodium Anaphylaxis  . Ibuprofen Anaphylaxis  . Lisinopril     Fall hazard, dizzy  . Adhesive [Tape]   . Codeine Nausea Only  . Tizanidine     "knocked out cold"    Patient Measurements: Weight: 96.1 kg Height: 66 inches Heparin Dosing Weight: 80.7 kg  Vital Signs: Temp: 98.6 F (37 C) (08/13 1533) Temp Source: Oral (08/13 1533) BP: 157/88 (08/13 1533) Pulse Rate: 76 (08/13 1533)  Labs: Recent Labs    09/10/2018 1015 09/06/2018 1031 09/26/2018 1235  HGB 13.6 14.6  --   HCT 42.5 43.0  --   PLT 259  --   --   CREATININE 1.22* 1.10*  --   TROPONINIHS 45*  --  48*    Estimated Creatinine Clearance: 38.9 mL/min (A) (by C-G formula based on SCr of 1.1 mg/dL (H)).   Assessment: 85 yr old female with new atrial fibrillation with RVR. Medical hx includes: abdominal pain/hx of ileus, HTN, hx pericarditis, significant atherosclerosis on past CTs.  CBC WNL  Goal of Therapy:  Heparin level 0.3-0.7 units/ml Monitor platelets by anticoagulation protocol: Yes   Plan:  Heparin 4000 units bolus X 1, followed by heparin infusion at 1150  units/hr Check 8-hr heparin level, then daily Monitor CBC daily Monitor for signs/sx of bleeding  Gillermina Hu, PharmD, BCPS, [facility] Clinical Pharmacist 09/05/2018,6:12 PM

## 2018-09-18 ENCOUNTER — Inpatient Hospital Stay (HOSPITAL_COMMUNITY): Payer: Medicare Other

## 2018-09-18 DIAGNOSIS — Z79899 Other long term (current) drug therapy: Secondary | ICD-10-CM

## 2018-09-18 DIAGNOSIS — J90 Pleural effusion, not elsewhere classified: Secondary | ICD-10-CM | POA: Diagnosis present

## 2018-09-18 DIAGNOSIS — I4891 Unspecified atrial fibrillation: Secondary | ICD-10-CM

## 2018-09-18 DIAGNOSIS — I350 Nonrheumatic aortic (valve) stenosis: Secondary | ICD-10-CM

## 2018-09-18 DIAGNOSIS — L821 Other seborrheic keratosis: Secondary | ICD-10-CM

## 2018-09-18 DIAGNOSIS — R011 Cardiac murmur, unspecified: Secondary | ICD-10-CM

## 2018-09-18 DIAGNOSIS — M25511 Pain in right shoulder: Secondary | ICD-10-CM

## 2018-09-18 DIAGNOSIS — R140 Abdominal distension (gaseous): Secondary | ICD-10-CM | POA: Diagnosis present

## 2018-09-18 DIAGNOSIS — R109 Unspecified abdominal pain: Secondary | ICD-10-CM

## 2018-09-18 DIAGNOSIS — Z8719 Personal history of other diseases of the digestive system: Secondary | ICD-10-CM

## 2018-09-18 LAB — ECHOCARDIOGRAM COMPLETE
Height: 66 in
Weight: 3374.4 oz

## 2018-09-18 LAB — ECHOCARDIOGRAM LIMITED
Height: 66 in
Weight: 3374.4 oz

## 2018-09-18 LAB — CBC
HCT: 37.2 % (ref 36.0–46.0)
Hemoglobin: 12 g/dL (ref 12.0–15.0)
MCH: 30.3 pg (ref 26.0–34.0)
MCHC: 32.3 g/dL (ref 30.0–36.0)
MCV: 93.9 fL (ref 80.0–100.0)
Platelets: 242 10*3/uL (ref 150–400)
RBC: 3.96 MIL/uL (ref 3.87–5.11)
RDW: 13 % (ref 11.5–15.5)
WBC: 7.1 10*3/uL (ref 4.0–10.5)
nRBC: 0 % (ref 0.0–0.2)

## 2018-09-18 LAB — BASIC METABOLIC PANEL
Anion gap: 11 (ref 5–15)
BUN: 9 mg/dL (ref 8–23)
CO2: 24 mmol/L (ref 22–32)
Calcium: 8.7 mg/dL — ABNORMAL LOW (ref 8.9–10.3)
Chloride: 100 mmol/L (ref 98–111)
Creatinine, Ser: 1.16 mg/dL — ABNORMAL HIGH (ref 0.44–1.00)
GFR calc Af Amer: 48 mL/min — ABNORMAL LOW (ref >60)
GFR calc non Af Amer: 41 mL/min — ABNORMAL LOW (ref >60)
Glucose, Bld: 159 mg/dL — ABNORMAL HIGH (ref 70–99)
Potassium: 3.9 mmol/L (ref 3.5–5.1)
Sodium: 135 mmol/L (ref 135–145)

## 2018-09-18 LAB — HEPARIN LEVEL (UNFRACTIONATED)
Heparin Unfractionated: 0.83 IU/mL — ABNORMAL HIGH (ref 0.30–0.70)
Heparin Unfractionated: 0.46 IU/mL (ref 0.30–0.70)

## 2018-09-18 MED ORDER — FUROSEMIDE 10 MG/ML IJ SOLN
20.0000 mg | Freq: Once | INTRAMUSCULAR | Status: AC
  Administered 2018-09-18: 18:00:00 20 mg via INTRAVENOUS
  Filled 2018-09-18: qty 2

## 2018-09-18 MED ORDER — METOPROLOL TARTRATE 5 MG/5ML IV SOLN
2.5000 mg | Freq: Four times a day (QID) | INTRAVENOUS | Status: DC | PRN
  Administered 2018-09-19 – 2018-10-10 (×8): 2.5 mg via INTRAVENOUS
  Filled 2018-09-18 (×10): qty 5

## 2018-09-18 MED ORDER — LEVALBUTEROL HCL 0.63 MG/3ML IN NEBU
0.6300 mg | INHALATION_SOLUTION | Freq: Three times a day (TID) | RESPIRATORY_TRACT | Status: DC | PRN
  Administered 2018-09-19 – 2018-09-20 (×3): 0.63 mg via RESPIRATORY_TRACT
  Filled 2018-09-18 (×3): qty 3

## 2018-09-18 NOTE — Progress Notes (Signed)
Pt has had purewick since before I came on shift. While changing equipment and emptying canister, there was no documentation of when it was originally placed, so I made a documentation of it.

## 2018-09-18 NOTE — Consult Note (Addendum)
Cardiology Consultation:   Patient ID: [name] MRN: [id number]; DOB: 11-Nov-1924  Admit date: 09/20/2018 Date of Consult: 09/18/2018  Primary Care Provider: Jettie Booze, NP Primary Cardiologist: Shelva Majestic, MD  Primary Electrophysiologist:  None    Patient Profile:   [name] is a 85 y.o. female with a hx of HTN, ileus, mild aortic stenosis (2013) and pericarditis who is being seen today for the evaluation of atrial fibrillation with RVR at the request of Dr. Heber [place].  History of Present Illness:   [name] was previously seen by Dr. Lia Foyer in 2013. She has a history of mild aortic stenosis by echo in 2013, pericarditis (details unknown, but prior to 2013), and hypertension managed with norvasc. She has chronic lower extremity edema and takes lasix at home PRN.  She presented to [facility] with complaints of dyspnea and chest pain found to be in Afib RVR. Cardizem bolus given with some resolution in rate, but returned to RVR. Cardizem drip and heparin drip started and admitted to medicine service.   HS troponin 45 --> 48 EKG with Afib RVR, HR  BNP 418 with sCr 1.22, 4.3  Cardiology was consulted.  Son is at bedside that provides some history. She has had ongoing dyspnea for the past week. Yesterday morning when he checked on her, she complained of chest pain radiating to both shoulders and dyspnea. She does not have a history of MI. On exam, she has been nauseous and vomiting. She denies current chest pain, but does report epigastric pain. Vomitus does not appear consistent with GI bleed.   Heart Pathway Score:     Past Medical History:  Diagnosis Date   Anaphylactic shock 1990   Arthritis    cervical, spurs   Back pain 05/16/2011   Bowel obstruction (HCC)    Broken leg 1996   Cancer ([place])    basal cells on back, shoulder and nose   Chicken pox as a child   DOE (dyspnea on exertion) 05/16/2011   Fainting spell    X 1 time, at home in hospital 3 days    History of shingles    Hyperlipidemia 06/19/2011   Hypertension    Hyponatremia 11/12/2011   Measles as a child   Mumps as a child   Neck pain, chronic    Pericarditis    Preventative health care 05/16/2011   Psoriasis    scalp   Sacroiliac dysfunction 11/12/2011   Seborrhea capitis    Seborrheic keratosis    UTI (lower urinary tract infection) 06/23/2011    Past Surgical History:  Procedure Laterality Date   back disc repair  1979   low back with LLE radiculopathy   biopsy left leg  10-23-11   benign   BREAST LUMPECTOMY Left 1947   CATARACT EXTRACTION, BILATERAL     FEMUR SURGERY Left    TOTAL KNEE ARTHROPLASTY Bilateral 1990   TUBAL LIGATION       Home Medications:  Prior to Admission medications   Medication Sig Start Date End Date Taking? Authorizing Provider  acetaminophen (TYLENOL) 500 MG tablet Take 500 mg by mouth every 4 (four) hours as needed for mild pain, moderate pain or headache (Max 4 doses in 24 hours).    Yes [provider]  amLODipine (NORVASC) 5 MG tablet Take 1 tablet (5 mg total) by mouth daily. 05/16/11  Yes Mosie Lukes, MD  docusate sodium (COLACE) 100 MG capsule Take 1 capsule (100 mg total) by mouth 2 (  two) times daily as needed for mild constipation (you can take it twice a day if no BM in 24hours). 10/01/14  Yes Rai, Ripudeep K, MD  furosemide (LASIX) 20 MG tablet Take 20 mg by mouth as needed for fluid.  07/27/18  Yes [provider]  metroNIDAZOLE (METROCREAM) 0.75 % cream Apply 1 application topically as needed (rash on face).  07/27/18  Yes [provider]  polyethylene glycol (MIRALAX / GLYCOLAX) packet Take 17 g by mouth daily. Patient taking differently: Take 17 g by mouth daily as needed for mild constipation or moderate constipation.  10/01/14  Yes Rai, Vernelle Emerald, MD  witch hazel-glycerin (TUCKS) pad Apply topically 2 (two) times daily. Patient taking differently: Apply topically 2 (two) times  daily. After bm's 10/01/14   Rai, Vernelle Emerald, MD    Inpatient Medications: Scheduled Meds:  amLODipine  5 mg Oral Daily   polyethylene glycol  17 g Oral BID   sodium chloride flush  3 mL Intravenous Q12H   Continuous Infusions:  diltiazem (CARDIZEM) infusion 15 mg/hr (09/18/18 1304)   heparin 950 Units/hr (09/18/18 0420)   PRN Meds: acetaminophen **OR** acetaminophen, ondansetron **OR** ondansetron (ZOFRAN) IV, sodium chloride flush  Allergies:    Allergies  Allergen Reactions   Diclofenac Sodium Anaphylaxis   Ibuprofen Anaphylaxis   Lisinopril     Fall hazard, dizzy   Adhesive [Tape]    Codeine Nausea Only   Tizanidine     "knocked out cold"    Social History:   Social History   Socioeconomic History   Marital status: Widowed    Spouse name: Not on file   Number of children: Not on file   Years of education: Not on file   Highest education level: Not on file  Occupational History   Not on file  Social Needs   Financial resource strain: Not on file   Food insecurity    Worry: Not on file    Inability: Not on file   Transportation needs    Medical: Not on file    Non-medical: Not on file  Tobacco Use   Smoking status: Former Smoker    Years: 41.00    Types: Cigarettes    Quit date: 02/05/1996    Years since quitting: 22.6   Smokeless tobacco: Never Used  Substance and Sexual Activity   Alcohol use: No    Alcohol/week: 0.0 standard drinks   Drug use: No   Sexual activity: Never  Lifestyle   Physical activity    Days per week: Not on file    Minutes per session: Not on file   Stress: Not on file  Relationships   Social connections    Talks on phone: Not on file    Gets together: Not on file    Attends religious service: Not on file    Active member of club or organization: Not on file    Attends meetings of clubs or organizations: Not on file    Relationship status: Not on file   Intimate partner violence    Fear of  current or ex partner: Not on file    Emotionally abused: Not on file    Physically abused: Not on file    Forced sexual activity: Not on file  Other Topics Concern   Not on file  Social History Narrative   Not on file    Family History:    Family History  Problem Relation Age of Onset   Ovarian cancer Mother  Pancreatic cancer Father    Diabetes Son        type 2   COPD Son        smoked   Other Son        laryngeal dysplasia   Emphysema Son    Heart disease Son    Colon cancer Neg Hx    Colon polyps Neg Hx    Esophageal cancer Neg Hx    Kidney disease Neg Hx    Liver disease Neg Hx      ROS:  Please see the history of present illness.   All other ROS reviewed and negative.     Physical Exam/Data:   Vitals:   09/10/2018 1839 09/18/18 0218 09/18/18 0318 09/18/18 0900  BP:  121/89 (!) 127/39 111/79  Pulse:  (!) 131 75 73  Resp:  (!) 23 (!) 31 (!) 25  Temp:  98 F (36.7 C) 97.9 F (36.6 C) 98.1 F (36.7 C)  TempSrc:  Oral Oral Oral  SpO2:  92% 94% 96%  Weight: 96.1 kg  95.7 kg   Height: 5\' 6"  (1.676 m)       Intake/Output Summary (Last 24 hours) at 09/18/2018 1601 Last data filed at 09/18/2018 0315 Gross per 24 hour  Intake --  Output 800 ml  Net -800 ml   Last 3 Weights 09/18/2018 09/16/2018 10/14/2014  Weight (lbs) 210 lb 14.4 oz 211 lb 13.8 oz 198 lb 9.6 oz  Weight (kg) 95.664 kg 96.1 kg 90.084 kg     Body mass index is 34.04 kg/m.  General:  Elderly female, intermittent vomiting HEENT: normal Neck: no JVD Vascular: No carotid bruits Cardiac:  Irregular rhythm, tachycardic rate Lungs: wheezing bilaterally, diminished in left base Abd: epigastric tenderness  Ext: B LE L > R edema Musculoskeletal:  No deformities, BUE and BLE strength normal and equal Skin: warm and dry  Neuro:  CNs 2-12 intact, no focal abnormalities noted Psych:  Normal affect   EKG:  The EKG was personally reviewed and demonstrates:  Atrial fibrillation with RVR,  ventricular rate 135, appears to have a rate related bundle with QRS 134,  Telemetry:  Telemetry was personally reviewed and demonstrates:  Afib RVR with rates in the 90-110s  Relevant CV Studies:  Echo 09/18/18: 1. The left ventricle has normal systolic function, with an ejection fraction of 55-60%. The cavity size was normal. There is moderately increased left ventricular wall thickness. Left ventricular diastolic function could not be evaluated secondary to  atrial fibrillation.  2. The right ventricle has normal systolic function. The cavity was normal. There is no increase in right ventricular wall thickness. Right ventricular systolic pressure could not be assessed.  3. The mitral valve is grossly normal.  4. The tricuspid valve is grossly normal.  5. The aortic valve was not well visualized. Moderate calcification of the aortic valve. Aortic valve regurgitation was not assessed by color flow Doppler.  6. Very poor visualization of the AV. Cannot assess number of cusps. There is thickening and calcificaiton of the AV. By doppler there is mild to moderate AS with mean AVG 26mmHg and Vmax 276cm/s.  7. The aorta is normal unless otherwise noted.  8. The inferior vena cava was dilated in size with <50% respiratory variability.  9. The interatrial septum appears to be lipomatous. 10. Recommend repeat limited echo o f the AV.   Limited echo  Mitral Valve: There is mild mitral annular calcification present.  Aortic Valve: The aortic valve  is tricuspid . Moderate thickening of the aortic valve. Severe calcifcation of the aortic valve. Aortic valve regurgitation was not visualized by color flow Doppler. There is Moderate stenosis of the aortic valve, with a  calculated valve area of 1.07 cm. Mild to moderate aortic annular calcification noted. AV Mean Grad: 22.0 mmHg. LVOT/AV VTI ratio: 0.34. AV Vmax: 298.00 cm/s.  Laboratory Data:  High Sensitivity Troponin:   Recent Labs  Lab  09/09/2018 1015 09/06/2018 1235  TROPONINIHS 45* 48*     Cardiac EnzymesNo results for input(s): TROPONINI in the last 168 hours. No results for input(s): TROPIPOC in the last 168 hours.  Chemistry Recent Labs  Lab 09/06/2018 1015 09/23/2018 1031 09/18/18 0351  NA 136 134* 135  K 4.3 4.1 3.9  CL 102 103 100  CO2 21*  --  24  GLUCOSE 131* 128* 159*  BUN 7* 8 9  CREATININE 1.22* 1.10* 1.16*  CALCIUM 9.0  --  8.7*  GFRNONAA 39*  --  41*  GFRAA 45*  --  48*  ANIONGAP 13  --  11    Recent Labs  Lab 09/22/2018 1015  PROT 6.7  ALBUMIN 3.5  AST 28  ALT 28  ALKPHOS 53  BILITOT 0.8   Hematology Recent Labs  Lab 09/10/2018 1015 10/04/2018 1031 09/18/18 0351  WBC 8.8  --  7.1  RBC 4.51  --  3.96  HGB 13.6 14.6 12.0  HCT 42.5 43.0 37.2  MCV 94.2  --  93.9  MCH 30.2  --  30.3  MCHC 32.0  --  32.3  RDW 12.9  --  13.0  PLT 259  --  242   BNP Recent Labs  Lab 09/27/2018 1015  BNP 418.6*    DDimer No results for input(s): DDIMER in the last 168 hours.   Radiology/Studies:  Dg Chest Portable 1 View  Result Date: 09/29/2018 CLINICAL DATA:  Shortness of breath EXAM: PORTABLE CHEST 1 VIEW COMPARISON:  Jun 18, 2009 FINDINGS: There is cardiomegaly. Pulmonary vascularity is within normal limits. There is interstitial edema in the lung bases. There is a small left pleural effusion with consolidation in the medial left base. No adenopathy. There is aortic atherosclerosis. Bones are osteoporotic. IMPRESSION: Cardiomegaly with bibasilar interstitial edema. There may be a degree of congestive heart failure. Airspace consolidation in the medial left base is concerning for focal pneumonia. Small left pleural effusion evident. Aortic Atherosclerosis (ICD10-I70.0). Electronically Signed   By: Lowella Grip III M.D.   On: 10/05/2018 11:20   Dg Shoulder Right Portable  Result Date: 09/10/2018 CLINICAL DATA:  Right shoulder pain.  Shortness of breath. EXAM: PORTABLE RIGHT SHOULDER COMPARISON:   Portable chest 09/11/2018. FINDINGS: Diffuse osteopenia. Acromioclavicular and glenohumeral degenerative change. Calcification noted over the supraspinatus tendon. No evidence of acute abnormality. No evidence of fracture, dislocation, or separation. IMPRESSION: 1. Diffuse osteopenia. Severe acromioclavicular and glenohumeral degenerative change. 2.  Calcific supraspinatus tendinosis. 3. No acute bony abnormality. No evidence of fracture, dislocation, or separation. Electronically Signed   By: Marcello Moores  Register   On: 09/18/2018 13:31   Dg Abd Portable 1 View  Result Date: 09/20/2018 CLINICAL DATA:  Possible blockage, history of ileus EXAM: PORTABLE ABDOMEN - 1 VIEW COMPARISON:  Comparison radiograph 10/14/2014 FINDINGS: Few air-filled loops of bowel measuring up to 4.8 cm have a haustra pattern compatible with colon. Remaining bowel gas pattern is unremarkable without convincing evidence of obstruction. Air overlies the rectal vault. Multilevel degenerative changes are present in the imaged portions  of the spine. Additional degenerative changes are present in the SI joints and both hips. Lung bases are clear. Calcified phleboliths are present the pelvis with additional arterial calcifications as well. IMPRESSION: Nonspecific bowel gas pattern without convincing evidence of obstruction. Electronically Signed   By: Lovena Le M.D.   On: 09/27/2018 14:57    Assessment and Plan:   1. Atrial fibrillation with RVR - new onset - Mg pending - K 3.9 - TSH pending - echocardiogram today with normal EF of 55-60%, mild to moderate AS - continue cardizem drip, now running at 15 mg/hr, HR in the 90s - would like to start PO lopressor for better rate control, but she is vomiting and not keeping down pills - will order 2.5 mg IV lopressor PRN for increased heart rate - plan to order PO lopressor once N/V resolved - hopefully this weekend - if not better-rate controlled with addition of PO lopressor, may consider  amiodarone vs TEE/DCCV next week   2. Mild to moderate AS - likely not contributing to her acute dyspnea   3. Wheezing bilaterally 4. Small left pleural effusion 5. Dyspnea - will order another dose of lasix 20 mg IV - xopenex ordered and respiratory consulted   6. Hypertension - hold norvasc with rate-controlling medications     For questions or updates, please contact [place] Please consult [email or web address] for contact info under     Signed, Ledora Bottcher, [place]  09/18/2018 4:01 PM   Patient seen and examined. Agree with assessment and plan.  [name] is a 85 year old female who is a former patient of Dr. Lia Foyer and last saw him in 2013.  She has a history of previously documented mild aortic stenosis by echo in 2013, a remote history of pericarditis, as well as a history of hypertension for which she had been on amlodipine.  She has had issues with lower extremity edema and takes furosemide on a as needed basis.  She lives with her son.  Yesterday morning, she developed symptoms of increasing shortness of breath and was found to have some vague chest discomfort.  She was found to have atrial fibrillation with RVR with ventricular rate at 152 bpm.  She was started on a Cardizem drip which ultimately has been titrated to 15 mg/h.  Subsequent ECG had shown A. fib at 135.  Presently she continues to be in atrial fibrillation with ventricular rate at approximately 98 to 110 bpm.  She admits to some epigastric discomfort.  She has not had any solid intake since Tuesday, September 15, 2018.  Chest x-ray has shown no megaly with bilateral interstitial edema, a left pleural effusion, and concern for airspace consolidation in the medial left base.  She was also noted to have aortic atherosclerosis.  Presently, her blood pressure is controlled at 120/78.  Pulse is irregularly irregular at approximately 100.  There was  mildly increased JVD.  She had diffuse rhonchi with expiratory  wheezes and decreased breath sounds at her bases.  Rhythm was irregular irregular with a 2/6 murmur in the aortic region compatible with aortic stenosis.  She had some midepigastric discomfort.  Abdomen was mildly distended.  There are mild venous stasis changes to her lower extremity with trivial if any edema.  Neurologic she was nonfocal.  HS troponens are minimally increased but not suggestive of ACS and most likely due to mild demand ischemia exacerbated by her AF with RVR.  BNP is increased at 418.6 consistent with diastolic heart  failure.  An echo Doppler study today shows an EF of 55 to 60%.  Her aortic valve is moderately calcified and moderate AS with a mean gradient of 22 and calculated valve area of 1.07 cm.  Presently she is on Cardizem drip at 15 mg/h.  Recommend  furosemide 20 mg iv now.  May also consider Xopenex nebulizer in light of her wheezing which should avoid exacerbation of tachycardia.  At present, she does not believe she can swallow pills and keep up food and therefore at present we will not institute oral metoprolol.  However consider initiating metoprolol orally tomorrow.  If AF rate continues to be increased IV amiodarone can be instituted short-term for additional rate control benefit.  Colleagues will follow in a.m.  Troy Sine, MD, [facility] 09/18/2018 5:02 PM

## 2018-09-18 NOTE — Progress Notes (Signed)
[place] for Heparin Indication: Atrial Fibrillation  Allergies  Allergen Reactions  . Diclofenac Sodium Anaphylaxis  . Ibuprofen Anaphylaxis  . Lisinopril     Fall hazard, dizzy  . Adhesive [Tape]   . Codeine Nausea Only  . Tizanidine     "knocked out cold"    Patient Measurements: Weight: 96.1 kg Height: 66 inches Heparin Dosing Weight: 80.7 kg  Vital Signs: Temp: 98 F (36.7 C) (08/14 0218) Temp Source: Oral (08/14 0218) BP: 121/89 (08/14 0218) Pulse Rate: 131 (08/14 0218)  Labs: Recent Labs    10/01/2018 1015 09/18/2018 1031 09/24/2018 1235 09/14/2018 1827 09/18/18 0350 09/18/18 0351  HGB 13.6 14.6  --   --   --  12.0  HCT 42.5 43.0  --   --   --  37.2  PLT 259  --   --   --   --  242  APTT  --   --   --  32  --   --   LABPROT  --   --   --  13.9  --   --   INR  --   --   --  1.1  --   --   HEPARINUNFRC  --   --   --   --  0.83*  --   CREATININE 1.22* 1.10*  --   --   --  1.16*  TROPONINIHS 45*  --  48*  --   --   --     Estimated Creatinine Clearance: 36.9 mL/min (A) (by C-G formula based on SCr of 1.16 mg/dL (H)).   Assessment: 85 yr old female with new atrial fibrillation with RVR. Medical hx includes: abdominal pain/hx of ileus, HTN, hx pericarditis, significant atherosclerosis on past CTs.  CBC WNL  8/14 AM update: heparin level elevated, no issues per RN.   Goal of Therapy:  Heparin level 0.3-0.7 units/ml Monitor platelets by anticoagulation protocol: Yes   Plan:  Dec heparin to 950 units/hr Re-check heparin level in 8 hours  Narda Bonds, PharmD, [place] Pharmacist Phone: [phone]

## 2018-09-18 NOTE — Progress Notes (Signed)
Echocardiogram 2D Echocardiogram has been performed.  Oneal Deputy Leta Bucklin 09/18/2018, 2:29 PM

## 2018-09-18 NOTE — Progress Notes (Addendum)
Subjective:   [name] is doing well this AM. She does not care for the grits and would like some butter and salt. She is still having some right shoulder pain that radiates to her bilateral upper back. No changes in pain overnight. Pain is alleviated with warm pads. She continues to have [facility]. She denies orthopnea. Discussed her abnormal heart rhythm. We are awaiting an echocardiogram to further assess and determine which medications would be the best. She voices understanding. All questions and concerns addressed.    Last seen by Dr. Lia Foyer in 2013. While we do not have the results of her echo, his note mentions that she has a normal LVEF but mild AS.   Objective: Vital signs in last 24 hours: Vitals:   09/16/2018 1839 09/11/2018 1947 09/18/18 0218 09/18/18 0318  BP:   121/89 (!) 127/39  Pulse:  91 (!) 131 75  Resp:  17 (!) 23 (!) 31  Temp:  98.1 F (36.7 C) 98 F (36.7 C) 97.9 F (36.6 C)  TempSrc:  Oral Oral Oral  SpO2:  99% 92% 94%  Weight: 96.1 kg   95.7 kg  Height: 5\' 6"  (1.676 m)      Physical Exam Vitals signs and nursing note reviewed.  Constitutional:      Appearance: She is obese.  Cardiovascular:     Rate and Rhythm: Normal rate. Rhythm irregular.  No extrasystoles are present.    Heart sounds: Murmur (Blowing systolic murmur, 3/6) present.  Pulmonary:     Effort: Pulmonary effort is normal.     Breath sounds: Rales (bibasilar) present.  Abdominal:     Palpations: Abdomen is soft.     Tenderness: There is abdominal tenderness (Right flank. ). There is no guarding.  Musculoskeletal:     Right lower leg: She exhibits tenderness. Edema (1+ pitting, slightly worsened from previous exam) present.     Left lower leg: She exhibits tenderness. Edema (1+ pitting, slightly worsened from previous exam) present.  Skin:    General: Skin is warm and dry.     Comments: Numerous seborrheic keratosis.   Neurological:     General: No focal deficit present.     Mental Status:  She is alert.     Comments: Seems slightly more disoriented this morning. Could not remember our arrhythmia talk last night.   Psychiatric:        Mood and Affect: Mood normal.        Behavior: Behavior normal.    Assessment/Plan:  Active Problems:   Atrial fibrillation with RVR (HCC)  # Atrial Fibrillation with RVR New onset atrial fibrillation that presented with RVR but has improved since starting diltiazem drip. Chadsvas score of 6. Heparin was started on admission.   Unknown if A. fib was was provoked. Patient has no known history of heart failure. Last echo in 2013 showed normal EF and mild AS per cardiologist's note. Significant atherosclerosis on imaging.  Hx of pericarditis. Chest x-ray shows a possible pneumonia, but she continues to have no fevers or leukocytosis. O2 sats are in the 90s on room air during examination today.  Depending on TTE results, will need to change medications. If no valvular disorders, can switch to orals anticoagulation. Diltiazem will need to be switched out as well.   - Continue diltiazem drip - Telemetry  - TTE  - Heparin per pharmacy   # Abdominal pain # Hx of Ileus [name] has a history of abdominal obstruction and ileus in  2016.  She has noticed an increased abdominal bloating and pain in the past 1 month.  Her bowel movements have been regular but runny secondary to stool softener use.  KUB was negative for obstruction.  We will continue to monitor.   - Bowel regimen   # Hypertension Only home medication for blood pressure is amlodipine 5 mg daily.  - Amlodipine 5mg  QD   Dispo: Anticipated discharge pending medical improvement  [name] Internal Medicine PGY-1  Pager: [phone] 09/18/2018, 6:53 AM

## 2018-09-18 NOTE — Progress Notes (Signed)
  Echocardiogram 2D Echocardiogram has been performed.  Johny Chess 09/18/2018, 4:08 PM

## 2018-09-18 NOTE — Progress Notes (Signed)
Student Pharmacist rounding with the IMTS-B1 service observed the potential for medication optimization. The patient is a 85 year old lady on day 2 of hospital admission for new-onset atrial fibrillation with RVR. She is currently is managed with continuous infusion of diltiazem 5-15 mg/hour and intravenous heparin 950 units/hour.  I have reviewed the patient's profile and find listed her creatinine clearance 36.9 mL/min, serum creatinine 1.16 mg/dL, and weight at 95.7kg. At this time, she is currently scheduled for a transthoracic echocardiogram to further characterize her new onset atrial fibrillation. In the setting of non-valvular atrial fibrillation, consider the use of either Xarelto 15mg  once daily with the largest meal or Eliquis 5mg  twice daily. This recommendation is based on her renal function and CHEST guidelines. Continue to monitor for medication tolerance and symptoms of bleeding.  Youlanda Roys, 4th year Stage manager

## 2018-09-18 NOTE — Progress Notes (Signed)
[facility] for Heparin Indication: Atrial Fibrillation  Allergies  Allergen Reactions  . Diclofenac Sodium Anaphylaxis  . Ibuprofen Anaphylaxis  . Lisinopril     Fall hazard, dizzy  . Adhesive [Tape]   . Codeine Nausea Only  . Tizanidine     "knocked out cold"    Patient Measurements: Weight: 96.1 kg Height: 66 inches Heparin Dosing Weight: 80.7 kg  Vital Signs: Temp: 98.1 F (36.7 C) (08/14 0900) Temp Source: Oral (08/14 0900) BP: 111/79 (08/14 0900) Pulse Rate: 73 (08/14 0900)  Labs: Recent Labs    09/21/2018 1015 10/03/2018 1031 09/14/2018 1235 09/16/2018 1827 09/18/18 0350 09/18/18 0351 09/18/18 1134  HGB 13.6 14.6  --   --   --  12.0  --   HCT 42.5 43.0  --   --   --  37.2  --   PLT 259  --   --   --   --  242  --   APTT  --   --   --  32  --   --   --   LABPROT  --   --   --  13.9  --   --   --   INR  --   --   --  1.1  --   --   --   HEPARINUNFRC  --   --   --   --  0.83*  --  0.46  CREATININE 1.22* 1.10*  --   --   --  1.16*  --   TROPONINIHS 45*  --  48*  --   --   --   --     Estimated Creatinine Clearance: 36.9 mL/min (A) (by C-G formula based on SCr of 1.16 mg/dL (H)).   Assessment: 85 yr old female with new atrial fibrillation with RVR. Medical hx includes: abdominal pain/hx of ileus, HTN, hx pericarditis, significant atherosclerosis on past CTs. CBC WNL. No active bleed issues documented.  Heparin level now therapeutic at 0.46 after rate decrease.  Goal of Therapy:  Heparin level 0.3-0.7 units/ml Monitor platelets by anticoagulation protocol: Yes   Plan:  Continue heparin at 950 units/hr Monitor daily heparin level and CBC, s/sx bleeding F/u long-term AC plan  Elicia Lamp, PharmD, BCPS Please check AMION for all [facility] contact numbers Clinical Pharmacist 09/18/2018 12:23 PM

## 2018-09-19 ENCOUNTER — Inpatient Hospital Stay (HOSPITAL_COMMUNITY): Payer: Medicare Other

## 2018-09-19 DIAGNOSIS — I10 Essential (primary) hypertension: Secondary | ICD-10-CM

## 2018-09-19 DIAGNOSIS — K567 Ileus, unspecified: Secondary | ICD-10-CM

## 2018-09-19 DIAGNOSIS — R1013 Epigastric pain: Secondary | ICD-10-CM

## 2018-09-19 LAB — BASIC METABOLIC PANEL
Anion gap: 12 (ref 5–15)
BUN: 11 mg/dL (ref 8–23)
CO2: 25 mmol/L (ref 22–32)
Calcium: 9.1 mg/dL (ref 8.9–10.3)
Chloride: 99 mmol/L (ref 98–111)
Creatinine, Ser: 1.16 mg/dL — ABNORMAL HIGH (ref 0.44–1.00)
GFR calc Af Amer: 48 mL/min — ABNORMAL LOW (ref >60)
GFR calc non Af Amer: 41 mL/min — ABNORMAL LOW (ref >60)
Glucose, Bld: 119 mg/dL — ABNORMAL HIGH (ref 70–99)
Potassium: 3.9 mmol/L (ref 3.5–5.1)
Sodium: 136 mmol/L (ref 135–145)

## 2018-09-19 LAB — CBC
HCT: 38.3 % (ref 36.0–46.0)
Hemoglobin: 12.5 g/dL (ref 12.0–15.0)
MCH: 30.3 pg (ref 26.0–34.0)
MCHC: 32.6 g/dL (ref 30.0–36.0)
MCV: 92.7 fL (ref 80.0–100.0)
Platelets: 240 10*3/uL (ref 150–400)
RBC: 4.13 MIL/uL (ref 3.87–5.11)
RDW: 13 % (ref 11.5–15.5)
WBC: 9 10*3/uL (ref 4.0–10.5)
nRBC: 0 % (ref 0.0–0.2)

## 2018-09-19 LAB — MAGNESIUM: Magnesium: 2 mg/dL (ref 1.7–2.4)

## 2018-09-19 LAB — HEPARIN LEVEL (UNFRACTIONATED): Heparin Unfractionated: 0.47 IU/mL (ref 0.30–0.70)

## 2018-09-19 MED ORDER — POTASSIUM CHLORIDE 10 MEQ/100ML IV SOLN
10.0000 meq | INTRAVENOUS | Status: AC
  Administered 2018-09-19 (×3): 10 meq via INTRAVENOUS
  Filled 2018-09-19: qty 100

## 2018-09-19 MED ORDER — PANTOPRAZOLE SODIUM 40 MG IV SOLR
40.0000 mg | INTRAVENOUS | Status: DC
  Administered 2018-09-19 – 2018-09-28 (×10): 40 mg via INTRAVENOUS
  Filled 2018-09-19 (×10): qty 40

## 2018-09-19 MED ORDER — METOPROLOL TARTRATE 25 MG PO TABS: 25.0000 mg | ORAL_TABLET | Freq: Two times a day (BID) | ORAL | Status: DC

## 2018-09-19 NOTE — Progress Notes (Signed)
1940: during bed side report pt seemed in distress. HR 140's, o2 sat 97% in 3l o2 but pt complained of tightness of chest and abdomen. Cardizem drip titrated to 15mg /hr per day shift RN. Pt's lung sounds were congested. vomited x1. When I entered the room, pt was coughing up. HOB elevated. I called respiratory for breathing treatment. Pt's son in the room was very concerned and worried.  2005: paged on call IM resident DR. Alaxander.  2010: DR. Gwenyth Ober called back. Updated abt pt's condition. Pt HR was aleary in 70-110. Notified MD son is very worried and wants to talk to MD. HE said he will come by.  2030: Dr. Gwenyth Ober by bedside. Pt has already received breathing treatment and said coughed out couple of mucus plugs. Now resting in bed without any distress, HR 100's. Vitals stable. Breathing even and unlabored in 3l 02 via [place].  Will continue to monitor.

## 2018-09-19 NOTE — Progress Notes (Signed)
[facility] for Heparin Indication: Atrial Fibrillation  Allergies  Allergen Reactions  . Diclofenac Sodium Anaphylaxis  . Ibuprofen Anaphylaxis  . Lisinopril     Fall hazard, dizzy  . Adhesive [Tape]   . Codeine Nausea Only  . Tizanidine     "knocked out cold"    Patient Measurements: Weight: 96.1 kg Height: 66 inches Heparin Dosing Weight: 80.7 kg  Vital Signs: Temp: 98.1 F (36.7 C) (08/15 0432) Temp Source: Oral (08/15 0432) BP: 146/90 (08/15 0432) Pulse Rate: 90 (08/15 0432)  Labs: Recent Labs    09/26/2018 1015 09/20/2018 1031 09/28/2018 1235 09/15/2018 1827 09/18/18 0350 09/18/18 0351 09/18/18 1134 09/19/18 0732  HGB 13.6 14.6  --   --   --  12.0  --  12.5  HCT 42.5 43.0  --   --   --  37.2  --  38.3  PLT 259  --   --   --   --  242  --  240  APTT  --   --   --  32  --   --   --   --   LABPROT  --   --   --  13.9  --   --   --   --   INR  --   --   --  1.1  --   --   --   --   HEPARINUNFRC  --   --   --   --  0.83*  --  0.46 0.47  CREATININE 1.22* 1.10*  --   --   --  1.16*  --  1.16*  TROPONINIHS 45*  --  48*  --   --   --   --   --     Estimated Creatinine Clearance: 36.7 mL/min (A) (by C-G formula based on SCr of 1.16 mg/dL (H)).   Assessment: 85 yr old female with new atrial fibrillation with RVR. Medical hx includes: abdominal pain/hx of ileus, HTN, hx pericarditis, significant atherosclerosis on past CTs.  CBC stable with Hgb 12.5, Hct 38.3, and plts 240. HL is therapeutic at 0.47. no signs of bleeding noted per chart.   Goal of Therapy:  Heparin level 0.3-0.7 units/ml Monitor platelets by anticoagulation protocol: Yes   Plan:  Continue heparin infusion at 950 units/hr Monitor daily CBC and heparin level  Monitor for s/sx of bleeding  Follow up for long term anticoagulation plan    Thank you,   Eddie Candle, PharmD PGY-1 Pharmacy Resident  Phone: [phone]

## 2018-09-19 NOTE — Progress Notes (Signed)
Progress Note  Patient Name: [name] Date of Encounter: 09/19/2018  Primary Cardiologist: Shelva Majestic, MD   Subjective   Denies chest pain. Has epigastric pain and nausea. Son present. Says she hasn't eaten since Wednesday. He lives with her, cooks for her, and takes care of her.  Abdominal xray results from this morning are pending.  Inpatient Medications    Scheduled Meds: . amLODipine  5 mg Oral Daily  . pantoprazole (PROTONIX) IV  40 mg Intravenous Q24H  . polyethylene glycol  17 g Oral BID  . sodium chloride flush  3 mL Intravenous Q12H   Continuous Infusions: . diltiazem (CARDIZEM) infusion 15 mg/hr (09/19/18 0723)  . heparin 950 Units/hr (09/18/18 1825)  . potassium chloride     PRN Meds: acetaminophen **OR** acetaminophen, levalbuterol, metoprolol tartrate, ondansetron **OR** ondansetron (ZOFRAN) IV, sodium chloride flush   Vital Signs    Vitals:   09/19/18 0040 09/19/18 0325 09/19/18 0326 09/19/18 0432  BP: 97/71   (!) 146/90  Pulse: (!) 101 66 76 90  Resp: (!) 24 15 (!) 22 (!) 21  Temp: 98 F (36.7 C)   98.1 F (36.7 C)  TempSrc: Oral   Oral  SpO2: 93% 91% 92% 92%  Weight:    94.8 kg  Height:        Intake/Output Summary (Last 24 hours) at 09/19/2018 1141 Last data filed at 09/19/2018 0956 Gross per 24 hour  Intake 0 ml  Output 450 ml  Net -450 ml   Filed Weights   09/10/2018 1839 09/18/18 0318 09/19/18 0432  Weight: 96.1 kg 95.7 kg 94.8 kg    Telemetry    Atrial fib and periodic atrial flutter with probable 2:1 conduction, current HR 80-low 100's - Personally Reviewed  ECG    NA - Personally Reviewed  Physical Exam   GEN: Elderly, frail, chronically ill appearing.  Neck: No JVD Cardiac: irregular, 2/6 ESM at RUSB, no rubs, or gallops.  Respiratory: Clear to auscultation bilaterally. GI: Soft, mild epigastric tenderness, mildly distended  MS: No edema; No deformity. Neuro:  Nonfocal  Psych: Anxious affect   Labs    Chemistry  Recent Labs  Lab 09/18/2018 1015 09/16/2018 1031 09/18/18 0351 09/19/18 0732  NA 136 134* 135 136  K 4.3 4.1 3.9 3.9  CL 102 103 100 99  CO2 21*  --  24 25  GLUCOSE 131* 128* 159* 119*  BUN 7* 8 9 11   CREATININE 1.22* 1.10* 1.16* 1.16*  CALCIUM 9.0  --  8.7* 9.1  PROT 6.7  --   --   --   ALBUMIN 3.5  --   --   --   AST 28  --   --   --   ALT 28  --   --   --   ALKPHOS 53  --   --   --   BILITOT 0.8  --   --   --   GFRNONAA 39*  --  41* 41*  GFRAA 45*  --  48* 48*  ANIONGAP 13  --  11 12     Hematology Recent Labs  Lab 09/19/2018 1015 09/08/2018 1031 09/18/18 0351 09/19/18 0732  WBC 8.8  --  7.1 9.0  RBC 4.51  --  3.96 4.13  HGB 13.6 14.6 12.0 12.5  HCT 42.5 43.0 37.2 38.3  MCV 94.2  --  93.9 92.7  MCH 30.2  --  30.3 30.3  MCHC 32.0  --  32.3 32.6  RDW 12.9  --  13.0 13.0  PLT 259  --  242 240    Cardiac EnzymesNo results for input(s): TROPONINI in the last 168 hours. No results for input(s): TROPIPOC in the last 168 hours.   BNP Recent Labs  Lab 09/20/2018 1015  BNP 418.6*     DDimer No results for input(s): DDIMER in the last 168 hours.   Radiology    Dg Shoulder Right Portable  Result Date: 09/13/2018 CLINICAL DATA:  Right shoulder pain.  Shortness of breath. EXAM: PORTABLE RIGHT SHOULDER COMPARISON:  Portable chest 09/21/2018. FINDINGS: Diffuse osteopenia. Acromioclavicular and glenohumeral degenerative change. Calcification noted over the supraspinatus tendon. No evidence of acute abnormality. No evidence of fracture, dislocation, or separation. IMPRESSION: 1. Diffuse osteopenia. Severe acromioclavicular and glenohumeral degenerative change. 2.  Calcific supraspinatus tendinosis. 3. No acute bony abnormality. No evidence of fracture, dislocation, or separation. Electronically Signed   By: Marcello Moores  Register   On: 10/04/2018 13:31   Dg Abd Portable 1 View  Result Date: 09/10/2018 CLINICAL DATA:  Possible blockage, history of ileus EXAM: PORTABLE ABDOMEN - 1 VIEW  COMPARISON:  Comparison radiograph 10/14/2014 FINDINGS: Few air-filled loops of bowel measuring up to 4.8 cm have a haustra pattern compatible with colon. Remaining bowel gas pattern is unremarkable without convincing evidence of obstruction. Air overlies the rectal vault. Multilevel degenerative changes are present in the imaged portions of the spine. Additional degenerative changes are present in the SI joints and both hips. Lung bases are clear. Calcified phleboliths are present the pelvis with additional arterial calcifications as well. IMPRESSION: Nonspecific bowel gas pattern without convincing evidence of obstruction. Electronically Signed   By: Lovena Le M.D.   On: 09/19/2018 14:57    Cardiac Studies   Echo 09/18/18:   1. The left ventricle has normal systolic function, with an ejection fraction of 55-60%. The cavity size was normal. There is moderately increased left ventricular wall thickness. Left ventricular diastolic function could not be evaluated secondary to  atrial fibrillation.  2. The right ventricle has normal systolic function. The cavity was normal. There is no increase in right ventricular wall thickness. Right ventricular systolic pressure could not be assessed.  3. The mitral valve is grossly normal.  4. The tricuspid valve is grossly normal.  5. The aortic valve was not well visualized. Moderate calcification of the aortic valve. Aortic valve regurgitation was not assessed by color flow Doppler.  6. Very poor visualization of the AV. Cannot assess number of cusps. There is thickening and calcificaiton of the AV. By doppler there is mild to moderate AS with man AVG 4mmHg and Vmax 276cm/s.  7. The aorta is normal unless otherwise noted.  8. The inferior vena cava was dilated in size with <50% respiratory variability.  9. The interatrial septum appears to be lipomatous. 10. Recommend repeat limited echo o f the AV.   Limited echo 09/18/18:   1. There is mild mitral  annular calcification present.  FINDINGS  Mitral Valve: There is mild mitral annular calcification present.  Aortic Valve: The aortic valve is tricuspid . Moderate thickening of the aortic valve. Severe calcifcation of the aortic valve. Aortic valve regurgitation was not visualized by color flow Doppler. There is Moderate stenosis of the aortic valve, with a  calculated valve area of 1.07 cm. Mild to moderate aortic annular calcification noted. AV Mean Grad: 22.0 mmHg. LVOT/AV VTI ratio: 0.34. AV Vmax: 298.00 cm/s.  Patient Profile     86 y.o. female with  a hx of HTN, ileus, mild aortic stenosis (2013) and pericarditis who is being seen today for the evaluation of atrial fibrillation with RVR at the request of Dr. Heber [place].  Assessment & Plan    1. Rapid atrial fibrillation: Currently on IV diltiazem 15 mg/hr and prn IV metoprolol. Also on IV heparin. HR's 80-low 100's. She is unable to tolerate oral meds. Consider TEE/DCCV next week if she does not convert to sinus on her own. LA normal in size. Continue present management. I think this is likely being driven by GI issues.  2. Aortic stenosis: Moderate in severity.  3. Abdominal pain: No signs of obstruction by xrays thus far. Today's results are pending. She has not eaten since Wednesday. On prn Zofran and IV Protonix 40 mg daily. May be worthwhile to get GI involved.  4. HTN: SBP in 130's currently.     For questions or updates, please contact [facility] Please consult [email or web address] for contact info under Cardiology/STEMI.      Signed, Kate Sable, MD  09/19/2018, 11:41 AM

## 2018-09-19 NOTE — Progress Notes (Signed)
  Date: 09/19/2018  Patient name: [name]  Medical record number: [id number]  Date of birth: [birth year]   This patient's plan of care was discussed with the house staff. Please see Dr. Jerrell Mylar note for complete details. I concur with his findings.   Sid Falcon, MD 09/19/2018, 8:30 PM

## 2018-09-19 NOTE — Progress Notes (Addendum)
Saw the patient at the bedside around 8:30 PM after receiving a call from the nurse saying the patient had increased respiratory distress, chest tightness and increased coughing. Son was present at the bedside. The patient appeared comfortable and was resting in bed. She had just received breathing treatements shortly before I saw her. She denied chest pain and breathing was much improved. Vital signs were stable. HR<100 and sats on 3 L of oxygen. The patient stated that she would like her son to be updated on any changes in her medical care. Reassurance was provided. We will continue to monitor.

## 2018-09-19 NOTE — Progress Notes (Addendum)
   Subjective: Patient feels she is dying this AM. Continues to have epigastric pain/burning with N/V. She is unable to tolerate PO intake. She is still passing flatus. She feels very SHOB. She doesn't think medications will help and thinks it may be her time. Discussed coordinating care with cardiology and switching some medications to IV. I have called her son Saralyn Pilar and discussed that plan. All questions and concerns addressed.   Objective: Vital signs in last 24 hours: Vitals:   09/19/18 0040 09/19/18 0325 09/19/18 0326 09/19/18 0432  BP: 97/71   (!) 146/90  Pulse: (!) 101 66 76 90  Resp: (!) 24 15 (!) 22 (!) 21  Temp: 98 F (36.7 C)   98.1 F (36.7 C)  TempSrc: Oral   Oral  SpO2: 93% 91% 92% 92%  Weight:    94.8 kg  Height:       General: Elderly female, in visible distress Pulm: Good air movement with no wheezing or crackles  CV: Tachycardic, irregular Abdomen: Hypoactive bowel sounds, epigastric tenderness to palpation   Assessment/Plan:  Deretha Ertle is a 85 y.o female with HTN and a pervious prolonged hospitalization for ileus who presented to the ED with 3 days of progressive dyspnea and chest pain. In the ED she was found to be in A-fib with RVR. She was started on a dilt ggt and admitted for further evaluation/management.   A-fib w/RVR - Continues to be very symptomatic with [facility] and chest discomfort (some may be 2/2 GERD)  - Echocardiogram with normal LVEF, no LAE/RAE, but moderate AS - Telemetry reviewed, appears to be primarily Afib but does seem to have some runs of A-flutter with 2:1 - Replace K to maintain >4.0 and Mg > 2.0  - She is on Diltiazem 15mg /hr with PRN metoprolol  - CHADsVAC 5-6 with HASBLED of 1. Currently on Heparin  - Her HR remains above goal. I have reached out to cardiology about starting Amiodarone. There is a risk of chemical cardioversion and since her symptoms have been going on for >48 hours the risk of embolic event is present. I have discussed  this with her son.   Epigastric Abdominal Pain  Prior hospitalization for Ileus  - Diet as tolerated  - PRN zofran for nausea  - IV protonix 40 mg QD  - Repeat abdominal film to assess for ileus   Hypertension - Currently on Amlodipine 5 mg  - This may need to be switched depending on which agent we select for control of the patient A-fib   Dispo: Anticipated discharge pending improvement in A-fib and abdominal pain.   Ina Homes, MD 09/19/2018, 9:10 AM Pager: [phone]

## 2018-09-20 ENCOUNTER — Inpatient Hospital Stay (HOSPITAL_COMMUNITY): Payer: Medicare Other

## 2018-09-20 DIAGNOSIS — R142 Eructation: Secondary | ICD-10-CM

## 2018-09-20 LAB — CBC WITH DIFFERENTIAL/PLATELET
Abs Immature Granulocytes: 0.05 10*3/uL (ref 0.00–0.07)
Basophils Absolute: 0 10*3/uL (ref 0.0–0.1)
Basophils Relative: 0 %
Eosinophils Absolute: 0 10*3/uL (ref 0.0–0.5)
Eosinophils Relative: 0 %
HCT: 37.6 % (ref 36.0–46.0)
Hemoglobin: 12.3 g/dL (ref 12.0–15.0)
Immature Granulocytes: 1 %
Lymphocytes Relative: 4 %
Lymphs Abs: 0.4 10*3/uL — ABNORMAL LOW (ref 0.7–4.0)
MCH: 30.3 pg (ref 26.0–34.0)
MCHC: 32.7 g/dL (ref 30.0–36.0)
MCV: 92.6 fL (ref 80.0–100.0)
Monocytes Absolute: 0.5 10*3/uL (ref 0.1–1.0)
Monocytes Relative: 5 %
Neutro Abs: 9.8 10*3/uL — ABNORMAL HIGH (ref 1.7–7.7)
Neutrophils Relative %: 90 %
Platelets: 219 10*3/uL (ref 150–400)
RBC: 4.06 MIL/uL (ref 3.87–5.11)
RDW: 12.9 % (ref 11.5–15.5)
WBC: 10.8 10*3/uL — ABNORMAL HIGH (ref 4.0–10.5)
nRBC: 0 % (ref 0.0–0.2)

## 2018-09-20 LAB — CBC
HCT: 38.1 % (ref 36.0–46.0)
Hemoglobin: 12 g/dL (ref 12.0–15.0)
MCH: 29.9 pg (ref 26.0–34.0)
MCHC: 31.5 g/dL (ref 30.0–36.0)
MCV: 95 fL (ref 80.0–100.0)
Platelets: 238 10*3/uL (ref 150–400)
RBC: 4.01 MIL/uL (ref 3.87–5.11)
RDW: 13 % (ref 11.5–15.5)
WBC: 8.4 10*3/uL (ref 4.0–10.5)
nRBC: 0 % (ref 0.0–0.2)

## 2018-09-20 LAB — BASIC METABOLIC PANEL
Anion gap: 9 (ref 5–15)
BUN: 15 mg/dL (ref 8–23)
CO2: 25 mmol/L (ref 22–32)
Calcium: 8.8 mg/dL — ABNORMAL LOW (ref 8.9–10.3)
Chloride: 97 mmol/L — ABNORMAL LOW (ref 98–111)
Creatinine, Ser: 1.24 mg/dL — ABNORMAL HIGH (ref 0.44–1.00)
GFR calc Af Amer: 44 mL/min — ABNORMAL LOW (ref >60)
GFR calc non Af Amer: 38 mL/min — ABNORMAL LOW (ref >60)
Glucose, Bld: 122 mg/dL — ABNORMAL HIGH (ref 70–99)
Potassium: 4.2 mmol/L (ref 3.5–5.1)
Sodium: 131 mmol/L — ABNORMAL LOW (ref 135–145)

## 2018-09-20 LAB — HEPARIN LEVEL (UNFRACTIONATED)
Heparin Unfractionated: 0.21 IU/mL — ABNORMAL LOW (ref 0.30–0.70)
Heparin Unfractionated: 0.22 IU/mL — ABNORMAL LOW (ref 0.30–0.70)

## 2018-09-20 LAB — MAGNESIUM: Magnesium: 2 mg/dL (ref 1.7–2.4)

## 2018-09-20 MED ORDER — DEXTROSE 5 % IV SOLN
250.0000 mg | Freq: Every day | INTRAVENOUS | Status: DC
  Administered 2018-09-21: 23:00:00 250 mg via INTRAVENOUS
  Filled 2018-09-20 (×2): qty 250

## 2018-09-20 MED ORDER — FUROSEMIDE 10 MG/ML IJ SOLN
40.0000 mg | Freq: Once | INTRAMUSCULAR | Status: AC
  Administered 2018-09-20: 40 mg via INTRAVENOUS
  Filled 2018-09-20: qty 4

## 2018-09-20 MED ORDER — HEPARIN BOLUS VIA INFUSION
1200.0000 [IU] | Freq: Once | INTRAVENOUS | Status: AC
  Administered 2018-09-20: 1200 [IU] via INTRAVENOUS
  Filled 2018-09-20: qty 1200

## 2018-09-20 MED ORDER — LEVALBUTEROL HCL 0.63 MG/3ML IN NEBU
0.6300 mg | INHALATION_SOLUTION | Freq: Four times a day (QID) | RESPIRATORY_TRACT | Status: DC | PRN
  Administered 2018-09-20 – 2018-09-30 (×12): 0.63 mg via RESPIRATORY_TRACT
  Filled 2018-09-20 (×12): qty 3

## 2018-09-20 MED ORDER — AZITHROMYCIN 500 MG PO TABS
500.0000 mg | ORAL_TABLET | Freq: Once | ORAL | Status: DC
  Filled 2018-09-20: qty 1

## 2018-09-20 MED ORDER — FUROSEMIDE 10 MG/ML IJ SOLN
20.0000 mg | Freq: Once | INTRAMUSCULAR | Status: AC
  Administered 2018-09-20: 20 mg via INTRAVENOUS
  Filled 2018-09-20: qty 2

## 2018-09-20 MED ORDER — SODIUM CHLORIDE 0.9 % IV SOLN
1.0000 g | Freq: Every day | INTRAVENOUS | Status: AC
  Administered 2018-09-21 – 2018-09-24 (×5): 1 g via INTRAVENOUS
  Filled 2018-09-20 (×5): qty 1

## 2018-09-20 MED ORDER — METHYLPREDNISOLONE SODIUM SUCC 125 MG IJ SOLR
125.0000 mg | Freq: Once | INTRAMUSCULAR | Status: AC
  Administered 2018-09-20: 125 mg via INTRAVENOUS
  Filled 2018-09-20: qty 2

## 2018-09-20 MED ORDER — AZITHROMYCIN 250 MG PO TABS: 250.0000 mg | ORAL_TABLET | Freq: Every day | ORAL | Status: DC

## 2018-09-20 MED ORDER — SODIUM CHLORIDE 0.9 % IV SOLN
500.0000 mg | Freq: Once | INTRAVENOUS | Status: AC
  Administered 2018-09-21: 500 mg via INTRAVENOUS
  Filled 2018-09-20: qty 500

## 2018-09-20 NOTE — Plan of Care (Signed)
Poc progressing.  

## 2018-09-20 NOTE — Progress Notes (Addendum)
6924: text paged on call Im resident DR. Alexandr 0050:MD called back. notified central tele has called several times with ST elevation alarm of 1.8 to 2.1 . 12 lead ekg done and in epic to review. Notified pt is sleeping without any discomfort and vitals stable. I told him HR is back and froth between 120-130's in 15 mg/hr Cardizem drip and I am fixing to give prn lopressor. No new orders at this time. Pt's HR came down to 70-90's after lopressor administered. Will continue to monitor.

## 2018-09-20 NOTE — Progress Notes (Signed)
Pt's temperature 101.7 axillary, recheck after 20 min was 101.3. pt on Bi-pap. Last tylenol was given at 1616 today. Unable to give more tylenol at the moment.  2034: text paged on call IM resident MD Darrick Meigs. Waiting on orders.

## 2018-09-20 NOTE — Progress Notes (Signed)
  Date: 09/20/2018  Patient name: [name]  Medical record number: [id number]  Date of birth: October 24, 1924   I have seen and evaluated this patient and I have discussed the plan of care with the house staff. Please see Dr. Jerrell Mylar note for complete details. I concur with his findings and plan.  Patient is very uncomfortable today with abdominal distention, little to no bowel sounds.  She is passing gas, which is a good sign.  Would recommend repeat AXR in the AM; agree with NGT for symptom relief and decompression from above.     Sid Falcon, MD 09/20/2018, 12:18 PM

## 2018-09-20 NOTE — Progress Notes (Signed)
Progress Note  Patient Name: [name] Date of Encounter: 09/20/2018  Primary Cardiologist: Shelva Majestic, MD   Subjective   Continues to have some abdominal pain and nausea. Abdominal xray from 8/15 suggestive of ileus.  Inpatient Medications    Scheduled Meds: . amLODipine  5 mg Oral Daily  . pantoprazole (PROTONIX) IV  40 mg Intravenous Q24H  . polyethylene glycol  17 g Oral BID  . sodium chloride flush  3 mL Intravenous Q12H   Continuous Infusions: . diltiazem (CARDIZEM) infusion 15 mg/hr (09/20/18 0537)  . heparin 950 Units/hr (09/19/18 2141)   PRN Meds: acetaminophen **OR** acetaminophen, levalbuterol, metoprolol tartrate, ondansetron **OR** ondansetron (ZOFRAN) IV, sodium chloride flush   Vital Signs    Vitals:   09/20/18 0016 09/20/18 0350 09/20/18 0506 09/20/18 0839  BP: 115/75 124/60  (!) 136/107  Pulse: 81 98  74  Resp: (!) 23 (!) 22  (!) 23  Temp: 98.4 F (36.9 C) 97.9 F (36.6 C)  97.6 F (36.4 C)  TempSrc: Oral Oral  Oral  SpO2: 96% 90% 94% 92%  Weight:  95.5 kg    Height:        Intake/Output Summary (Last 24 hours) at 09/20/2018 0942 Last data filed at 09/20/2018 0941 Gross per 24 hour  Intake 248.47 ml  Output 200 ml  Net 48.47 ml   Filed Weights   09/18/18 0318 09/19/18 0432 09/20/18 0350  Weight: 95.7 kg 94.8 kg 95.5 kg    Telemetry    Atrial fibrillation, 80-low 100 bpm range, isolated PVC's- Personally Reviewed  ECG    NA - Personally Reviewed  Physical Exam   GEN: No acute distress. Elderly, frail, chronically ill appearing.  Neck: No JVD Cardiac: Irregular, 2/6 ESM at RUSB, no rubs, or gallops.  Respiratory: Scattered wheezing. GI: Soft, mild epigastric tenderness, mildly distended  MS: No edema; No deformity. Neuro:  Nonfocal  Psych: Normal affect   Labs    Chemistry Recent Labs  Lab 10/05/2018 1015  09/18/18 0351 09/19/18 0732 09/20/18 0534  NA 136   < > 135 136 131*  K 4.3   < > 3.9 3.9 4.2  CL 102   < >  100 99 97*  CO2 21*  --  24 25 25   GLUCOSE 131*   < > 159* 119* 122*  BUN 7*   < > 9 11 15   CREATININE 1.22*   < > 1.16* 1.16* 1.24*  CALCIUM 9.0  --  8.7* 9.1 8.8*  PROT 6.7  --   --   --   --   ALBUMIN 3.5  --   --   --   --   AST 28  --   --   --   --   ALT 28  --   --   --   --   ALKPHOS 53  --   --   --   --   BILITOT 0.8  --   --   --   --   GFRNONAA 39*  --  41* 41* 38*  GFRAA 45*  --  48* 48* 44*  ANIONGAP 13  --  11 12 9    < > = values in this interval not displayed.     Hematology Recent Labs  Lab 09/18/18 0351 09/19/18 0732 09/20/18 0534  WBC 7.1 9.0 8.4  RBC 3.96 4.13 4.01  HGB 12.0 12.5 12.0  HCT 37.2 38.3 38.1  MCV 93.9 92.7 95.0  MCH  30.3 30.3 29.9  MCHC 32.3 32.6 31.5  RDW 13.0 13.0 13.0  PLT 242 240 238    Cardiac EnzymesNo results for input(s): TROPONINI in the last 168 hours. No results for input(s): TROPIPOC in the last 168 hours.   BNP Recent Labs  Lab 09/28/2018 1015  BNP 418.6*     DDimer No results for input(s): DDIMER in the last 168 hours.   Radiology    Dg Abd 1 View  Result Date: 09/19/2018 CLINICAL DATA:  Ileus. EXAM: ABDOMEN - 1 VIEW COMPARISON:  09/28/2018. FINDINGS: Mild gaseous distention of stomach and colon. Gas is seen in the rectum. Left pleural effusion is partially imaged. IMPRESSION: 1. Mild persistent gaseous distension of the colon may be due to an ileus. 2. Left pleural effusion is partially imaged. Electronically Signed   By: Lorin Picket M.D.   On: 09/19/2018 11:59    Cardiac Studies   Echo 09/18/18:  1. The left ventricle has normal systolic function, with an ejection fraction of 55-60%. The cavity size was normal. There is moderately increased left ventricular wall thickness. Left ventricular diastolic function could not be evaluated secondary to  atrial fibrillation. 2. The right ventricle has normal systolic function. The cavity was normal. There is no increase in right ventricular wall thickness. Right  ventricular systolic pressure could not be assessed. 3. The mitral valve is grossly normal. 4. The tricuspid valve is grossly normal. 5. The aortic valve was not well visualized. Moderate calcification of the aortic valve. Aortic valve regurgitation was not assessed by color flow Doppler. 6. Very poor visualization of the AV. Cannot assess number of cusps. There is thickening and calcificaiton of the AV. By doppler there is mild to moderate AS with man AVG 63mmHg and Vmax 276cm/s. 7. The aorta is normal unless otherwise noted. 8. The inferior vena cava was dilated in size with <50% respiratory variability. 9. The interatrial septum appears to be lipomatous. 10. Recommend repeat limited echo o f the AV.   Limited echo 09/18/18:  1. There is mild mitral annular calcification present.  FINDINGS  Mitral Valve: There is mild mitral annular calcification present.  Aortic Valve: The aortic valve is tricuspid . Moderate thickening of the aortic valve. Severe calcifcation of the aortic valve. Aortic valve regurgitation was not visualized by color flow Doppler. There is Moderate stenosis of the aortic valve, with a  calculated valve area of 1.07 cm. Mild to moderate aortic annular calcification noted. AV Mean Grad: 22.0 mmHg. LVOT/AV VTI ratio: 0.34. AV Vmax: 298.00 cm/s.  Patient Profile     85 y.o. female with a hx of HTN, ileus, mild aortic stenosis (2013) and pericarditiswho is being seen today for the evaluation of atrial fibrillation with RVRat the request of Dr. Heber [place].  Assessment & Plan    1. Rapid atrial fibrillation: Currently on IV diltiazem 15 mg/hr and prn IV metoprolol. Also on IV heparin. HR's 80-low 100's. She is unable to tolerate oral meds. Consider TEE/DCCV next week if she does not convert to sinus on her own. LA normal in size. Continue present management. I think this is likely being driven by ileus and abdominal pain.  2. Aortic stenosis: Moderate in  severity.  3. Abdominal pain: Xray suggestive of ileus on 8/15. On prn Zofran and IV Protonix 40 mg daily. May be worthwhile to get GI involved.  4. HTN: SBP in 130's currently.  5. Left pleural effusion: Has some shortness of breath. I will order one dose of  IV Lasix 20 mg. Monitor renal function.   For questions or updates, please contact [place] Please consult [email or web address] for contact info under Cardiology/STEMI.      Signed, Kate Sable, MD  09/20/2018, 9:42 AM

## 2018-09-20 NOTE — Progress Notes (Signed)
[facility] for Heparin Indication: Atrial Fibrillation  Allergies  Allergen Reactions  . Diclofenac Sodium Anaphylaxis  . Ibuprofen Anaphylaxis  . Lisinopril     Fall hazard, dizzy  . Adhesive [Tape]   . Codeine Nausea Only  . Tizanidine     "knocked out cold"    Patient Measurements: Weight: 96.1 kg Height: 66 inches Heparin Dosing Weight: 80.7 kg  Vital Signs: Temp: 98.5 F (36.9 C) (08/16 1558) Temp Source: Axillary (08/16 1558) BP: 98/80 (08/16 1747) Pulse Rate: 108 (08/16 1747)  Labs: Recent Labs    09/18/18 0351  09/19/18 0732 09/20/18 0534 09/20/18 1841  HGB 12.0  --  12.5 12.0  --   HCT 37.2  --  38.3 38.1  --   PLT 242  --  240 238  --   HEPARINUNFRC  --    < > 0.47 0.21* 0.22*  CREATININE 1.16*  --  1.16* 1.24*  --    < > = values in this interval not displayed.    Estimated Creatinine Clearance: 34.4 mL/min (A) (by C-G formula based on SCr of 1.24 mg/dL (H)).   Assessment: 85 yr old female with new atrial fibrillation with RVR. Medical hx includes: abdominal pain/hx of ileus, HTN, hx pericarditis, significant atherosclerosis on past CTs.  Heparin remains subtherapeutic despite bolus this morning. No current bleeding or infusion issues per nursing.   Goal of Therapy:  Heparin level 0.3-0.7 units/ml Monitor platelets by anticoagulation protocol: Yes   Plan:  -Increase heparin to 1150 units/hr -Recheck heparin level with morning labs   Arrie Senate, PharmD, BCPS Clinical Pharmacist [phone] Please check AMION for all [facility] numbers 09/20/2018

## 2018-09-20 NOTE — Progress Notes (Addendum)
Called to bedside to assess patient's worsening dyspnea.  Upon entering the room pt is just finishing a breathing treatment she is in distress, tachypneic,with diffuse wheezing decreased air movement, accessory muscle usage.  She is in afib w/RVR rate about 110-120 with some excess volume on exam but not overtly overloaded. Discussed with the son and nursing staff she has been having some trouble breathing but never to this extent and this represents an acute severe worsening.  The son is her healthcare power of attorney, he requests any and all therapies available to include intubation if necessary.  Based on her exam this appears to be primarily a pulmonary issue.    -IV solumedrol 125mg  x1  -continue nebulizer therapy -IV lasix 40mg  -BIPAP -STAT chest x-ray -Consulted PCCM they recommend continuing with these interventions and calling back if pt decompensates further

## 2018-09-20 NOTE — Progress Notes (Signed)
PR tylenol administered for fever/ pt tolerated very poorly became agitated, started moaning and yelling. Reassured pt that she is safe. On bi-pap. Will continue to monitor.

## 2018-09-20 NOTE — Progress Notes (Addendum)
[facility] for Heparin Indication: Atrial Fibrillation  Allergies  Allergen Reactions  . Diclofenac Sodium Anaphylaxis  . Ibuprofen Anaphylaxis  . Lisinopril     Fall hazard, dizzy  . Adhesive [Tape]   . Codeine Nausea Only  . Tizanidine     "knocked out cold"    Patient Measurements: Weight: 96.1 kg Height: 66 inches Heparin Dosing Weight: 80.7 kg  Vital Signs: Temp: 97.6 F (36.4 C) (08/16 0839) Temp Source: Oral (08/16 0839) BP: 136/107 (08/16 0839) Pulse Rate: 74 (08/16 0839)  Labs: Recent Labs    09/12/2018 1015  09/11/2018 1235 09/22/2018 1827  09/18/18 0351 09/18/18 1134 09/19/18 0732 09/20/18 0534  HGB 13.6   < >  --   --   --  12.0  --  12.5 12.0  HCT 42.5   < >  --   --   --  37.2  --  38.3 38.1  PLT 259  --   --   --   --  242  --  240 238  APTT  --   --   --  32  --   --   --   --   --   LABPROT  --   --   --  13.9  --   --   --   --   --   INR  --   --   --  1.1  --   --   --   --   --   HEPARINUNFRC  --   --   --   --    < >  --  0.46 0.47 0.21*  CREATININE 1.22*   < >  --   --   --  1.16*  --  1.16* 1.24*  TROPONINIHS 45*  --  48*  --   --   --   --   --   --    < > = values in this interval not displayed.    Estimated Creatinine Clearance: 34.4 mL/min (A) (by C-G formula based on SCr of 1.24 mg/dL (H)).   Assessment: 85 yr old female with new atrial fibrillation with RVR. Medical hx includes: abdominal pain/hx of ileus, HTN, hx pericarditis, significant atherosclerosis on past CTs.  CBC stable with Hgb 12.0, Hct 38.1, and plts 238. HL is sub-therapeutic at 0.21, sub-therapeutic level attributed to unknown time durring the night that heparin infusion was not running, due to loss of IV access and placement of a new line. Will continue heparin at the rate that patient was previously therapeutic on for 2 days. No bleeding noted.    Goal of Therapy:  Heparin level 0.3-0.7 units/ml Monitor platelets by  anticoagulation protocol: Yes   Plan:  Bolus heparin of 1200 units Continue heparin infusion at 950 units/hr Check a confirmatory heparin level in 8 hours.  Monitor daily CBC and heparin level  Monitor for s/sx of bleeding  Follow up for long term anticoagulation plan    Thank you,   Eddie Candle, PharmD PGY-1 Pharmacy Resident  Phone: [phone]

## 2018-09-20 NOTE — Progress Notes (Signed)
Placed pt on BIPAP per MD order. Pt had SOB prior to BIPAP and seems to be relaxing on BIPAP. RT will continue to monitor.

## 2018-09-20 NOTE — Progress Notes (Signed)
Pt's son Saralyn Pilar  called , updated on pt's conditions. Answered all his questions and concerns. Will continue to monitor.

## 2018-09-20 NOTE — Progress Notes (Signed)
   Subjective: Patient is having some SHOB this AM. She continues to have frequent belching. She had a small BM yesterday and is passing gas. She is having epigastric pain. Son is at bedside and reiterates that he wants everything done. Discussed placing an NG tube for her ileus and continuing to coordinate care with cardiology. Patient and her son voice understanding.   Objective: Vital signs in last 24 hours: Vitals:   09/20/18 0016 09/20/18 0350 09/20/18 0506 09/20/18 0839  BP: 115/75 124/60  (!) 136/107  Pulse: 81 98  74  Resp: (!) 23 (!) 22  (!) 23  Temp: 98.4 F (36.9 C) 97.9 F (36.6 C)  97.6 F (36.4 C)  TempSrc: Oral Oral  Oral  SpO2: 96% 90% 94% 92%  Weight:  95.5 kg    Height:       General: Elderly female, in visible distress Pulm: Good air movement with no wheezing or crackles  CV: Tachycardic, irregular Abdomen: Absent bowel sounds, distended, epigastric tenderness to palpation   Assessment/Plan:  [name] is a 85 y.o female with HTN and a pervious prolonged hospitalization for ileus who presented to the ED with 3 days of progressive dyspnea and chest pain. In the ED she was found to be in A-fib with RVR. She was started on a dilt ggt and admitted for further evaluation/management.   A-fib w/RVR - Continues to be very symptomatic with [facility] and chest discomfort (some may be 2/2 GERD)  - Echocardiogram with normal LVEF, no LAE/RAE, but moderate AS - Telemetry reviewed, appears to be primarily Afib but does seem to have some runs of A-flutter with 2:1 - Replace K to maintain >4.0 and Mg > 2.0  - She is on Diltiazem 15mg /hr with PRN metoprolol  - CHADsVAC 5-6 with HASBLED of 1. Currently on Heparin  - Appreciate cardiology recs.   Ileus  - Very symptomatic this AM  - Place NG tube to continuous low suction - Maintain K >4.0   - PRN zofran for nausea  - IV protonix 40 mg QD   Hypertension - Continue on Amlodipine 5 mg   Dispo: Anticipated discharge pending  improvement in A-fib and abdominal pain.   Ina Homes, MD 09/20/2018, 11:24 AM Pager: [phone]

## 2018-09-20 NOTE — Progress Notes (Signed)
DR. Maricela Bo from [facility] called back. Updated on pt. Received order for some labs. Will continue to monitor.

## 2018-09-20 NOTE — Progress Notes (Signed)
Dr. Maricela Bo and DR. Christian by bedside to access pt. Notified MD pt might not be able to tolerate po meds as she has been on bi-pap since 1800 today. MD stated she will change PO antibiotics to IV. Lab called for stat blood cultures. Pt resting in bed, moans and groans with activity. Will continue to monitor.

## 2018-09-20 NOTE — Progress Notes (Signed)
Paged this evening regarding fever--Tmax 101.7. Patient has been experiencing progressive respiratory distress and is currently on BiPAP. On 3L at baseline during hospitalization. Chart reviewed. AM labs without leukocytosis. CXR from this afternoon did indicate interval mild right basilar atelectasis or pneumonia and probable pleural fluid.  Patient assessed at bedside by myself and Dr. Maricela Bo. Patient was very drowsy and unable to contribute as a historian. Bibasilar crackles appreciated with diffuse wheezes throughout. JVD/peripheral edema not appreciated. Repeat CBC did reveal leukocytosis--10.3 without left shift. BC/UA/UC pending. Plan: Given the new onset leukocytosis in addition to declining respiratory status, will treat empirically with azithromycin and rocephin. Will continue to monitor.

## 2018-09-21 DIAGNOSIS — J189 Pneumonia, unspecified organism: Secondary | ICD-10-CM

## 2018-09-21 DIAGNOSIS — J9691 Respiratory failure, unspecified with hypoxia: Secondary | ICD-10-CM

## 2018-09-21 LAB — BASIC METABOLIC PANEL
Anion gap: 13 (ref 5–15)
BUN: 19 mg/dL (ref 8–23)
CO2: 24 mmol/L (ref 22–32)
Calcium: 8.8 mg/dL — ABNORMAL LOW (ref 8.9–10.3)
Chloride: 96 mmol/L — ABNORMAL LOW (ref 98–111)
Creatinine, Ser: 1.4 mg/dL — ABNORMAL HIGH (ref 0.44–1.00)
GFR calc Af Amer: 38 mL/min — ABNORMAL LOW (ref >60)
GFR calc non Af Amer: 33 mL/min — ABNORMAL LOW (ref >60)
Glucose, Bld: 181 mg/dL — ABNORMAL HIGH (ref 70–99)
Potassium: 4.1 mmol/L (ref 3.5–5.1)
Sodium: 133 mmol/L — ABNORMAL LOW (ref 135–145)

## 2018-09-21 LAB — URINALYSIS, ROUTINE W REFLEX MICROSCOPIC
Bilirubin Urine: NEGATIVE
Glucose, UA: 50 mg/dL — AB
Hgb urine dipstick: NEGATIVE
Ketones, ur: NEGATIVE mg/dL
Leukocytes,Ua: NEGATIVE
Nitrite: NEGATIVE
Protein, ur: NEGATIVE mg/dL
Specific Gravity, Urine: 1.011 (ref 1.005–1.030)
pH: 5 (ref 5.0–8.0)

## 2018-09-21 LAB — CBC
HCT: 37.6 % (ref 36.0–46.0)
Hemoglobin: 12.1 g/dL (ref 12.0–15.0)
MCH: 30.5 pg (ref 26.0–34.0)
MCHC: 32.2 g/dL (ref 30.0–36.0)
MCV: 94.7 fL (ref 80.0–100.0)
Platelets: 219 10*3/uL (ref 150–400)
RBC: 3.97 MIL/uL (ref 3.87–5.11)
RDW: 13 % (ref 11.5–15.5)
WBC: 10.7 10*3/uL — ABNORMAL HIGH (ref 4.0–10.5)
nRBC: 0 % (ref 0.0–0.2)

## 2018-09-21 LAB — TROPONIN I (HIGH SENSITIVITY)
Troponin I (High Sensitivity): 14 ng/L (ref <18)
Troponin I (High Sensitivity): 11 ng/L (ref <18)
Troponin I (High Sensitivity): 13 ng/L (ref <18)

## 2018-09-21 LAB — HEPARIN LEVEL (UNFRACTIONATED): Heparin Unfractionated: 0.51 IU/mL (ref 0.30–0.70)

## 2018-09-21 LAB — MAGNESIUM: Magnesium: 2.1 mg/dL (ref 1.7–2.4)

## 2018-09-21 MED ORDER — FUROSEMIDE 10 MG/ML IJ SOLN
20.0000 mg | Freq: Once | INTRAMUSCULAR | Status: AC
  Administered 2018-09-21: 11:00:00 20 mg via INTRAVENOUS
  Filled 2018-09-21: qty 2

## 2018-09-21 NOTE — Progress Notes (Addendum)
Subjective:   Patient on BiPAP this morning, making conversation difficult.  She nodded her head to indicate her shortness of breath is about the same.  She continues to have abdominal pain.  Objective:  Vital signs in last 24 hours: Vitals:   09/21/18 0000 09/21/18 0327 09/21/18 0500 09/21/18 0645  BP: 109/72 123/79    Pulse: 81 (!) 140  63  Resp: 17 15  15   Temp: 99 F (37.2 C) 98 F (36.7 C)    TempSrc: Axillary Oral    SpO2: 97% 97%  100%  Weight:   95 kg 94.5 kg  Height:       Physical Exam Constitutional:      Appearance: She is obese.  Cardiovascular:     Rate and Rhythm: Normal rate. Rhythm irregular.     Heart sounds: No murmur. No gallop.   Pulmonary:     Effort: Respiratory distress (mild) present.     Breath sounds: Decreased breath sounds (bilaterally) present. No wheezing, rhonchi or rales.  Skin:    General: Skin is warm and dry.  Neurological:     General: No focal deficit present.     Mental Status: She is alert.    Assessment/Plan:  Principal Problem:   Atrial fibrillation with RVR (HCC) Active Problems:   Moderate aortic stenosis   Ileus (HCC)   Pleural effusion   Abdominal distention  #Atrial fibrillation with atrial flutter CHADsVAC 5-6 with HASBLED of 1. Echocardiogram on 8/14 with normal LVEF, no LAE/RAE, but moderate AS. Patient has not converted back to sinus rhythm. Today is day 4 of Diltiazem. Cardiology recommends continuing Diltiazem for now and is considering TEE/DCCV if she continues to not convert. Amiodarone may be a good option to switch to as some point as patient's kidney function is worsening with Diltiazem.   Acute EKG changes overnight that include ST elevation in lead II and avf.  Troponins were repeated and did not support an ischemic event.  Cardiology was made aware overnight.  Continue to monitor EKGs  - Cardiology on board and we appreciate the recommendations - Replace K to maintain >4.0 and Mg > 2.0  - Diltiazem  15mg /hr with PRN metoprolol  - Currently on Heparin   #Ileus  Patient has a history of ileus in the past that took a month to resolve.  KUB on presentation did not show an ileus however, patient developed severe nausea and vomiting.  Repeat KUB on 8/15 did show a possible ileus.  Due to severity of symptoms, NG tube placed.  On examination today, noted NG tube is no longer present.  Will consider replacement of NG tube if any acute worsening abdominal pain should occur.  - Maintain K >4.0   - PRN zofran for nausea  - IV protonix 40 mg QD   #Acute Respiratory Failure:  #Pneumonia:  Patient presented with SOB that was initially attributed to new onset Afib. Chest xray (8/16) shows stable left atelectasis or pneumonia with new right basilar atelectasis or pneumonia with pleural effusion and progressive bronchitic changes. Febrile episodes and worsening respiratory decompensation yesterday (8/16) required patient switch to Bipap. Antibiotics were started. Today, she was able to be weaned to 4L [place] and is currently tolerating that well. Due to recent severe emesis, possible this is aspiration pneumonia. Will repeat chest xray in 3 days to follow progression.  Blood and urine cultures were drawn and pending.  - Ceftriaxone + Azithromycin - Levalbuterol q6h PRN    #Hypertension -  Continue on Amlodipine 5 mg    Dispo: Anticipated discharge pending medical improvement.   Dr. Jose Persia Internal Medicine PGY-1  Pager: [phone] 09/21/2018, 6:46 AM

## 2018-09-21 NOTE — Progress Notes (Signed)
[facility] for Heparin Indication: Atrial Fibrillation  Allergies  Allergen Reactions  . Diclofenac Sodium Anaphylaxis  . Ibuprofen Anaphylaxis  . Lisinopril     Fall hazard, dizzy  . Adhesive [Tape]   . Codeine Nausea Only  . Tizanidine     "knocked out cold"    Patient Measurements: Weight: 96.1 kg Height: 66 inches Heparin Dosing Weight: 80.7 kg  Vital Signs: Temp: 97.7 F (36.5 C) (08/17 0752) Temp Source: Axillary (08/17 0752) BP: 111/66 (08/17 0822) Pulse Rate: 71 (08/17 0822)  Labs: Recent Labs    09/19/18 0732 09/20/18 0534 09/20/18 1841 09/20/18 2236 09/21/18 0407 09/21/18 0500 09/21/18 0613  HGB 12.5 12.0  --  12.3  --  12.1  --   HCT 38.3 38.1  --  37.6  --  37.6  --   PLT 240 238  --  219  --  219  --   HEPARINUNFRC 0.47 0.21* 0.22*  --   --   --  0.51  CREATININE 1.16* 1.24*  --   --  1.40*  --   --   TROPONINIHS  --   --   --   --  14  --   --     Estimated Creatinine Clearance: 30.3 mL/min (A) (by C-G formula based on SCr of 1.4 mg/dL (H)).   Assessment: 85 yr old female with new atrial fibrillation with RVR. Medical hx includes: abdominal pain/hx of ileus, HTN, hx pericarditis, significant atherosclerosis on past CTs.  CBC stable, no overt bleeding or complications noted.  Goal of Therapy:  Heparin level 0.3-0.7 units/ml Monitor platelets by anticoagulation protocol: Yes   Plan:  Continue IV heparin at 1150 units/hr. Monitor daily CBC and heparin level  Monitor for s/sx of bleeding  Follow up for long term anticoagulation plan    Marguerite Olea, [place] Pharmacist Phone [phone]  09/21/2018 9:20 AM

## 2018-09-21 NOTE — Progress Notes (Signed)
Progress Note  Patient Name: [name] Date of Encounter: 09/21/2018  Primary Cardiologist: Shelva Majestic, MD   Subjective   Pt with mild dyspnea this AM; no CP; was more dyspneic last PM and complained of chest and epigastric pain  Inpatient Medications    Scheduled Meds: . amLODipine  5 mg Oral Daily  . pantoprazole (PROTONIX) IV  40 mg Intravenous Q24H  . polyethylene glycol  17 g Oral BID  . sodium chloride flush  3 mL Intravenous Q12H   Continuous Infusions: . azithromycin    . cefTRIAXone (ROCEPHIN)  IV 1 g (09/21/18 0003)  . diltiazem (CARDIZEM) infusion 15 mg/hr (09/21/18 0820)  . heparin 1,150 Units/hr (09/21/18 0013)   PRN Meds: acetaminophen **OR** acetaminophen, levalbuterol, metoprolol tartrate, ondansetron **OR** ondansetron (ZOFRAN) IV, sodium chloride flush   Vital Signs    Vitals:   09/21/18 0500 09/21/18 0645 09/21/18 0752 09/21/18 0822  BP:   (!) 108/93 111/66  Pulse:  63 76 71  Resp:  15 16 19   Temp:   97.7 F (36.5 C)   TempSrc:   Axillary   SpO2:  100% 100% 98%  Weight: 95 kg 94.5 kg    Height:        Intake/Output Summary (Last 24 hours) at 09/21/2018 0857 Last data filed at 09/21/2018 0300 Gross per 24 hour  Intake 860.39 ml  Output 900 ml  Net -39.61 ml   Last 3 Weights 09/21/2018 09/21/2018 09/20/2018  Weight (lbs) 208 lb 5.4 oz 209 lb 7 oz 210 lb 8.6 oz  Weight (kg) 94.5 kg 95 kg 95.5 kg      Telemetry    Atrial flutter rate controlled- Personally Reviewed  ECG    Atrial flutter with slight inferolateral ST elevation- Personally Reviewed  Physical Exam   GEN: Elderly female on O2 Neck: Positive JVD Cardiac: irregular, 2/ 6 systolic murmur Respiratory: CTA anteriorly GI: Soft, diffusely tender to palpation MS: No edema Neuro:  Nonfocal  Psych: Normal affect   Labs    High Sensitivity Troponin:   Recent Labs  Lab 09/28/2018 1015 09/28/2018 1235 09/21/18 0407  TROPONINIHS 45* 48* 14      Chemistry Recent Labs   Lab 09/20/2018 1015  09/19/18 0732 09/20/18 0534 09/21/18 0407  NA 136   < > 136 131* 133*  K 4.3   < > 3.9 4.2 4.1  CL 102   < > 99 97* 96*  CO2 21*   < > 25 25 24   GLUCOSE 131*   < > 119* 122* 181*  BUN 7*   < > 11 15 19   CREATININE 1.22*   < > 1.16* 1.24* 1.40*  CALCIUM 9.0   < > 9.1 8.8* 8.8*  PROT 6.7  --   --   --   --   ALBUMIN 3.5  --   --   --   --   AST 28  --   --   --   --   ALT 28  --   --   --   --   ALKPHOS 53  --   --   --   --   BILITOT 0.8  --   --   --   --   GFRNONAA 39*   < > 41* 38* 33*  GFRAA 45*   < > 48* 44* 38*  ANIONGAP 13   < > 12 9 13    < > = values in this interval not  displayed.     Hematology Recent Labs  Lab 09/20/18 0534 09/20/18 2236 09/21/18 0500  WBC 8.4 10.8* 10.7*  RBC 4.01 4.06 3.97  HGB 12.0 12.3 12.1  HCT 38.1 37.6 37.6  MCV 95.0 92.6 94.7  MCH 29.9 30.3 30.5  MCHC 31.5 32.7 32.2  RDW 13.0 12.9 13.0  PLT 238 219 219    BNP Recent Labs  Lab 10/01/2018 1015  BNP 418.6*     Radiology    Dg Abd 1 View  Result Date: 09/19/2018 CLINICAL DATA:  Ileus. EXAM: ABDOMEN - 1 VIEW COMPARISON:  09/09/2018. FINDINGS: Mild gaseous distention of stomach and colon. Gas is seen in the rectum. Left pleural effusion is partially imaged. IMPRESSION: 1. Mild persistent gaseous distension of the colon may be due to an ileus. 2. Left pleural effusion is partially imaged. Electronically Signed   By: Lorin Picket M.D.   On: 09/19/2018 11:59   Dg Chest Port 1 View  Result Date: 09/20/2018 CLINICAL DATA:  Dyspnea. EXAM: PORTABLE CHEST 1 VIEW COMPARISON:  09/09/2018. FINDINGS: Stable enlarged cardiac silhouette. Mildly prominent interstitial markings with mild improvement. Normal vascularity. Stable patchy and linear density at the left lung base with interval ill-defined increased density at the right lung base, including pleural fluid. Minimal left pleural effusion without significant change. Mild-to-moderate peribronchial thickening with  progression. Diffuse osteopenia. Thoracic spine degenerative changes. Atheromatous aortic calcifications. IMPRESSION: 1. Stable left basilar atelectasis or pneumonia. 2. Interval mild right basilar atelectasis or pneumonia and probable pleural fluid. 3. Stable small left pleural effusion. 4. Progressive bronchitic changes. 5. Improved interstitial edema superimposed on mild chronic interstitial lung disease. 6. Stable cardiomegaly. Electronically Signed   By: Claudie Revering M.D.   On: 09/20/2018 17:37   Dg Abd Portable 1v  Result Date: 09/20/2018 CLINICAL DATA:  Nasogastric tube placement. EXAM: PORTABLE ABDOMEN - 1 VIEW COMPARISON:  Plain films of the abdomen dated 09/19/2018 and 09/09/2018. FINDINGS: Enteric tube is adequately positioned in the stomach with tip directed towards the gastric antrum E. visualized bowel gas pattern is nonobstructive. IMPRESSION: Enteric tube adequately positioned in the stomach with tip directed towards the gastric antrum. Electronically Signed   By: Franki Cabot M.D.   On: 09/20/2018 14:07    Patient Profile     85 y.o. female with past medical history of hypertension, ileus, pericarditis admitted with atrial flutter with rapid ventricular response, increased dyspnea and chest pain.  Also with epigastric pain.  Echocardiogram shows normal LV function and moderate aortic stenosis.  Assessment & Plan   1 atrial flutter-heart rate is controlled at present.  Continue Cardizem at present dose.  Continue intravenous heparin.  Echocardiogram shows normal LV function.  Not clear to me that she would hold sinus rhythm at present given active fever and abdominal pain/possible ileus.  2 chest pain-etiology unclear.  Initially felt to be secondary to atrial fibrillation with rapid ventricular response.  ECG with slight inferolateral ST elevation but troponin is normal and she is not having chest pain at present.  We will continue to cycle enzymes.  3 moderate aortic stenosis  4  abdominal pain/fever-recent KUB suggested possible ileus.  Further evaluation per primary care.  5 left pleural effusion-we will give 20 mg of Lasix today.  For questions or updates, please contact [place] Please consult [email or web address] for contact info under        Signed, Kirk Ruths, MD  09/21/2018, 8:57 AM

## 2018-09-21 NOTE — Progress Notes (Addendum)
Paged regarding episodic tachycardia--150s from HR monitor. EKG was ordered x3 which revealed concerning indications of non-contiguous ST elevation in leads II and aVF  and possible heart block. HR sustained in 60s and 70s. QTc max 482. PR recorded as 194.  Patient was assess at bedside by myself and Dr. Maricela Bo. Patient had poor verbilization due to BiPAP however indicated that she did not have chest pain but did have perhaps a sense of lightheadedness or headache. No interval changes were discovered on PE including new murmur or pulm findings. Troponin ordered. Cardiology was called and discussed case with them. They recommended waiting for troponin result and to follow up with them again at that point.

## 2018-09-21 NOTE — Progress Notes (Signed)
6712: paged DR. Chundi with pt's EKG changes.  0345: MD called back. Notified pt's bedside monitor is showing HR of 140-150's, alarmed for ventricular bigeminy . I  Performed 3 different 12 lead ekg, one of them showing critical STEMI. Notified pt is resting well in bi-pap, vitals stable and no chest pain. MD said she will call back after reviewing EKG.  0400: Dr. Maricela Bo called back, and said she will come by to see patient. Order for stat troponin received.  0405: DR. Darrick Meigs and DR. Chundi by bedside, pt did complained of mild neck discomfort, but that's pt's baseline. Denied any chest pain. MD said she will get in touch with cardiology. I didn't give any prn meds for HR as monitor was counting t wave too. 12 lead EKG HR was 69-70's. MD stated its good idea to hold any prns for HR for now.  Will continue to monitor.

## 2018-09-22 LAB — BASIC METABOLIC PANEL
Anion gap: 11 (ref 5–15)
BUN: 29 mg/dL — ABNORMAL HIGH (ref 8–23)
CO2: 26 mmol/L (ref 22–32)
Calcium: 8.6 mg/dL — ABNORMAL LOW (ref 8.9–10.3)
Chloride: 95 mmol/L — ABNORMAL LOW (ref 98–111)
Creatinine, Ser: 1.31 mg/dL — ABNORMAL HIGH (ref 0.44–1.00)
GFR calc Af Amer: 41 mL/min — ABNORMAL LOW (ref >60)
GFR calc non Af Amer: 36 mL/min — ABNORMAL LOW (ref >60)
Glucose, Bld: 129 mg/dL — ABNORMAL HIGH (ref 70–99)
Potassium: 3.8 mmol/L (ref 3.5–5.1)
Sodium: 132 mmol/L — ABNORMAL LOW (ref 135–145)

## 2018-09-22 LAB — CBC
HCT: 35.2 % — ABNORMAL LOW (ref 36.0–46.0)
Hemoglobin: 11.8 g/dL — ABNORMAL LOW (ref 12.0–15.0)
MCH: 30.9 pg (ref 26.0–34.0)
MCHC: 33.5 g/dL (ref 30.0–36.0)
MCV: 92.1 fL (ref 80.0–100.0)
Platelets: 236 10*3/uL (ref 150–400)
RBC: 3.82 MIL/uL — ABNORMAL LOW (ref 3.87–5.11)
RDW: 13 % (ref 11.5–15.5)
WBC: 17.1 10*3/uL — ABNORMAL HIGH (ref 4.0–10.5)
nRBC: 0 % (ref 0.0–0.2)

## 2018-09-22 LAB — URINE CULTURE: Culture: <10000 — AB

## 2018-09-22 LAB — HEPARIN LEVEL (UNFRACTIONATED): Heparin Unfractionated: 0.58 IU/mL (ref 0.30–0.70)

## 2018-09-22 LAB — MAGNESIUM: Magnesium: 2.2 mg/dL (ref 1.7–2.4)

## 2018-09-22 MED ORDER — FUROSEMIDE 10 MG/ML IJ SOLN
20.0000 mg | Freq: Once | INTRAMUSCULAR | Status: AC
  Administered 2018-09-22: 10:00:00 20 mg via INTRAVENOUS
  Filled 2018-09-22: qty 2

## 2018-09-22 MED ORDER — APIXABAN 5 MG PO TABS
5.0000 mg | ORAL_TABLET | Freq: Two times a day (BID) | ORAL | Status: DC
  Administered 2018-09-22 – 2018-09-23 (×4): 5 mg via ORAL
  Filled 2018-09-22 (×4): qty 1

## 2018-09-22 MED ORDER — DILTIAZEM HCL 60 MG PO TABS
90.0000 mg | ORAL_TABLET | Freq: Four times a day (QID) | ORAL | Status: DC
  Administered 2018-09-22 – 2018-09-23 (×4): 90 mg via ORAL
  Filled 2018-09-22 (×4): qty 2

## 2018-09-22 MED ORDER — POTASSIUM CHLORIDE 10 MEQ/100ML IV SOLN
10.0000 meq | INTRAVENOUS | Status: AC
  Administered 2018-09-22 (×3): 10 meq via INTRAVENOUS
  Filled 2018-09-22 (×3): qty 100

## 2018-09-22 MED ORDER — FUROSEMIDE 10 MG/ML IJ SOLN
20.0000 mg | Freq: Once | INTRAMUSCULAR | Status: AC
  Administered 2018-09-22: 18:00:00 20 mg via INTRAVENOUS
  Filled 2018-09-22: qty 2

## 2018-09-22 MED ORDER — AZITHROMYCIN 250 MG PO TABS
250.0000 mg | ORAL_TABLET | Freq: Every day | ORAL | Status: DC
  Administered 2018-09-22 – 2018-09-23 (×2): 250 mg via ORAL
  Filled 2018-09-22 (×3): qty 1

## 2018-09-22 NOTE — Progress Notes (Signed)
Progress Note  Patient Name: [name] Date of Encounter: 09/22/2018  Primary Cardiologist: Shelva Majestic, MD   Subjective   SOB earlier; no chest pain; complains of abdominal pain  Inpatient Medications    Scheduled Meds:  amLODipine  5 mg Oral Daily   furosemide  20 mg Intravenous Once   pantoprazole (PROTONIX) IV  40 mg Intravenous Q24H   polyethylene glycol  17 g Oral BID   sodium chloride flush  3 mL Intravenous Q12H   Continuous Infusions:  azithromycin 250 mg (09/21/18 2236)   cefTRIAXone (ROCEPHIN)  IV 1 g (09/21/18 2150)   diltiazem (CARDIZEM) infusion 15 mg/hr (09/22/18 0436)   heparin 1,150 Units/hr (09/21/18 2138)   potassium chloride     PRN Meds: acetaminophen **OR** acetaminophen, levalbuterol, metoprolol tartrate, ondansetron **OR** ondansetron (ZOFRAN) IV, sodium chloride flush   Vital Signs    Vitals:   09/22/18 0400 09/22/18 0737 09/22/18 0747 09/22/18 0753  BP: 128/67   120/77  Pulse: 98  (!) 105 88  Resp: 17  (!) 22 13  Temp: 98.4 F (36.9 C)   97.8 F (36.6 C)  TempSrc: Oral   Axillary  SpO2: 97% 97% 99% 99%  Weight:      Height:        Intake/Output Summary (Last 24 hours) at 09/22/2018 0934 Last data filed at 09/22/2018 0433 Gross per 24 hour  Intake 789.7 ml  Output 800 ml  Net -10.3 ml   Last 3 Weights 09/21/2018 09/21/2018 09/20/2018  Weight (lbs) 208 lb 5.4 oz 209 lb 7 oz 210 lb 8.6 oz  Weight (kg) 94.5 kg 95 kg 95.5 kg      Telemetry    Atrial fibrillation rate controlled- Personally Reviewed  Physical Exam   GEN: Elderly female on O2; NAD Neck: supple Cardiac: irregular, 2/ 6 systolic murmur; no DM Respiratory: Diminished BS bases GI: Soft, mild diffuse tenderness to palpation MS: No edema Neuro:  Grossly intact  Labs    High Sensitivity Troponin:   Recent Labs  Lab 09/18/2018 1015 09/11/2018 1235 09/21/18 0407 09/21/18 0923 09/21/18 1107  TROPONINIHS 45* 48* 14 11 13       Chemistry Recent Labs    Lab 09/09/2018 1015  09/20/18 0534 09/21/18 0407 09/22/18 0323  NA 136   < > 131* 133* 132*  K 4.3   < > 4.2 4.1 3.8  CL 102   < > 97* 96* 95*  CO2 21*   < > 25 24 26   GLUCOSE 131*   < > 122* 181* 129*  BUN 7*   < > 15 19 29*  CREATININE 1.22*   < > 1.24* 1.40* 1.31*  CALCIUM 9.0   < > 8.8* 8.8* 8.6*  PROT 6.7  --   --   --   --   ALBUMIN 3.5  --   --   --   --   AST 28  --   --   --   --   ALT 28  --   --   --   --   ALKPHOS 53  --   --   --   --   BILITOT 0.8  --   --   --   --   GFRNONAA 39*   < > 38* 33* 36*  GFRAA 45*   < > 44* 38* 41*  ANIONGAP 13   < > 9 13 11    < > = values in this interval not  displayed.     Hematology Recent Labs  Lab 09/20/18 2236 09/21/18 0500 09/22/18 0323  WBC 10.8* 10.7* 17.1*  RBC 4.06 3.97 3.82*  HGB 12.3 12.1 11.8*  HCT 37.6 37.6 35.2*  MCV 92.6 94.7 92.1  MCH 30.3 30.5 30.9  MCHC 32.7 32.2 33.5  RDW 12.9 13.0 13.0  PLT 219 219 236    BNP Recent Labs  Lab 09/18/2018 1015  BNP 418.6*     Radiology    Dg Chest Port 1 View  Result Date: 09/20/2018 CLINICAL DATA:  Dyspnea. EXAM: PORTABLE CHEST 1 VIEW COMPARISON:  09/16/2018. FINDINGS: Stable enlarged cardiac silhouette. Mildly prominent interstitial markings with mild improvement. Normal vascularity. Stable patchy and linear density at the left lung base with interval ill-defined increased density at the right lung base, including pleural fluid. Minimal left pleural effusion without significant change. Mild-to-moderate peribronchial thickening with progression. Diffuse osteopenia. Thoracic spine degenerative changes. Atheromatous aortic calcifications. IMPRESSION: 1. Stable left basilar atelectasis or pneumonia. 2. Interval mild right basilar atelectasis or pneumonia and probable pleural fluid. 3. Stable small left pleural effusion. 4. Progressive bronchitic changes. 5. Improved interstitial edema superimposed on mild chronic interstitial lung disease. 6. Stable cardiomegaly.  Electronically Signed   By: Claudie Revering M.D.   On: 09/20/2018 17:37   Dg Abd Portable 1v  Result Date: 09/20/2018 CLINICAL DATA:  Nasogastric tube placement. EXAM: PORTABLE ABDOMEN - 1 VIEW COMPARISON:  Plain films of the abdomen dated 09/19/2018 and 09/12/2018. FINDINGS: Enteric tube is adequately positioned in the stomach with tip directed towards the gastric antrum E. visualized bowel gas pattern is nonobstructive. IMPRESSION: Enteric tube adequately positioned in the stomach with tip directed towards the gastric antrum. Electronically Signed   By: Franki Cabot M.D.   On: 09/20/2018 14:07    Patient Profile     85 y.o. female with past medical history of hypertension, ileus, pericarditis admitted with atrial flutter with rapid ventricular response, increased dyspnea and chest pain.  Also with epigastric pain.  Echocardiogram shows normal LV function and moderate aortic stenosis.  Assessment & Plan   1 atrial fibrillation-heart rate is controlled at present.  Change cardizem to 90 mg every 6 hours; DC IV heparin and begin apixaban 5 mg BID. Echocardiogram shows normal LV function.  Not clear to me that she would hold sinus rhythm at present given possible pneumonia and ileus. Can consider TEE/DCCV later pending overall condition.   2 chest pain-FU enzymes negative.  3 moderate aortic stenosis-will need fu echos in the future.  4 abdominal pain/possible pneumonia-per IM.  For questions or updates, please contact [place] Please consult [email or web address] for contact info under        Signed, Kirk Ruths, MD  09/22/2018, 9:34 AM

## 2018-09-22 NOTE — Progress Notes (Signed)
[place] for Heparin Indication: Atrial Fibrillation  Allergies  Allergen Reactions  . Diclofenac Sodium Anaphylaxis  . Ibuprofen Anaphylaxis  . Lisinopril     Fall hazard, dizzy  . Adhesive [Tape]   . Codeine Nausea Only  . Tizanidine     "knocked out cold"    Patient Measurements: Weight: 96.1 kg Height: 66 inches Heparin Dosing Weight: 80.7 kg  Vital Signs: Temp: 97.8 F (36.6 C) (08/18 0753) Temp Source: Axillary (08/18 0753) BP: 120/77 (08/18 0753) Pulse Rate: 88 (08/18 0753)  Labs: Recent Labs    09/20/18 0534 09/20/18 1841 09/20/18 2236 09/21/18 0407 09/21/18 0500 09/21/18 8768 09/21/18 0923 09/21/18 1107 09/22/18 0323  HGB 12.0  --  12.3  --  12.1  --   --   --  11.8*  HCT 38.1  --  37.6  --  37.6  --   --   --  35.2*  PLT 238  --  219  --  219  --   --   --  236  HEPARINUNFRC 0.21* 0.22*  --   --   --  0.51  --   --  0.58  CREATININE 1.24*  --   --  1.40*  --   --   --   --  1.31*  TROPONINIHS  --   --   --  14  --   --  11 13  --     Estimated Creatinine Clearance: 32.4 mL/min (A) (by C-G formula based on SCr of 1.31 mg/dL (H)).   Assessment: 85 yr old female with new atrial fibrillation with RVR. Medical hx includes: abdominal pain/hx of ileus, HTN, hx pericarditis, significant atherosclerosis on past CTs. Heparin drip 1150 uts/hr HL 0.58 at goal  Continue IV heparin in setting of illeus - f/u resolution to start po AC  CBC stable, no overt bleeding or complications noted.  Goal of Therapy:  Heparin level 0.3-0.7 units/ml Monitor platelets by anticoagulation protocol: Yes   Plan:  Continue IV heparin at 1150 units/hr. Monitor daily CBC and heparin level  Monitor for s/sx of bleeding  Follow up for long term anticoagulation plan    Bonnita Nasuti Pharm.D. CPP, BCPS Clinical Pharmacist [phone] 09/22/2018 8:34 AM

## 2018-09-22 NOTE — Progress Notes (Signed)
RN called RT to bedside stating that pt is having terrible breathing was removed from BiPAP and placed onto 4LNC. RN also stated that pt had refused to be placed back onto BiPAP. RT to bedside pt appears SOB while on 4LNC. Pt agreed to be placed back on BiPAP. An adhesive barrier was placed onto pts nose to help with breakdown due to mask of BiPAP. RN made aware.

## 2018-09-22 NOTE — Progress Notes (Signed)
RN called RT stating that pt was having a hard time breathing. RT at bedside along RN. Pt appears SOB. Pt was given PRN breathing treatment with no relief. Pt was placed back on BiPAP tolerating well. RT will continue to monitor.

## 2018-09-22 NOTE — Progress Notes (Signed)
Visited patients room to place her on BIPAP she is more concerned with finding the BRAVES game on TV than anything in the world right now.  No distress noted at this time.  I advised RN I will help place patient on BIPAP when she is ready for bed.

## 2018-09-22 NOTE — Plan of Care (Signed)
  Problem: Clinical Measurements: Goal: Will remain free from infection Outcome: Progressing   Problem: Health Behavior/Discharge Planning: Goal: Ability to manage health-related needs will improve Outcome: Not Progressing   Problem: Activity: Goal: Risk for activity intolerance will decrease Outcome: Not Progressing   

## 2018-09-22 NOTE — Care Management Important Message (Signed)
Important Message  Patient Details  Name: [name] MRN: [id number] Date of Birth: 03-Aug-1924   Medicare Important Message Given:  Yes     Shelda Altes 09/22/2018, 12:41 PM

## 2018-09-22 NOTE — Progress Notes (Addendum)
   Subjective:   [name] reports shortness of breath this morning this is not improving. She was initially on BiPAP when we began our conversation, but this was removed per her request and she was placed back on nasal canula.   She reports that her stomach continues to feel painful and bloated. She did have a BM yesterday though. Denies nausea, vomiting, palpitations.   Objective:  Vital signs in last 24 hours: Vitals:   09/22/18 0400 09/22/18 0737 09/22/18 0747 09/22/18 0753  BP: 128/67   120/77  Pulse: 98  (!) 105 88  Resp: 17  (!) 22 13  Temp: 98.4 F (36.9 C)   97.8 F (36.6 C)  TempSrc: Oral   Axillary  SpO2: 97% 97% 99% 99%  Weight:      Height:       Physical Exam Vitals signs and nursing note reviewed.  Constitutional:      General: She is not in acute distress.    Appearance: She is obese.  Cardiovascular:     Rate and Rhythm: Normal rate. Rhythm irregular.  Pulmonary:     Effort: Accessory muscle usage and respiratory distress (mild) present.     Breath sounds: Rales (bibasilar fine crackles) present. No decreased breath sounds, wheezing or rhonchi.  Musculoskeletal:     Right lower leg: Edema present.     Left lower leg: Edema present.  Skin:    General: Skin is warm and dry.  Neurological:     General: No focal deficit present.     Mental Status: She is alert and oriented to person, place, and time.    Assessment/Plan:  Principal Problem:   Atrial fibrillation with RVR (HCC) Active Problems:   Moderate aortic stenosis   Ileus (HCC)   Pleural effusion   Abdominal distention   Pneumonia  #Atrial fibrillation with atrial flutter CHADsVAC 5-6 with HASBLED of 1. Echocardiogram on 8/14 with normal LVEF, no LAE/RAE, but moderate AS.   Patient has not converted back to sinus rhythm. She has not needed any Metoprolol PRN in the past 24 hours. Cardiology plans to switch patient to oral Diltiazem 90mg  q6h today, as well as switching Heparin to Apixaban.    - Cardiology on board and we appreciate the recommendations - Replace K to maintain >4.0 and Mg >2.0  - Diltiazem 90mg  q6hr - Apixaban 5mg  BID - Metoprolol PRN for HR >120  #Acute Respiratory Failure:  #Pneumonia:  Chest xray (8/16) shows stable left atelectasis or pneumonia with new right basilar atelectasis or pneumonia with pleural effusion and progressive bronchitic changes. Febrile episodes and worsening respiratory decompensation on 8/16.   Patient has been switching between [place] 4L and Bipap as needed. Volume overload 2/2 to atrial fib is likely contributing as well. She has remained afebrile but WBC worsened to 17. Will continue with current treatment regimen and monitor closely.   - Ceftriaxone + Azithromycin - Levalbuterol q6h PRN  - Bipap PRN  #Ileus  Repeat KUB on 8/15 did show a possible ileus. NG tube was placed temporarily for symptom relief on 8/16, but has been removed since. No nausea today. Last BM yesterday.   - Maintain K >4.0 - PRN zofran for nausea  - IV protonix 40 mg QD   #Hypertension - Continueon Amlodipine 5 mg   Dispo: Anticipated discharge pending medical improvement.   Dr. Jose Persia Internal Medicine PGY-1  Pager: [phone] 09/22/2018, 8:26 AM

## 2018-09-22 NOTE — Progress Notes (Signed)
During report pt became more SOB. This RN called RT to come assess pt. RT gave prn breathing treatment and placed pt on BiPAP. Pt tolerating well. Pt son, Fraser Din updated. Will continue to monitor.  Amanda Cockayne, RN

## 2018-09-23 ENCOUNTER — Inpatient Hospital Stay (HOSPITAL_COMMUNITY): Payer: Medicare Other

## 2018-09-23 ENCOUNTER — Other Ambulatory Visit: Payer: Self-pay

## 2018-09-23 DIAGNOSIS — J181 Lobar pneumonia, unspecified organism: Secondary | ICD-10-CM

## 2018-09-23 DIAGNOSIS — J918 Pleural effusion in other conditions classified elsewhere: Secondary | ICD-10-CM

## 2018-09-23 LAB — CBC
HCT: 36.5 % (ref 36.0–46.0)
Hemoglobin: 11.7 g/dL — ABNORMAL LOW (ref 12.0–15.0)
MCH: 30.5 pg (ref 26.0–34.0)
MCHC: 32.1 g/dL (ref 30.0–36.0)
MCV: 95.1 fL (ref 80.0–100.0)
Platelets: 243 10*3/uL (ref 150–400)
RBC: 3.84 MIL/uL — ABNORMAL LOW (ref 3.87–5.11)
RDW: 13.2 % (ref 11.5–15.5)
WBC: 10 10*3/uL (ref 4.0–10.5)
nRBC: 0 % (ref 0.0–0.2)

## 2018-09-23 LAB — BASIC METABOLIC PANEL
Anion gap: 10 (ref 5–15)
BUN: 26 mg/dL — ABNORMAL HIGH (ref 8–23)
CO2: 25 mmol/L (ref 22–32)
Calcium: 8.3 mg/dL — ABNORMAL LOW (ref 8.9–10.3)
Chloride: 98 mmol/L (ref 98–111)
Creatinine, Ser: 1.11 mg/dL — ABNORMAL HIGH (ref 0.44–1.00)
GFR calc Af Amer: 50 mL/min — ABNORMAL LOW (ref >60)
GFR calc non Af Amer: 43 mL/min — ABNORMAL LOW (ref >60)
Glucose, Bld: 101 mg/dL — ABNORMAL HIGH (ref 70–99)
Potassium: 3.9 mmol/L (ref 3.5–5.1)
Sodium: 133 mmol/L — ABNORMAL LOW (ref 135–145)

## 2018-09-23 MED ORDER — METOPROLOL TARTRATE 5 MG/5ML IV SOLN
5.0000 mg | Freq: Once | INTRAVENOUS | Status: DC
  Filled 2018-09-23: qty 5

## 2018-09-23 MED ORDER — POTASSIUM CHLORIDE 10 MEQ/100ML IV SOLN
10.0000 meq | INTRAVENOUS | Status: AC
  Administered 2018-09-23 (×2): 10 meq via INTRAVENOUS
  Filled 2018-09-23 (×2): qty 100

## 2018-09-23 MED ORDER — FUROSEMIDE 20 MG PO TABS
20.0000 mg | ORAL_TABLET | Freq: Every day | ORAL | Status: DC
  Administered 2018-09-23: 10:00:00 20 mg via ORAL
  Filled 2018-09-23: qty 1

## 2018-09-23 MED ORDER — SIMETHICONE 80 MG PO CHEW
80.0000 mg | CHEWABLE_TABLET | Freq: Four times a day (QID) | ORAL | Status: DC | PRN
  Administered 2018-09-23: 21:00:00 80 mg via ORAL
  Filled 2018-09-23 (×2): qty 1

## 2018-09-23 MED ORDER — METOPROLOL TARTRATE 12.5 MG HALF TABLET
12.5000 mg | ORAL_TABLET | Freq: Two times a day (BID) | ORAL | Status: DC
  Administered 2018-09-23 (×2): 12.5 mg via ORAL
  Filled 2018-09-23 (×2): qty 1

## 2018-09-23 MED ORDER — DILTIAZEM HCL ER COATED BEADS 180 MG PO CP24
360.0000 mg | ORAL_CAPSULE | Freq: Every day | ORAL | Status: DC
  Administered 2018-09-23: 10:00:00 360 mg via ORAL
  Filled 2018-09-23: qty 2

## 2018-09-23 NOTE — Progress Notes (Addendum)
Progress Note  Patient Name: [name] Date of Encounter: 09/23/2018  Primary Cardiologist: Shelva Majestic, MD   Subjective   Dyspnea improved this AM; chest "heavy"  Inpatient Medications    Scheduled Meds: . apixaban  5 mg Oral BID  . azithromycin  250 mg Oral Daily  . diltiazem  90 mg Oral Q6H  . pantoprazole (PROTONIX) IV  40 mg Intravenous Q24H  . polyethylene glycol  17 g Oral BID  . sodium chloride flush  3 mL Intravenous Q12H   Continuous Infusions: . cefTRIAXone (ROCEPHIN)  IV 1 g (09/22/18 2146)   PRN Meds: acetaminophen **OR** acetaminophen, levalbuterol, metoprolol tartrate, ondansetron **OR** ondansetron (ZOFRAN) IV, sodium chloride flush   Vital Signs    Vitals:   09/23/18 0002 09/23/18 0542 09/23/18 0615 09/23/18 0742  BP: 135/62  133/90 (!) 144/117  Pulse: (!) 104   77  Resp: (!) 21  (!) 21 17  Temp: 97.9 F (36.6 C)  97.7 F (36.5 C) 97.6 F (36.4 C)  TempSrc: Oral  Oral Oral  SpO2: 97%  97% 99%  Weight:  95.4 kg    Height:        Intake/Output Summary (Last 24 hours) at 09/23/2018 0832 Last data filed at 09/23/2018 3500 Gross per 24 hour  Intake 503.06 ml  Output 1100 ml  Net -596.94 ml   Last 3 Weights 09/23/2018 09/21/2018 09/21/2018  Weight (lbs) 210 lb 5.1 oz 208 lb 5.4 oz 209 lb 7 oz  Weight (kg) 95.4 kg 94.5 kg 95 kg      Telemetry    Atrial fibrillation rate upper normal to mildly elevated- Personally Reviewed  Physical Exam   GEN: WD Elderly female on O2; NAD Neck: No JVD Cardiac: irregular, 2/ 6 systolic murmur Respiratory: Minimal basilar cracklese GI: Soft, mild diffuse tenderness to palpation, no masses MS: No edema Neuro:  No focal findings  Labs    High Sensitivity Troponin:   Recent Labs  Lab 09/29/2018 1015 09/18/2018 1235 09/21/18 0407 09/21/18 0923 09/21/18 1107  TROPONINIHS 45* 48* 14 11 13       Chemistry Recent Labs  Lab 09/26/2018 1015  09/21/18 0407 09/22/18 0323 09/23/18 0500  NA 136   < > 133*  132* 133*  K 4.3   < > 4.1 3.8 3.9  CL 102   < > 96* 95* 98  CO2 21*   < > 24 26 25   GLUCOSE 131*   < > 181* 129* 101*  BUN 7*   < > 19 29* 26*  CREATININE 1.22*   < > 1.40* 1.31* 1.11*  CALCIUM 9.0   < > 8.8* 8.6* 8.3*  PROT 6.7  --   --   --   --   ALBUMIN 3.5  --   --   --   --   AST 28  --   --   --   --   ALT 28  --   --   --   --   ALKPHOS 53  --   --   --   --   BILITOT 0.8  --   --   --   --   GFRNONAA 39*   < > 33* 36* 43*  GFRAA 45*   < > 38* 41* 50*  ANIONGAP 13   < > 13 11 10    < > = values in this interval not displayed.     Hematology Recent Labs  Lab 09/21/18 0500  09/22/18 0323 09/23/18 0500  WBC 10.7* 17.1* 10.0  RBC 3.97 3.82* 3.84*  HGB 12.1 11.8* 11.7*  HCT 37.6 35.2* 36.5  MCV 94.7 92.1 95.1  MCH 30.5 30.9 30.5  MCHC 32.2 33.5 32.1  RDW 13.0 13.0 13.2  PLT 219 236 243    BNP Recent Labs  Lab 09/25/2018 1015  BNP 418.6*     Patient Profile     85 y.o. female with past medical history of hypertension, ileus, pericarditis admitted with atrial flutter with rapid ventricular response, increased dyspnea and chest pain.  Also with epigastric pain.  Echocardiogram shows normal LV function and moderate aortic stenosis.  Assessment & Plan   1 atrial fibrillation-heart rate is mildly elevated.  Change cardizem to 360 mg daily and add metoprolol 12.5 mg BID; continue apixaban 5 mg BID. Echocardiogram shows normal LV function.  Not clear to me that she would hold sinus rhythm at present given possible pneumonia and ileus.  However will consider TEE guided cardioversion on Friday if she continues to remain dyspneic; will add lasix 20 mg daily.   2 chest pain-FU enzymes negative.  3 moderate aortic stenosis-will need fu echos in the future.  4 abdominal pain/possible pneumonia-per IM.  For questions or updates, please contact [place] Please consult [email or web address] for contact info under        Signed, Kirk Ruths, MD  09/23/2018, 8:32 AM

## 2018-09-23 NOTE — Progress Notes (Signed)
Updated pt son, Saralyn Pilar, about patients night via phone.  Clyde Canterbury, RN

## 2018-09-23 NOTE — Progress Notes (Signed)
Subjective:   [name] reports she is having some chest heaviness overnight and continues to have shortness of breath.  She refers to use nasal cannula over BiPAP stating that her BiPAP is hurting her nose.  In addition, her abdominal pain is unchanged but today she denies nausea, vomiting.  She is passing gas daily and last bowel movement was yesterday.  Objective:  Vital signs in last 24 hours: Vitals:   09/22/18 2024 09/23/18 0002 09/23/18 0542 09/23/18 0615  BP: 130/60 135/62  133/90  Pulse: 94 (!) 104    Resp: (!) 23 (!) 21  (!) 21  Temp:  97.9 F (36.6 C)  97.7 F (36.5 C)  TempSrc:  Oral  Oral  SpO2: 98% 97%  97%  Weight:   95.4 kg   Height:       Physical Exam Vitals signs and nursing note reviewed.  Constitutional:      General: She is not in acute distress.    Appearance: She is obese. She is not toxic-appearing.  Cardiovascular:     Rate and Rhythm: Tachycardia present. Rhythm irregular.     Heart sounds: No murmur. No gallop.   Pulmonary:     Effort: Tachypnea and accessory muscle usage present.     Breath sounds: Wheezing present. No rales (Unable to assess middle or lower lobes though).  Abdominal:     Palpations: Abdomen is soft.  Skin:    General: Skin is warm and dry.  Neurological:     General: No focal deficit present.     Mental Status: She is alert and oriented to person, place, and time.  Psychiatric:        Mood and Affect: Mood normal.        Behavior: Behavior normal.    Assessment/Plan:  Principal Problem:   Atrial fibrillation with RVR (HCC) Active Problems:   Moderate aortic stenosis   Ileus (HCC)   Pleural effusion   Abdominal distention   Pneumonia  #Atrial fibrillation with atrial flutter CHADsVAC 5-6 with HASBLED of 1. Echocardiogramon 8/14with normal LVEF, no LAE/RAE, but moderate AS.   Patient has not converted back to sinus rhythm and HR fluctuates between low 100s and 130s. While speaking to patient today, did jump  up to 130s a few times. Cardiology has switched patient to Diltiazem 360mg  QD and added Metoprolol 12.5mg  BID and Lasix 20mg  PO QD. She did get IV Lasix 20mg  BID yesterday so unsure if new lasix dose will be adequate for diuresis. Will continue to monitor her volume level. She is still being considered for cardioversion on Friday, August 21, if she has not converted by then.   - Cardiologyon board and we appreciate the recommendations - Replace K to maintain >4.0 and Mg >2.0  - Diltiazem 360mg  QD - Apixaban 5mg  BID - Metoprolol 12.5mg  BID - Metoprolol PRN for HR >120   #Acute Respiratory Failure: #Pneumonia: Chest xray (8/16) shows stable left atelectasis or pneumonia with new right basilar atelectasis or pneumonia with pleural effusion and progressive bronchitic changes. Febrile episodesand worsening respiratorydecompensation on 8/16.   Leukocytosis improved significantly and no febrile events. Patient has been switching between [place] 4L and Bipap as needed. This morning, she was on 4L [place] with good oxygen saturation though. Will try to wean her requirement of oxygen as tolerated. Repeat chest xray today does show a more pronounced consolidation on the lateral view. Will continue treatment for pneumonia with last day of treatment on 8/21 (total treatment time =  5 days). Continued diuresis should help oxygenation as well.   - Ceftriaxone + Azithromycin - Levalbuterol q6h PRN - Bipap PRN  #Ileus Repeat KUB on 8/15 did show a possible ileus. NG tube was placed temporarily for symptom relief on 8/16, but has been removed since. No nausea today and she continues to pass gas/have BMs.   - Maintain K >4.0 - PRN zofran for nausea  - IV protonix 40 mg QD   #Hypertension Cardiology discontinued Amlodipine yesterday. Her BP this morning was well controlled with her medications for atrial fib. Will continue to monitor.    Dispo: Anticipated dischargepending medical improvement.   Dr. Jose Persia Internal Medicine PGY-1  Pager: [phone] 09/23/2018, 6:54 AM

## 2018-09-23 NOTE — Progress Notes (Addendum)
Pt was complaining of abd pain radiating to chest, dyspnea, shortness of breath. Pt was very uncomfortable and anxious since beginning of shift. HR is a fib in 120s-140s. Paged Dr. Maricela Bo. Received orders of NPO diet, NG tube placement and CT of pelvis w contrast. Paged rapid response Mindy to assist with NG tube insertion. Dr. Maricela Bo & Dr. Darrick Meigs at bedside. Oxygen sat remained above 90% with 4L [place], but pt was extremely anxious having dyspnea. Pt was refusing NG tube insertion at first. Pt was educated on the importance of NG tube placement and agreed to try. Pt's son Saralyn Pilar) called about 2300 and was informed. While placing the NG tube, pt kept stating "just kill me, but please stop this. I don't want to do this anymore. I am tired." Pt has been restless throughout shift stating that "it is time for her to go". Code status should be addressed. Pt was given Ramelteon 8mg  PO via NG tube to help her sleep. Pt refused am bath, IV dressing change at this time and requesting to rest. Vital signs stable. Will pass on to day shift nurse and continue to monitor.  Fransico Michael, RN

## 2018-09-23 NOTE — Progress Notes (Addendum)
Paged at 2230 for sustained HR >130 and abdominal pain. Pt assessed at bedside by myself and Dr. Maricela Bo.  Patient notes persistent abdominal discomfort.  Pain is constant and dull with intermittent sharp feeling. Endorses passing flatus and belching. Patient denies chest pain.  PE: A/Ox3. Appears restless and continues moving from her back to her side to get comfortable. Tachycardic heart rate. Irregular rhythm. Tachypnic. Wheezing. No crackles appreciated anteriorly. High pitched bowel sounds present in 4 quadrants. Abdomen distended and firm. Tender to palpation. No rebound tenderness.   Abdominal xray ordered earlier today and was completed. Official read not available. Did attempt to take verbal read however was unable to reach radiologists. Abd pain likely contributing to tachycardia and tachypnea.  BiPAP trialed however patient does not tolerate this very well due to the discomfort of the nasal piece. Due to patient's discomfort and sustained HR >130, NG placed and placed on suction--approx 450mL out within minutes. HR decreased to 110-120s which has been closer to baseline during hospitalization. Patient appeared much more comfortable.  We appreciate nursing staff and encouraged them to let us know if patient's tachycardia or abdominal pain worsens.

## 2018-09-23 NOTE — Progress Notes (Signed)
Paged about worsening abdominal pain. Presented to the bedside for further evaluation. Patient's son is at bedside. States that the patient some chicken broth, a cup of apple juice, and milk. She subsequently began to experience increased belching, abdominal distention, and severe pain. She did pass flatus states that that proved her pain. Patient son is also worried about her swallowing function and whether she is having difficulty initiating the swallow.   On physical exam she is in visible distress. Her abdomen is distended and tympanic. She is diffuse tenderness to palpation. Bowel sounds are present with a high-pitched trickling noise.  On review of history she has had an umbilical hernia repair with mesh work placement.  Discussed with the patient and patient's son that we think she is suffering from ileus. That we are managing her electrolytes and limiting medications that slow the bowels. We discussed whether CT abdomen would be needed. We also discussed her aspiration pneumonia and her atrial fibrillation. All questions and concerns were addressed.  Plan: - Repeat abdominal film. May need CT abdomen to rule out obstruction secondary to adhesions. - Simethicone 80 mg QID PRN  - Speech eval

## 2018-09-23 NOTE — Progress Notes (Signed)
Asked to assist with NGT insertion as pt is very anxious. 18FR ngt placed in L nare with somewhat difficulty d/t pt anxiety. Positive air bolus heard by myself and pts RN Juhi. NGT taped securely to pt's nose. NGT placed on continuous sx yielding 500cc brown/tan drainage. NGT then placed to LIWS. Plan for CT ABD to r/o obstruction. Pt's SpO2 pre and post procedure remained 94-100% on 4L [place].

## 2018-09-23 NOTE — Progress Notes (Signed)
Pt ate a few bites of chicken broth and complained of severe pain 8/10 all over her abdomen. Abdomen tight and tender on palpation. Pt says her stomach feels very tight and bloated. Abdomen sounds hypoactive. MD paged and at bedside to assess.  Clyde Canterbury, RN

## 2018-09-24 ENCOUNTER — Inpatient Hospital Stay (HOSPITAL_COMMUNITY): Payer: Medicare Other

## 2018-09-24 DIAGNOSIS — R935 Abnormal findings on diagnostic imaging of other abdominal regions, including retroperitoneum: Secondary | ICD-10-CM

## 2018-09-24 LAB — CBC
HCT: 38.6 % (ref 36.0–46.0)
Hemoglobin: 12.2 g/dL (ref 12.0–15.0)
MCH: 29.9 pg (ref 26.0–34.0)
MCHC: 31.6 g/dL (ref 30.0–36.0)
MCV: 94.6 fL (ref 80.0–100.0)
Platelets: 260 10*3/uL (ref 150–400)
RBC: 4.08 MIL/uL (ref 3.87–5.11)
RDW: 12.9 % (ref 11.5–15.5)
WBC: 8 10*3/uL (ref 4.0–10.5)
nRBC: 0 % (ref 0.0–0.2)

## 2018-09-24 LAB — BASIC METABOLIC PANEL
Anion gap: 12 (ref 5–15)
Anion gap: 10 (ref 5–15)
BUN: 21 mg/dL (ref 8–23)
BUN: 15 mg/dL (ref 8–23)
CO2: 23 mmol/L (ref 22–32)
CO2: 24 mmol/L (ref 22–32)
Calcium: 8.5 mg/dL — ABNORMAL LOW (ref 8.9–10.3)
Calcium: 8.4 mg/dL — ABNORMAL LOW (ref 8.9–10.3)
Chloride: 96 mmol/L — ABNORMAL LOW (ref 98–111)
Chloride: 99 mmol/L (ref 98–111)
Creatinine, Ser: 0.98 mg/dL (ref 0.44–1.00)
Creatinine, Ser: 0.9 mg/dL (ref 0.44–1.00)
GFR calc Af Amer: 58 mL/min — ABNORMAL LOW (ref >60)
GFR calc Af Amer: >60 mL/min (ref >60)
GFR calc non Af Amer: 50 mL/min — ABNORMAL LOW (ref >60)
GFR calc non Af Amer: 56 mL/min — ABNORMAL LOW (ref >60)
Glucose, Bld: 99 mg/dL (ref 70–99)
Glucose, Bld: 108 mg/dL — ABNORMAL HIGH (ref 70–99)
Potassium: 3.6 mmol/L (ref 3.5–5.1)
Potassium: 3.8 mmol/L (ref 3.5–5.1)
Sodium: 131 mmol/L — ABNORMAL LOW (ref 135–145)
Sodium: 133 mmol/L — ABNORMAL LOW (ref 135–145)

## 2018-09-24 LAB — MAGNESIUM: Magnesium: 2.1 mg/dL (ref 1.7–2.4)

## 2018-09-24 LAB — APTT: aPTT: 76 seconds — ABNORMAL HIGH (ref 24–36)

## 2018-09-24 MED ORDER — DILTIAZEM HCL-DEXTROSE 100-5 MG/100ML-% IV SOLN (PREMIX)
5.0000 mg/h | INTRAVENOUS | Status: DC
  Administered 2018-09-24: 12.5 mg/h via INTRAVENOUS
  Administered 2018-09-24: 12:00:00 7.5 mg/h via INTRAVENOUS
  Administered 2018-09-24: 5 mg/h via INTRAVENOUS
  Administered 2018-09-24: 15 mg/h via INTRAVENOUS
  Administered 2018-09-24: 13:00:00 10 mg/h via INTRAVENOUS
  Administered 2018-09-24 – 2018-10-07 (×46): 15 mg/h via INTRAVENOUS
  Filled 2018-09-24 (×58): qty 100

## 2018-09-24 MED ORDER — IOHEXOL 300 MG/ML  SOLN
100.0000 mL | Freq: Once | INTRAMUSCULAR | Status: AC | PRN
  Administered 2018-09-24: 06:00:00 100 mL via INTRAVENOUS

## 2018-09-24 MED ORDER — RAMELTEON 8 MG PO TABS
8.0000 mg | ORAL_TABLET | Freq: Once | ORAL | Status: AC
  Administered 2018-09-24: 04:00:00 8 mg via ORAL
  Filled 2018-09-24: qty 1

## 2018-09-24 MED ORDER — POTASSIUM CHLORIDE 10 MEQ/100ML IV SOLN
10.0000 meq | INTRAVENOUS | Status: AC
  Administered 2018-09-24 (×4): 10 meq via INTRAVENOUS
  Filled 2018-09-24 (×4): qty 100

## 2018-09-24 MED ORDER — DEXTROSE 5 % IV SOLN
250.0000 mg | INTRAVENOUS | Status: AC
  Administered 2018-09-24 – 2018-09-25 (×2): 250 mg via INTRAVENOUS
  Filled 2018-09-24 (×2): qty 250

## 2018-09-24 MED ORDER — HEPARIN (PORCINE) 25000 UT/250ML-% IV SOLN
1100.0000 [IU]/h | INTRAVENOUS | Status: DC
  Administered 2018-09-24 – 2018-09-26 (×3): 1150 [IU]/h via INTRAVENOUS
  Administered 2018-09-27: 1000 [IU]/h via INTRAVENOUS
  Administered 2018-09-28: 1050 [IU]/h via INTRAVENOUS
  Administered 2018-09-28 – 2018-09-29 (×2): 1200 [IU]/h via INTRAVENOUS
  Administered 2018-09-30: 1250 [IU]/h via INTRAVENOUS
  Administered 2018-10-01: 12:00:00 1150 [IU]/h via INTRAVENOUS
  Administered 2018-10-02 – 2018-10-03 (×2): 1000 [IU]/h via INTRAVENOUS
  Administered 2018-10-04: 1250 [IU]/h via INTRAVENOUS
  Administered 2018-10-05 – 2018-10-08 (×5): 1150 [IU]/h via INTRAVENOUS
  Administered 2018-10-09 – 2018-10-12 (×3): 1050 [IU]/h via INTRAVENOUS
  Filled 2018-09-24 (×20): qty 250

## 2018-09-24 NOTE — Progress Notes (Signed)
Tap water enema administrated. Large amount of watery brown stool produced. Rectal tube placed per order. Pt tolerated well.  Clyde Canterbury, RN

## 2018-09-24 NOTE — Progress Notes (Signed)
ANTICOAGULATION CONSULT NOTE - Follow Up Consult  Pharmacy Consult for: resume IV heparin (Eliquis held) Indication: atrial fibrillation  Allergies  Allergen Reactions  . Diclofenac Sodium Anaphylaxis  . Ibuprofen Anaphylaxis  . Lisinopril     Fall hazard, dizzy  . Adhesive [Tape]   . Codeine Nausea Only  . Tizanidine     "knocked out cold"    Patient Measurements: Height: 5\' 6"  (167.6 cm) Weight: 206 lb 5.6 oz (93.6 kg) IBW/kg (Calculated) : 59.3 Heparin Dosing Weight: 80 kg  Vital Signs: Temp: 97.8 F (36.6 C) (08/20 1634) Temp Source: Oral (08/20 1634) BP: 133/105 (08/20 1634) Pulse Rate: 165 (08/20 1634)  Labs: Recent Labs    09/22/18 0323 09/23/18 0500 09/24/18 0415 09/24/18 1859  HGB 11.8* 11.7* 12.2  --   HCT 35.2* 36.5 38.6  --   PLT 236 243 260  --   APTT  --   --   --  76*  HEPARINUNFRC 0.58  --   --   --   CREATININE 1.31* 1.11* 0.98  --     Estimated Creatinine Clearance: 43.1 mL/min (by C-G formula based on SCr of 0.98 mg/dL).  Assessment:  85 yr old female with new atrial fibrillation this admit. Received IV heparin 8/14>>8/18, when changed to Eliquis 5 mg PO BID.  Meds back to IV today due to ileus.  Last Eliquis dose 8/19 ~9pm.   Previously therapeutic on heparin at 1150 units/hr.  Will need to use aPTTs for heparin monitoring, while heparin levels expected to be falsely elevated due to recent Eliquis doses.  PM update: APTT 76  Goal of Therapy:  Heparin level 0.3-0.7 units/ml aPTT 66-102 seconds Monitor platelets by anticoagulation protocol: Yes   Plan:   Continue  heparin drip at 1150 units/hr.   Confirmatory  aPTT ~8 hrs .  Daily aPTT, heparin level and CBC.  Salim Forero A. Levada Dy, PharmD, BCPS, FNKF Clinical Pharmacist [place] Please utilize Amion for appropriate phone number to reach the unit pharmacist ([place])   09/24/2018,8:00 PM

## 2018-09-24 NOTE — Progress Notes (Signed)
Occupational Therapy Evaluation Patient Details Name: [name] MRN: [id number] DOB: November 28, 1924 Today's Date: 09/24/2018    History of Present Illness 85 y.o. female with past medical history of hypertension, ileus, pericarditis admitted with atrial flutter with rapid ventricular response, increased dyspnea and chest pain.  Also with epigastric pain.  Echocardiogram shows normal LV function and moderate aortic stenosis.   Clinical Impression   PTA, pt was living at home with her son, and was mostly independent with ADL and received assistance from son for IADL and pt was modified independent with functional mobility with intermittent use of RW. Pt currently limited to bed level secondary to tachy HR at rest, HR up to 161 mostly in the 120s, deferred mobility, RN aware. Educated pt and pt's son, who was present during session, on importance of ROM and positional changes to maintain skin integrity. Due to decline in current level of function, pt would benefit from acute OT to address established goals to facilitate safe D/C to venue listed below. At this time, recommend SNF follow-up. Will continue to follow acutely.     Follow Up Recommendations  SNF;Home health OT;Supervision/Assistance - 24 hour    Equipment Recommendations  3 in 1 bedside commode    Recommendations for Other Services       Precautions / Restrictions Precautions Precautions: Fall(NG tube) Restrictions Weight Bearing Restrictions: No      Mobility Bed Mobility               General bed mobility comments: deferred  Transfers                 General transfer comment: deferred    Balance                                           ADL either performed or assessed with clinical judgement   ADL Overall ADL's : Needs assistance/impaired   Eating/Feeding Details (indicate cue type and reason): NG tube Grooming: Wash/dry face;Set up;Sitting;Bed level                                  General ADL Comments: limited participation during session due to level of arousal and tachy HR up to 161bpm at rest;pt max-totalA for most ADL this date;will require further assessment     Vision Baseline Vision/History: Macular Degeneration Patient Visual Report: No change from baseline       Perception     Praxis      Pertinent Vitals/Pain Pain Assessment: Faces Faces Pain Scale: Hurts little more Pain Location: NG tube Pain Descriptors / Indicators: Grimacing;Guarding Pain Intervention(s): Limited activity within patient's tolerance;Monitored during session     Hand Dominance Right   Extremity/Trunk Assessment Upper Extremity Assessment Upper Extremity Assessment: Generalized weakness   Lower Extremity Assessment Lower Extremity Assessment: Defer to PT evaluation       Communication Communication Communication: No difficulties   Cognition Arousal/Alertness: Lethargic Behavior During Therapy: Flat affect;Anxious Overall Cognitive Status: Difficult to assess                                 General Comments: pt oriented x4, difficult to assess cognition further   General Comments  pt's son present during session, provided pt's PLOF and  home setup;educated pt and son on progressive muscle relaxation strategy to implement into pt's routine. educated pt on elevating BUE and BLE and continuing to move BUE and BLE as tolerated;educated pt and son on frequent positional changes to maintain skin integrity     Exercises Exercises: Low Level/ICU Low Level/ICU Exercises Ankle Circles/Pumps: 5 reps;Both;Supine;AROM Breathing Exercises: 10 reps;Supine Shoulder Flexion: PROM;Both;10 reps;Supine Elbow Flexion: AROM;Both;10 reps;Supine   Shoulder Instructions      Home Living Family/patient expects to be discharged to:: Private residence Living Arrangements: Children Available Help at Discharge: Family;Available 24 hours/day Type of Home:  House Home Access: Stairs to enter [employer] of Steps: 3 Entrance Stairs-Rails: Left Home Layout: One level     Bathroom Shower/Tub: Occupational psychologist: Standard     Home Equipment: Bedside commode;Shower seat;Walker - standard          Prior Functioning/Environment Level of Independence: Needs assistance  Gait / Transfers Assistance Needed: Pt used walker to stabilize then ambulated without use of AD ADL's / Homemaking Assistance Needed: pt appears to be mostly independent, son assists with IADLs            OT Problem List: Decreased strength;Decreased range of motion;Decreased activity tolerance;Impaired balance (sitting and/or standing);Decreased safety awareness;Decreased knowledge of use of DME or AE;Decreased knowledge of precautions;Impaired vision/perception;Pain;Cardiopulmonary status limiting activity      OT Treatment/Interventions: Self-care/ADL training;Therapeutic exercise;Energy conservation;DME and/or AE instruction;Therapeutic activities;Visual/perceptual remediation/compensation;Patient/family education;Balance training    OT Goals(Current goals can be found in the care plan section) Acute Rehab OT Goals Patient Stated Goal: to keep taking care of herself OT Goal Formulation: With patient Time For Goal Achievement: 10/08/18 Potential to Achieve Goals: Good ADL Goals Pt Will Perform Grooming: with set-up;sitting Pt Will Perform Upper Body Dressing: with min guard assist;sitting Pt Will Perform Lower Body Dressing: with min guard assist;sit to/from stand Pt Will Transfer to Toilet: ambulating;with min assist Pt Will Perform Toileting - Clothing Manipulation and hygiene: with min assist;sit to/from stand Additional ADL Goal #1: Pt will progress to EOB with supervision in preparation for ADL.  OT Frequency: Min 2X/week   Barriers to D/C:            Co-evaluation              AM-PAC OT "6 Clicks" Daily Activity      Outcome Measure Help from another person eating meals?: Total Help from another person taking care of personal grooming?: A Lot Help from another person toileting, which includes using toliet, bedpan, or urinal?: Total Help from another person bathing (including washing, rinsing, drying)?: Total Help from another person to put on and taking off regular upper body clothing?: Total Help from another person to put on and taking off regular lower body clothing?: Total 6 Click Score: 7   End of Session Equipment Utilized During Treatment: Oxygen Nurse Communication: Mobility status;Other (comment)(vitals)  Activity Tolerance: Patient limited by fatigue;Patient limited by lethargy Patient left: in bed;with call bell/phone within reach;with bed alarm set;with family/visitor present  OT Visit Diagnosis: Other abnormalities of gait and mobility (R26.89);History of falling (Z91.81);Pain;Feeding difficulties (R63.3) Pain - part of body: (abdomen)                Time: 1610-9604 OT Time Calculation (min): 22 min Charges:  OT General Charges $OT Visit: 1 Visit OT Evaluation $OT Eval Moderate Complexity: [place] Office: [place] 09/24/2018, 1:42 PM

## 2018-09-24 NOTE — Progress Notes (Signed)
Subjective:   [name] reports abdominal pain is improved compared to prior. She did spit up a little earlier but denies N/V. Her throat hurts due to the NG tube. Last bowel movement was two days ago. She continues to have [facility] that is the same compared to days prior. She denies CP.   Discussed the results of her CT abdomen and that we would consult GI to discuss decompression. She voices understanding. All questions and concerns addressed.   Objective:  Vital signs in last 24 hours: Vitals:   09/23/18 2225 09/23/18 2345 09/24/18 0404 09/24/18 0540  BP:  123/68 129/77   Pulse: (!) 135 74 (!) 106 63  Resp: (!) 23 (!) 21 15 18   Temp:  98 F (36.7 C) 97.9 F (36.6 C)   TempSrc:  Oral Oral   SpO2: 95% 92% 99% 97%  Weight:   93.6 kg   Height:       Physical Exam Constitutional:      General: She is not in acute distress.    Appearance: She is obese.  Cardiovascular:     Rate and Rhythm: Tachycardia present. Rhythm irregular.  Pulmonary:     Breath sounds: Wheezing (diffuse, mild) and rales (Left more prominent than right.) present. No decreased breath sounds or rhonchi.  Abdominal:     General: Bowel sounds are decreased. There is distension.     Palpations: Abdomen is soft.     Tenderness: There is abdominal tenderness in the right upper quadrant and right lower quadrant.  Neurological:     Mental Status: She is alert.    Assessment/Plan:  Principal Problem:   Atrial fibrillation with RVR (HCC) Active Problems:   Moderate aortic stenosis   Ileus (HCC)   Pleural effusion   Abdominal distention   Pneumonia  #Atrial fibrillation with atrial flutter CHADsVAC 5-6 with HASBLED of 1. Echocardiogramon 8/14with normal LVEF, no LAE/RAE, but moderate AS.   Patient has not converted back to sinus rhythm. Due worsening abdominal pain, patient's heart rate has sustained in the 130s overnight and into today.  Slightly improved on examination in the 120s.  Cardiology has  switched her p.o. medication back to IV given her ileus. She continues to be on Diltiazem but Metoprolol only PRN now. Apixaban switched to Heparin. Potassium was 3.6 today and replaced 40 mEq  - Cardiologyon board and we appreciate the recommendations - Replace K to maintain >4.0 and Mg >2.0 - Diltiazem IV   - Heparin per pharmacy - Metoprolol PRN for HR >120   #Ileus Repeat KUB on 8/15 did show a possible ileus.NG tube was placed temporarily for symptom relief on 8/16 and replaced overnight last night due to worsening abdominal pain and distention. Repeat xray demonstrated non-obstructive bowel gas patterns. CT abdomen/pelvis obtained due to decompensation clinically. No obstruction or inflammation but ileus affecting the cecum that is more than 10cm wide. Due to risk of ischemia, GI was consulted. Perhaps patient could benefit from colonoscopy decompression. Will follow up their recommendations.    - Maintain K >4.0 - PRN zofran for nausea  - IV protonix 40 mg QD  - NPO for bowel rest - NG tube placed on suction for symptom relief - GI on board and we appreciate their recommendations  #Acute Respiratory Failure: #Pneumonia: Chest xray (8/16) shows stable left atelectasis or pneumonia with new right basilar atelectasis or pneumonia with pleural effusion and progressive bronchitic changes. Febrile episodesand worsening respiratorydecompensationon8/16.   Leukocytosis continues to improve. Patient has  been saturating well on 4L but she continues to have SOB. After tomorrow's dose of antibiotics, she will have received 5 days of treatment. Plan to discontinue at that time.   - Ceftriaxone + Azithromycin - Levalbuterol q6h PRN  #Hypertension Cardiology discontinued Amlodipine yesterday. Her BP this morning was well controlled with her medications for atrial fib. Will continue to monitor.    Dispo: Anticipated dischargepending medical improvement.  Dr. Jose Persia Internal Medicine PGY-1  Pager: [phone] 09/24/2018, 7:35 AM

## 2018-09-24 NOTE — Consult Note (Addendum)
Referring Provider: Internal Medicine Teaching Service          Primary Care Physician:  Jettie Booze, NP Primary Gastroenterologist:   unassigned        Reason for Consultation:   ileus                ASSESSMENT /  PLAN     85 yo female with probable diffuse ileus in setting of acute illness. She has had this before (we saw her in 2016 for the same).  -Continue conservative management ( NGT, NPO for now, IVF). Keep K+ > 4 if possible. She hasn't been mobile this admission. Says Cardiology didn't want her ambulating. Would help to get up in chair at least. She does log rolls per son. Not requiring narcotics thankfully. -Will add tap water enema followed by placement of rectal tube -am follow up KUB     HPI:     [name] is a 85 y.o. female with PMH significant for HTN, aortic stenosis.   Admitted 09/13/2018 with SOB, fatigue. On admission she was in new Afib / flutter with RVR. Echo shows progression of AS from mild to moderate. Cardiology following and has been making adjustments to medications to control rate but planning on TEE guided cardioversion tomorrow. She has periodically required BiPap this admission for acute respiratory failure. CXR suggests PNA with pleural effusion and progressive bronchitic changes. Treated with IV antibiotics  We saw [name] for constipation and ileus vrs partial SBO during an admission Aug 2016 . At the time she was admitted with an acute compression fracture of her back.  KUB suggested partial SBO but we favored ileus.  We purged bowels recommended minimization of narcotics, increased activity, etc. Symptoms improved with conservative measures. We saw her in office for hospital follow up on 10/14/14 and she was doing well. Follow up KUB was normal.  Since then her Son (caretaker) has managed bowels with Miralax and / or prune juice as needed. He felt like bowels were moving okay at home until he noticed mother's abdomen becoming distended and she didn't  want to eat.  This admission patient continues to complain of abdominal distention. She has had intermittent nausea / vomiting requiring NGT for decompression. NGT had to be replaced yesterday due to vomiting. This has complicated ability to transition her to PO meds. KUB today shows mild gaseous distention of stomach and increased gaseous distention of colon.   Past Medical History:  Diagnosis Date   Anaphylactic shock 1990   Arthritis    cervical, spurs   Back pain 05/16/2011   Bowel obstruction (HCC)    Broken leg 1996   Cancer ([place])    basal cells on back, shoulder and nose   Chicken pox as a child   DOE (dyspnea on exertion) 05/16/2011   Fainting spell    X 1 time, at home in hospital 3 days   History of shingles    Hyperlipidemia 06/19/2011   Hypertension    Hyponatremia 11/12/2011   Measles as a child   Mumps as a child   Neck pain, chronic    Pericarditis    Preventative health care 05/16/2011   Psoriasis    scalp   Sacroiliac dysfunction 11/12/2011   Seborrhea capitis    Seborrheic keratosis    UTI (lower urinary tract infection) 06/23/2011    Past Surgical History:  Procedure Laterality Date   back disc repair  1979   low back with LLE  radiculopathy   biopsy left leg  10-23-11   benign   BREAST LUMPECTOMY Left 1947   CATARACT EXTRACTION, BILATERAL     FEMUR SURGERY Left    TOTAL KNEE ARTHROPLASTY Bilateral 1990   TUBAL LIGATION      Prior to Admission medications   Medication Sig Start Date End Date Taking? Authorizing Provider  acetaminophen (TYLENOL) 500 MG tablet Take 500 mg by mouth every 4 (four) hours as needed for mild pain, moderate pain or headache (Max 4 doses in 24 hours).    Yes [provider]  amLODipine (NORVASC) 5 MG tablet Take 1 tablet (5 mg total) by mouth daily. 05/16/11  Yes Mosie Lukes, MD  docusate sodium (COLACE) 100 MG capsule Take 1 capsule (100 mg total) by mouth 2 (two) times daily as needed  for mild constipation (you can take it twice a day if no BM in 24hours). 10/01/14  Yes Rai, Ripudeep K, MD  furosemide (LASIX) 20 MG tablet Take 20 mg by mouth as needed for fluid.  07/27/18  Yes [provider]  metroNIDAZOLE (METROCREAM) 0.75 % cream Apply 1 application topically as needed (rash on face).  07/27/18  Yes [provider]  polyethylene glycol (MIRALAX / GLYCOLAX) packet Take 17 g by mouth daily. Patient taking differently: Take 17 g by mouth daily as needed for mild constipation or moderate constipation.  10/01/14  Yes Rai, Vernelle Emerald, MD  witch hazel-glycerin (TUCKS) pad Apply topically 2 (two) times daily. Patient taking differently: Apply topically 2 (two) times daily. After bm's 10/01/14   Rai, Vernelle Emerald, MD    Current Facility-Administered Medications  Medication Dose Route Frequency Provider Last Rate Last Dose   acetaminophen (TYLENOL) tablet 650 mg  650 mg Oral Q6H PRN Neva Seat, MD   650 mg at 09/24/18 0215   Or   acetaminophen (TYLENOL) suppository 650 mg  650 mg Rectal Q6H PRN Neva Seat, MD   650 mg at 09/20/18 2240   azithromycin (ZITHROMAX) 250 mg in dextrose 5 % 125 mL IVPB  250 mg Intravenous Q24H Jose Persia, MD 125 mL/hr at 09/24/18 1232 250 mg at 09/24/18 1232   cefTRIAXone (ROCEPHIN) 1 g in sodium chloride 0.9 % 100 mL IVPB  1 g Intravenous QHS Chundi, Vahini, MD 200 mL/hr at 09/23/18 2202 1 g at 09/23/18 2202   diltiazem (CARDIZEM) 100 mg in dextrose 5% 19mL (1 mg/mL) infusion  5-15 mg/hr Intravenous Titrated Lelon Perla, MD 10 mL/hr at 09/24/18 1329 10 mg/hr at 09/24/18 1329   heparin ADULT infusion 100 units/mL (25000 units/258mL sodium chloride 0.45%)  1,150 Units/hr Intravenous Continuous Skeet Simmer, RPH 11.5 mL/hr at 09/24/18 1039 1,150 Units/hr at 09/24/18 1039   levalbuterol (XOPENEX) nebulizer solution 0.63 mg  0.63 mg Nebulization Q6H PRN Gilles Chiquito B, MD   0.63 mg at 09/24/18 1238   metoprolol  tartrate (LOPRESSOR) injection 2.5 mg  2.5 mg Intravenous Q6H PRN Ledora Bottcher, PA   2.5 mg at 09/23/18 1758   metoprolol tartrate (LOPRESSOR) injection 5 mg  5 mg Intravenous Once Chundi, Verne Spurr, MD   Stopped at 09/24/18 0514   ondansetron (ZOFRAN) tablet 4 mg  4 mg Oral Q6H PRN Neva Seat, MD       Or   ondansetron [facility]) injection 4 mg  4 mg Intravenous Q6H PRN Neva Seat, MD   4 mg at 09/19/18 0929   pantoprazole (PROTONIX) injection 40 mg  40 mg Intravenous Q24H Ina Homes, MD  40 mg at 09/24/18 1036   polyethylene glycol (MIRALAX / GLYCOLAX) packet 17 g  17 g Oral BID Neva Seat, MD   17 g at 09/21/18 2134   simethicone (MYLICON) chewable tablet 80 mg  80 mg Oral QID PRN Ina Homes, MD   80 mg at 09/23/18 2059   sodium chloride flush (NS) 0.9 % injection 10-40 mL  10-40 mL Intracatheter PRN Joni Reining C, DO       sodium chloride flush (NS) 0.9 % injection 3 mL  3 mL Intravenous Q12H Neva Seat, MD   3 mL at 09/23/18 2100    Allergies as of 09/29/2018 - Review Complete 09/27/2018  Allergen Reaction Noted   Diclofenac sodium Anaphylaxis 05/16/2011   Ibuprofen Anaphylaxis 05/16/2011   Lisinopril  05/16/2011   Adhesive [tape]  05/16/2011   Codeine Nausea Only 05/16/2011   Tizanidine  10/14/2014    Family History  Problem Relation Age of Onset   Ovarian cancer Mother    Pancreatic cancer Father    Diabetes Son        type 2   COPD Son        smoked   Other Son        laryngeal dysplasia   Emphysema Son    Heart disease Son    Colon cancer Neg Hx    Colon polyps Neg Hx    Esophageal cancer Neg Hx    Kidney disease Neg Hx    Liver disease Neg Hx     Social History   Socioeconomic History   Marital status: Widowed    Spouse name: Not on file   Number of children: Not on file   Years of education: Not on file   Highest education level: Not on file  Occupational History   Not on file    Social Needs   Financial resource strain: Not on file   Food insecurity    Worry: Not on file    Inability: Not on file   Transportation needs    Medical: Not on file    Non-medical: Not on file  Tobacco Use   Smoking status: Former Smoker    Years: 41.00    Types: Cigarettes    Quit date: 02/05/1996    Years since quitting: 22.6   Smokeless tobacco: Never Used  Substance and Sexual Activity   Alcohol use: No    Alcohol/week: 0.0 standard drinks   Drug use: No   Sexual activity: Never  Lifestyle   Physical activity    Days per week: Not on file    Minutes per session: Not on file   Stress: Not on file  Relationships   Social connections    Talks on phone: Not on file    Gets together: Not on file    Attends religious service: Not on file    Active member of club or organization: Not on file    Attends meetings of clubs or organizations: Not on file    Relationship status: Not on file   Intimate partner violence    Fear of current or ex partner: Not on file    Emotionally abused: Not on file    Physically abused: Not on file    Forced sexual activity: Not on file  Other Topics Concern   Not on file  Social History Narrative   Not on file    Review of Systems: All systems reviewed and negative except where noted in HPI.  Physical Exam:  Vital signs in last 24 hours: Temp:  [97.5 F (36.4 C)-98.2 F (36.8 C)] 97.5 F (36.4 C) (08/20 1224) Pulse Rate:  [63-179] 77 (08/20 1224) Resp:  [15-34] 22 (08/20 1224) BP: (123-151)/(68-132) 133/84 (08/20 1224) SpO2:  [90 %-100 %] 100 % (08/20 1224) Weight:  [93.6 kg] 93.6 kg (08/20 0404) Last BM Date: 09/23/18 General:   Alert, well-developed, female in NAD. Son Fraser Din at bedside Psych:  Pleasant, cooperative. Normal mood and affect. Eyes:  Pupils equal, sclera clear, no icterus.   Conjunctiva pink. Ears:  Normal auditory acuity. Nose:  No deformity, discharge,  or lesions. Neck:  Supple; no masses Lungs:   Bilateral wheezes / coarse breath sounds.  Heart:  Regular rate and rhythm, no lower extremity edema Abdomen:  Soft, distended, nontender, tympanitis.  NGT to wall suction so hard to interpret bowel sounds  Rectal:  Deferred  Msk:  Symmetrical without gross deformities. . Neurologic:  Alert and  oriented x4;  grossly normal neurologically. Skin:  Intact without significant lesions or rashes.   Intake/Output from previous day: 08/19 0701 - 08/20 0700 In: 372 [P.O.:372] Out: 900 [Emesis/NG output:900] Intake/Output this shift: Total I/O In: 0  Out: 700 [Urine:700]  Lab Results: Recent Labs    09/22/18 0323 09/23/18 0500 09/24/18 0415  WBC 17.1* 10.0 8.0  HGB 11.8* 11.7* 12.2  HCT 35.2* 36.5 38.6  PLT 236 243 260   BMET Recent Labs    09/22/18 0323 09/23/18 0500 09/24/18 0415  NA 132* 133* 131*  K 3.8 3.9 3.6  CL 95* 98 96*  CO2 26 25 23   GLUCOSE 129* 101* 99  BUN 29* 26* 21  CREATININE 1.31* 1.11* 0.98  CALCIUM 8.6* 8.3* 8.5*   LFT No results for input(s): PROT, ALBUMIN, AST, ALT, ALKPHOS, BILITOT, BILIDIR, IBILI in the last 72 hours. PT/INR No results for input(s): LABPROT, INR in the last 72 hours. Hepatitis Panel No results for input(s): HEPBSAG, HCVAB, HEPAIGM, HEPBIGM in the last 72 hours.   . CBC Latest Ref Rng & Units 09/24/2018 09/23/2018 09/22/2018  WBC 4.0 - 10.5 K/uL 8.0 10.0 17.1(H)  Hemoglobin 12.0 - 15.0 g/dL 12.2 11.7(L) 11.8(L)  Hematocrit 36.0 - 46.0 % 38.6 36.5 35.2(L)  Platelets 150 - 400 K/uL 260 243 236    . CMP Latest Ref Rng & Units 09/24/2018 09/23/2018 09/22/2018  Glucose 70 - 99 mg/dL 99 101(H) 129(H)  BUN 8 - 23 mg/dL 21 26(H) 29(H)  Creatinine 0.44 - 1.00 mg/dL 0.98 1.11(H) 1.31(H)  Sodium 135 - 145 mmol/L 131(L) 133(L) 132(L)  Potassium 3.5 - 5.1 mmol/L 3.6 3.9 3.8  Chloride 98 - 111 mmol/L 96(L) 98 95(L)  CO2 22 - 32 mmol/L 23 25 26   Calcium 8.9 - 10.3 mg/dL 8.5(L) 8.3(L) 8.6(L)  Total Protein 6.5 - 8.1 g/dL - - -  Total  Bilirubin 0.3 - 1.2 mg/dL - - -  Alkaline Phos 38 - 126 U/L - - -  AST 15 - 41 U/L - - -  ALT 0 - 44 U/L - - -   Studies/Results: Dg Chest 2 View  Result Date: 09/23/2018 CLINICAL DATA:  Pneumonia EXAM: CHEST - 2 VIEW COMPARISON:  09/20/2018 FINDINGS: Interval removal of enteric tube. Stable cardiomediastinal silhouette. Densely calcified aorta. Small bilateral pleural effusions with associated bibasilar opacities. Aeration in the right lung base as improved from prior. No pneumothorax. IMPRESSION: 1. Improving aeration of the right lung base. 2. Persistent small bilateral pleural effusions with likely associated atelectasis. Electronically Signed  By: Davina Poke M.D.   On: 09/23/2018 10:20   Dg Abd 1 View  Result Date: 09/24/2018 CLINICAL DATA:  Abdominal distension EXAM: ABDOMEN - 1 VIEW COMPARISON:  09/20/2018 FINDINGS: Gaseous distention of stomach. Distended loops of bowel in the upper and lower abdomen, likely colonic, increased in prominence since prior study. Nasogastric tube no longer identified. No definite bowel wall thickening. Gas present in rectum. Bones demineralized with degenerative changes of the spine and note of a LEFT hip prosthesis. IMPRESSION: Increased gaseous distension of colon to rectum question colonic ileus. Mild gaseous distention of stomach with interval nasogastric tube removal. Electronically Signed   By: Lavonia Dana M.D.   On: 09/24/2018 08:36   Ct Abdomen Pelvis W Contrast  Result Date: 09/24/2018 CLINICAL DATA:  Abdominal distension. Evaluate for bowel obstruction. EXAM: CT ABDOMEN AND PELVIS WITH CONTRAST TECHNIQUE: Multidetector CT imaging of the abdomen and pelvis was performed using the standard protocol following bolus administration of intravenous contrast. CONTRAST:  1102mL OMNIPAQUE IOHEXOL 300 MG/ML  SOLN COMPARISON:  09/27/2014 FINDINGS: Lower chest: Small bilateral pleural effusions. Bibasilar atelectasis. Hepatobiliary: No focal liver abnormality  is seen.  Cholelithiasis. Pancreas: Unremarkable. No pancreatic ductal dilatation or surrounding inflammatory changes. Spleen: Normal in size without focal abnormality. Adrenals/Urinary Tract: No adrenal hemorrhage or renal injury identified. Small bilateral renal cyst. Bladder is unremarkable. Stomach/Bowel: Stomach is within normal limits. Appendix appears normal. No evidence of bowel wall thickening or inflammatory changes. Gaseous distention of the cecum measuring upto 10 cm. Gaseous distention of the ascending, transverse, and descending colon without a obstructing lesion. The appearance is concerning for a colonic ileus. Small bowel is decompressed. Vascular/Lymphatic: Normal caliber abdominal aorta with mild atherosclerosis. No lymphadenopathy. Reproductive: Uterus and bilateral adnexa are unremarkable. Other: No abdominal wall hernia or abnormality. No abdominopelvic ascites. Musculoskeletal: No acute or significant osseous findings. Chronic T11 compression fracture. IMPRESSION: 1. Gaseous distention of the cecum measuring upto 10 cm. Gaseous distention of the ascending, transverse, and descending colon without a obstructing lesion. The appearance is concerning for a colonic ileus. Small bowel is decompressed. 2. Small bilateral pleural effusions. 3. Cholelithiasis. 4.  Aortic Atherosclerosis (ICD10-I70.0). Electronically Signed   By: Kathreen Devoid   On: 09/24/2018 10:43    Principal Problem:   Atrial fibrillation with RVR ([place]) Active Problems:   Moderate aortic stenosis   Ileus (HCC)   Pleural effusion   Abdominal distention   Pneumonia    Tye Savoy, NP-C @  09/24/2018, 1:30 PM  GI ATTENDING  History, laboratories, x-rays reviewed.  Patient personally seen and examined.  Her son Saralyn Pilar is at the bedside.  We are asked to see the patient regarding abdominal distention.  She has a colonic ileus secondary to acute illness and immobilization on the background of chronic constipation.  NG  tube is in place.  On exam her abdomen is slightly distended with hypoactive but definite bowel sounds.  She does have some generalized tenderness with deep palpation.  No peritoneal signs.  In addition to increasing her activity (much as possible) and keeping her potassium in the normal range but above 4.0, we will recommend gentle tap water enema as well as rectal tube.  Repeat abdominal films in a.m.  GI will continue to follow.  Docia Chuck. Geri [place]in[place]., M.D. [facility] Division of Gastroenterology

## 2018-09-24 NOTE — Progress Notes (Signed)
Pt order for BIPAP is PRN. Pt not in need of BIPAP at this time. Pt respiratory status is stable on [place] 3 Lpm with sats of 98%. RT will continue to monitor.

## 2018-09-24 NOTE — Progress Notes (Signed)
PT Cancellation Note  Patient Details Name: [name] MRN: [id number] DOB: 1925/02/02   Cancelled Treatment:    Reason Eval/Treat Not Completed: Patient declined, no reason specified - Upon arrival to room, RN +2 assisting pt with pericare, deferred PT. Pt also with tachycardia up to 160 bpm today. Will check back tomorrow.  Julien Girt, PT Acute Rehabilitation Services Pager [phone]  Office [place] 09/24/2018, 5:40 PM

## 2018-09-24 NOTE — Progress Notes (Signed)
ANTICOAGULATION CONSULT NOTE - Follow Up Consult  Pharmacy Consult for: resume IV heparin (Eliquis held) Indication: atrial fibrillation  Allergies  Allergen Reactions  . Diclofenac Sodium Anaphylaxis  . Ibuprofen Anaphylaxis  . Lisinopril     Fall hazard, dizzy  . Adhesive [Tape]   . Codeine Nausea Only  . Tizanidine     "knocked out cold"    Patient Measurements: Height: 5\' 6"  (167.6 cm) Weight: 206 lb 5.6 oz (93.6 kg) IBW/kg (Calculated) : 59.3 Heparin Dosing Weight: 80 kg  Vital Signs: Temp: 97.5 F (36.4 C) (08/20 0757) Temp Source: Oral (08/20 0757) BP: 127/75 (08/20 0757) Pulse Rate: 109 (08/20 0758)  Labs: Recent Labs    09/22/18 0323 09/23/18 0500 09/24/18 0415  HGB 11.8* 11.7* 12.2  HCT 35.2* 36.5 38.6  PLT 236 243 260  HEPARINUNFRC 0.58  --   --   CREATININE 1.31* 1.11* 0.98    Estimated Creatinine Clearance: 43.1 mL/min (by C-G formula based on SCr of 0.98 mg/dL).  Assessment:  85 yr old female with new atrial fibrillation this admit. Received IV heparin 8/14>>8/18, when changed to Eliquis 5 mg PO BID.  Meds back to IV today due to ileus.  Last Eliquis dose 8/19 ~9pm.   Previously therapeutic on heparin at 1150 units/hr.  Will need to use aPTTs for heparin monitoring, while heparin levels expected to be falsely elevated due to recent Eliquis doses.  Goal of Therapy:  Heparin level 0.3-0.7 units/ml aPTT 66-102 seconds Monitor platelets by anticoagulation protocol: Yes   Plan:   Resume heparin drip at 1150 units/hr.    aPTT ~8 hrs after drip resumes.  Daily aPTT, heparin level and CBC.  Arty Baumgartner, [place] Pager: [phone] or phone: [phone] 09/24/2018,11:50 AM

## 2018-09-24 NOTE — Progress Notes (Signed)
Updated son Saralyn Pilar) about pt's night on the phone

## 2018-09-24 NOTE — Progress Notes (Addendum)
Progress Note  Patient Name: [name] Date of Encounter: 09/24/2018  Primary Cardiologist: Shelva Majestic, MD   Subjective   Mild dyspnea; Abdominal pain improving; no chest pain  Inpatient Medications    Scheduled Meds: . apixaban  5 mg Oral BID  . azithromycin  250 mg Oral Daily  . diltiazem  360 mg Oral Daily  . furosemide  20 mg Oral Daily  . metoprolol tartrate  5 mg Intravenous Once  . metoprolol tartrate  12.5 mg Oral BID  . pantoprazole (PROTONIX) IV  40 mg Intravenous Q24H  . polyethylene glycol  17 g Oral BID  . sodium chloride flush  3 mL Intravenous Q12H   Continuous Infusions: . cefTRIAXone (ROCEPHIN)  IV 1 g (09/23/18 2202)  . potassium chloride 10 mEq (09/24/18 0950)   PRN Meds: acetaminophen **OR** acetaminophen, levalbuterol, metoprolol tartrate, ondansetron **OR** ondansetron (ZOFRAN) IV, simethicone, sodium chloride flush   Vital Signs    Vitals:   09/24/18 0404 09/24/18 0540 09/24/18 0757 09/24/18 0758  BP: 129/77  127/75   Pulse: (!) 106 63 72 (!) 109  Resp: 15 18 (!) 22 (!) 21  Temp: 97.9 F (36.6 C)  (!) 97.5 F (36.4 C)   TempSrc: Oral  Oral   SpO2: 99% 97% 94% 99%  Weight: 93.6 kg     Height:        Intake/Output Summary (Last 24 hours) at 09/24/2018 0952 Last data filed at 09/24/2018 0130 Gross per 24 hour  Intake 360 ml  Output 900 ml  Net -540 ml   Last 3 Weights 09/24/2018 09/23/2018 09/21/2018  Weight (lbs) 206 lb 5.6 oz 210 lb 5.1 oz 208 lb 5.4 oz  Weight (kg) 93.6 kg 95.4 kg 94.5 kg      Telemetry    Atrial fibrillation rate elevated- Personally Reviewed  Physical Exam   GEN: WD frail NG tube in place Neck: supple, no JVD Cardiac: irregular and tachycardic, 2/6 systolic murmur LSB Respiratory: CTA anteriorly  GI: Soft, mildly distended; mild diffuse tenderness to palpation, no rebound, no masses MS: No edema Neuro:  No focal findings  Labs    High Sensitivity Troponin:   Recent Labs  Lab 10/04/2018 1015  09/23/2018 1235 09/21/18 0407 09/21/18 0923 09/21/18 1107  TROPONINIHS 45* 48* 14 11 13       Chemistry Recent Labs  Lab 09/14/2018 1015  09/22/18 0323 09/23/18 0500 09/24/18 0415  NA 136   < > 132* 133* 131*  K 4.3   < > 3.8 3.9 3.6  CL 102   < > 95* 98 96*  CO2 21*   < > 26 25 23   GLUCOSE 131*   < > 129* 101* 99  BUN 7*   < > 29* 26* 21  CREATININE 1.22*   < > 1.31* 1.11* 0.98  CALCIUM 9.0   < > 8.6* 8.3* 8.5*  PROT 6.7  --   --   --   --   ALBUMIN 3.5  --   --   --   --   AST 28  --   --   --   --   ALT 28  --   --   --   --   ALKPHOS 53  --   --   --   --   BILITOT 0.8  --   --   --   --   GFRNONAA 39*   < > 36* 43* 50*  GFRAA 45*   < >  41* 50* 58*  ANIONGAP 13   < > 11 10 12    < > = values in this interval not displayed.     Hematology Recent Labs  Lab 09/22/18 0323 09/23/18 0500 09/24/18 0415  WBC 17.1* 10.0 8.0  RBC 3.82* 3.84* 4.08  HGB 11.8* 11.7* 12.2  HCT 35.2* 36.5 38.6  MCV 92.1 95.1 94.6  MCH 30.9 30.5 29.9  MCHC 33.5 32.1 31.6  RDW 13.0 13.2 12.9  PLT 236 243 260    BNP Recent Labs  Lab 09/15/2018 1015  BNP 418.6*     Patient Profile     85 y.o. female with past medical history of hypertension, ileus, pericarditis admitted with atrial flutter with rapid ventricular response, increased dyspnea and chest pain.  Also with epigastric pain.  Echocardiogram shows normal LV function and moderate aortic stenosis.  Assessment & Plan   1 atrial fibrillation-patient remains in atrial fibrillation.  IV Cardizem was resumed as patient is not tolerating oral medications because of colonic ileus.  Apixaban was also changed to IV heparin.  We will continue with these medications until she is tolerating p.o.  We will then transition back to p.o. Cardizem and apixaban. We can plan cardioversion in the future if atrial fibrillation persists.  However as outlined previously I do not think she would hold sinus rhythm until acute illness improves including ileus.   Note echocardiogram shows normal LV function.    2 chest pain-No recurrent CP.  3 moderate aortic stenosis-will need fu echos in the future.  4 abdominal pain/ileus/possible pneumonia-per IM.  For questions or updates, please contact [place] Please consult [email or web address] for contact info under        Signed, Kirk Ruths, MD  09/24/2018, 9:52 AM

## 2018-09-24 NOTE — Evaluation (Signed)
SLP Cancellation Note  Patient Details Name: MAHAYLA HADDAWAY MRN: [id number] DOB: April 16, 1924   Cancelled treatment:       Reason Eval/Treat Not Completed: Medical issues which prohibited therapy(patient has NG in place, will defer swallow eval until may have po)   Macario Golds 09/24/2018, 8:59 AM   Luanna Salk, [place] SLP [place] Pager [phone] Office [phone]

## 2018-09-25 ENCOUNTER — Inpatient Hospital Stay (HOSPITAL_COMMUNITY): Payer: Medicare Other

## 2018-09-25 ENCOUNTER — Encounter (HOSPITAL_COMMUNITY): Payer: Self-pay

## 2018-09-25 DIAGNOSIS — Z978 Presence of other specified devices: Secondary | ICD-10-CM

## 2018-09-25 LAB — BASIC METABOLIC PANEL
Anion gap: 9 (ref 5–15)
BUN: 12 mg/dL (ref 8–23)
CO2: 24 mmol/L (ref 22–32)
Calcium: 8.3 mg/dL — ABNORMAL LOW (ref 8.9–10.3)
Chloride: 99 mmol/L (ref 98–111)
Creatinine, Ser: 0.85 mg/dL (ref 0.44–1.00)
GFR calc Af Amer: >60 mL/min (ref >60)
GFR calc non Af Amer: 60 mL/min — ABNORMAL LOW (ref >60)
Glucose, Bld: 110 mg/dL — ABNORMAL HIGH (ref 70–99)
Potassium: 4.2 mmol/L (ref 3.5–5.1)
Sodium: 132 mmol/L — ABNORMAL LOW (ref 135–145)

## 2018-09-25 LAB — CBC
HCT: 39.7 % (ref 36.0–46.0)
Hemoglobin: 12.9 g/dL (ref 12.0–15.0)
MCH: 30 pg (ref 26.0–34.0)
MCHC: 32.5 g/dL (ref 30.0–36.0)
MCV: 92.3 fL (ref 80.0–100.0)
Platelets: 286 10*3/uL (ref 150–400)
RBC: 4.3 MIL/uL (ref 3.87–5.11)
RDW: 12.8 % (ref 11.5–15.5)
WBC: 9 10*3/uL (ref 4.0–10.5)
nRBC: 0 % (ref 0.0–0.2)

## 2018-09-25 LAB — HEPARIN LEVEL (UNFRACTIONATED): Heparin Unfractionated: 1.2 IU/mL — ABNORMAL HIGH (ref 0.30–0.70)

## 2018-09-25 LAB — APTT: aPTT: 94 seconds — ABNORMAL HIGH (ref 24–36)

## 2018-09-25 MED ORDER — POTASSIUM CHLORIDE 10 MEQ/100ML IV SOLN
10.0000 meq | INTRAVENOUS | Status: AC
  Administered 2018-09-25 (×4): 10 meq via INTRAVENOUS
  Filled 2018-09-25 (×4): qty 100

## 2018-09-25 NOTE — Progress Notes (Signed)
Pt working with PT and NG tube fell out. Tye Savoy, NP paged and RN instructed that NG tube can stay out for now, but to place again if pt abdomen becomes distended. Will continue to monitor.  Clyde Canterbury, RN

## 2018-09-25 NOTE — Progress Notes (Signed)
NG tube advanced 6cm per verbal order from Tye Savoy, NP.  Clyde Canterbury, RN

## 2018-09-25 NOTE — Progress Notes (Signed)
SLP Cancellation Note  Patient Details Name: ZENAB HELMREICH MRN: [id number] DOB: Nov 15, 1924   Cancelled treatment:       Reason Eval/Treat Not Completed: Medical issues which prohibited therapy - pt remains NPO with NGT due to GI issues. Will f/u for swallow evaluation when medically able to resume POs.    Venita Sheffield Raybon Conard 09/25/2018, 9:26 AM  Pollyann Glen, M.A. [place] Acute Environmental education officer [phone] Office [phone]

## 2018-09-25 NOTE — Progress Notes (Signed)
Subjective:   [name] states she feels a little bit better today.  After the enema yesterday, her stomach felt a bit better.  Otherwise she feels about the same.  No new complaints at this time.  Objective:  Vital signs in last 24 hours: Vitals:   09/24/18 1634 09/24/18 2008 09/25/18 0037 09/25/18 0427  BP: (!) 133/105 131/73 (!) 147/54 140/80  Pulse: (!) 165 93 80 98  Resp: (!) 22 20 (!) 27 (!) 23  Temp: 97.8 F (36.6 C) 97.6 F (36.4 C) 97.6 F (36.4 C) (!) 97.4 F (36.3 C)  TempSrc: Oral Oral Oral Oral  SpO2: 97% 97% 99% 99%  Weight:    94.9 kg  Height:       Physical Exam Vitals signs and nursing note reviewed.  Constitutional:      General: She is not in acute distress.    Appearance: She is obese.  Cardiovascular:     Rate and Rhythm: Tachycardia present. Rhythm irregular.  Pulmonary:     Effort: No accessory muscle usage or respiratory distress.     Breath sounds: Wheezes: diffuse, mild. Rales: Left more prominent than right.  Abdominal:     General: Bowel sounds are decreased. There is distension.     Palpations: Abdomen is soft.     Tenderness: There is abdominal tenderness in the right upper quadrant and right lower quadrant.  Skin:    General: Skin is warm and dry.  Neurological:     General: No focal deficit present.     Mental Status: She is alert and oriented to person, place, and time.    Assessment/Plan:  Principal Problem:   Atrial fibrillation with RVR (HCC) Active Problems:   Moderate aortic stenosis   Ileus (HCC)   Pleural effusion   Abdominal distention   Pneumonia  #Atrial fibrillation with atrial flutter CHADsVAC 5-6 with HASBLED of 1. Echocardiogramon 8/14with normal LVEF, no LAE/RAE, but moderate AS.   Patient has not converted back to sinus rhythm, but her heart rate has improved some.  Continues to be on IV Dilt.  Cardiology does not feel she will be able to sustain sinus rhythm and have no current plans for cardioversion.   - Cardiologyon board and we appreciate the recommendations - Replace K to maintain >4.0 and Mg >2.0 - Diltiazem IV   - Heparin per pharmacy - Metoprolol PRN for HR >120   #Ileus Repeat KUB on 8/15 did show a possible ileus.NG tube was placed temporarily for symptom relief on 8/16 and replaced overnight last night due to worsening abdominal pain and distention. Repeat xray demonstrated non-obstructive bowel gas patterns. CT abdomen/pelvis obtained due to decompensation clinically. No obstruction or inflammation but ileus affecting the cecum that is more than 10cm wide.   Patient received an enema last night and nursing note reports lots of stool afterwards.  Ms. Calton Golds complains that the enema was very painful but having some bowel movements did not help her abdominal pain.  KUB repeat this morning was unremarkable.   - Maintain K >4.0 - PRN zofran for nausea  - IV protonix 40 mg QD  - NPO for bowel rest - NG tube placed on suction for symptom relief - Rectal tube - GI on board and we appreciate their recommendations  #Acute Respiratory Failure: #Pneumonia: Chest xray (8/16) shows stable left atelectasis or pneumonia with new right basilar atelectasis or pneumonia with pleural effusion and progressive bronchitic changes. Febrile episodesand worsening respiratorydecompensationon8/16.   Patient's breathing  has begun to improve slightly, as she was able to be weaned from 4 L to 3 L nasal cannula.  Today she completed a 5-day course of azithromycin and ceftriaxone for CAP.  We will continue to monitor.  We will continue levalbuterol for shortness of breath as needed.  - Levalbuterol PRN  #Hypertension Cardiology discontinued Amlodipine yesterday. Her BP this morning was well controlled with her medications for atrial fib. Will continue to monitor.    Dispo: Anticipated dischargepending medical improvement.  Dr. Jose Persia Internal Medicine PGY-1  Pager:  [phone] 09/25/2018, 6:50 AM

## 2018-09-25 NOTE — Evaluation (Signed)
Physical Therapy Evaluation Patient Details Name: [name] MRN: [id number] DOB: May 18, 1924 Today's Date: 09/25/2018   History of Present Illness  85 y.o. female with past medical history of hypertension, ileus, pericarditis admitted with atrial flutter with rapid ventricular response, increased dyspnea and chest pain.  Also with epigastric pain.  Echocardiogram shows normal LV function and moderate aortic stenosis.  Clinical Impression  Patient received in bed, son, Fraser Din, present for evaluation. Son provided prior mobility status. Patient required mod assist for supine to sit. Once seated at edge of bed patient is able to maintain with close guard. Using B UE assist from son, while PT managed lines, patient is able to take a few steps from bed to recliner. Mod assist. Patient was then noted to have stool under her in recliner so had to assist her back to standing to check rectal tube. Min/mod assist for this with RW. Patient stood while being cleaned. Patient will benefit from continued skilled PT to address her weakness, decreased activity tolerance, decreased ambulation.     Follow Up Recommendations Home health PT;Supervision/Assistance - 24 hour    Equipment Recommendations  None recommended by PT    Recommendations for Other Services       Precautions / Restrictions Precautions Precautions: Fall Restrictions Weight Bearing Restrictions: No      Mobility  Bed Mobility Overal bed mobility: Needs Assistance Bed Mobility: Supine to Sit       Sit to supine: Mod assist;+2 for safety/equipment   General bed mobility comments: son assisted patient from supine to sit ( as per their usual way of doing it at home)  Transfers Overall transfer level: Needs assistance Equipment used: Rolling walker (2 wheeled) Transfers: Sit to/from Stand Sit to Stand: Min assist            Ambulation/Gait Ambulation/Gait assistance: Mod assist Gait Distance (Feet): 3 Feet Assistive  device: 2 person hand held assist Gait Pattern/deviations: Step-to pattern;Decreased step length - right;Decreased step length - left Gait velocity: decreased   General Gait Details: patient limited from bed to chair at this time due to lines, leads  Stairs            Wheelchair Mobility    Modified Rankin (Stroke Patients Only)       Balance Overall balance assessment: Needs assistance Sitting-balance support: Feet supported;Single extremity supported Sitting balance-Leahy Scale: Good     Standing balance support: Bilateral upper extremity supported Standing balance-Leahy Scale: Fair                               Pertinent Vitals/Pain Pain Assessment: Faces Faces Pain Scale: Hurts even more Pain Location: rectum, vaginal area, legs, NG tube Pain Descriptors / Indicators: Guarding;Grimacing;Discomfort;Sore Pain Intervention(s): Limited activity within patient's tolerance;Monitored during session;Repositioned    Home Living Family/patient expects to be discharged to:: Private residence Living Arrangements: Children Available Help at Discharge: Family;Available 24 hours/day Type of Home: House Home Access: Stairs to enter Entrance Stairs-Rails: Left Entrance Stairs-Number of Steps: 3 Home Layout: One level Home Equipment: Bedside commode;Shower seat;Walker - standard      Prior Function Level of Independence: Needs assistance   Gait / Transfers Assistance Needed: Pt used walker to stabilize then ambulated without use of AD  ADL's / Homemaking Assistance Needed: pt appears to be mostly independent, son assists with IADLs        Hand Dominance   Dominant Hand: Right  Extremity/Trunk Assessment   Upper Extremity Assessment Upper Extremity Assessment: Defer to OT evaluation    Lower Extremity Assessment Lower Extremity Assessment: Generalized weakness    Cervical / Trunk Assessment Cervical / Trunk Assessment: Kyphotic  Communication    Communication: No difficulties  Cognition Arousal/Alertness: Awake/alert Behavior During Therapy: WFL for tasks assessed/performed Overall Cognitive Status: Within Functional Limits for tasks assessed                                        General Comments      Exercises Other Exercises Other Exercises: B LE exercises: ap, laq x 10 reps   Assessment/Plan    PT Assessment Patient needs continued PT services  PT Problem List         PT Treatment Interventions Therapeutic activities;Gait training;Therapeutic exercise;Functional mobility training;Patient/family education    PT Goals (Current goals can be found in the Care Plan section)  Acute Rehab PT Goals Patient Stated Goal: to return home with son, decrease pain PT Goal Formulation: With patient/family Time For Goal Achievement: 10/09/18 Potential to Achieve Goals: Good    Frequency Min 2X/week   Barriers to discharge        Co-evaluation               AM-PAC PT "6 Clicks" Mobility  Outcome Measure Help needed turning from your back to your side while in a flat bed without using bedrails?: A Lot Help needed moving from lying on your back to sitting on the side of a flat bed without using bedrails?: A Lot Help needed moving to and from a bed to a chair (including a wheelchair)?: A Lot Help needed standing up from a chair using your arms (e.g., wheelchair or bedside chair)?: A Lot Help needed to walk in hospital room?: A Lot Help needed climbing 3-5 steps with a railing? : Total 6 Click Score: 11    End of Session Equipment Utilized During Treatment: Gait belt Activity Tolerance: Patient tolerated treatment well;Patient limited by pain;Patient limited by fatigue Patient left: in chair;with call bell/phone within reach;with family/visitor present Nurse Communication: Mobility status;Other (comment)(patient noted to have stool under her once in recliner) PT Visit Diagnosis: Muscle weakness  (generalized) (M62.81);Unsteadiness on feet (R26.81);Difficulty in walking, not elsewhere classified (R26.2);Other abnormalities of gait and mobility (R26.89);Pain Pain - Right/Left: (various locations) Pain - part of body: Leg(bottom, vaginal area, legs)    Time: [id number] PT Time Calculation (min) (ACUTE ONLY): 26 min   Charges:   PT Evaluation $PT Eval Moderate Complexity: 1 Mod PT Treatments $Gait Training: 8-22 mins        Kathrine Rieves, PT, GCS 09/25/18,2:12 PM

## 2018-09-25 NOTE — Progress Notes (Addendum)
Progress Note   Attending physician's note   I have taken an interval history, reviewed the chart and examined the patient. I agree with the Advanced Practitioner's note, impression and recommendations.   Colonic ileus, abdominal distention improving Continue NG tube with intermittent suction (will need to advance tip)and rectal tube Maintain Potassium greater than 4 and magnesium greater than 2 Turn in bed side to side every 4 hour and out of bed to chair if possible  Dr. Collene Mares is covering inpatient GI for [place] this weekend  K. Denzil Magnuson , MD [phone]     ASSESSMENT AND PLAN:   85 yo female with colonic ileus in setting of acute illness with immobilization and underlying chronic constipation. Still with multiple dilated bowel loops on today's KUB but belly a little softer on exam.  No significant stool output following tap water enema (mainly just expelled stool tinged water) and no output in rectal tube bag today --NGT tip at GEJ. I spoke with RN Orvil Feil, she will advance NGT 5-6 cm --K+ is now above 4 --She can have ice chips  --Can we mobilize her? At least to bedside chair?    SUBJECTIVE   No nausea. Has some RLQ discomfort.  Son Saralyn Pilar at bedside  OBJECTIVE:     Vital signs in last 24 hours: Temp:  [97.4 F (36.3 C)-97.8 F (36.6 C)] 97.7 F (36.5 C) (08/21 0800) Pulse Rate:  [75-165] 75 (08/21 0800) Resp:  [19-27] 19 (08/21 0800) BP: (104-147)/(54-105) 131/87 (08/21 0800) SpO2:  [97 %-100 %] 97 % (08/21 0800) Weight:  [94.9 kg] 94.9 kg (08/21 0427) Last BM Date: 09/24/18 General:   Alert, well-developed female in NAD EENT:  Normal hearing, non icteric sclera, conjunctive pink.  Heart:  Regular rate and rhythm;  No lower extremity edema   Pulm: Normal respiratory effort, lungs CTA bilaterally without wheezes or crackles. Abdomen:  Soft, mild-mod distended, tympanitis, mild RLQ tenderness. Bowel sounds c/w NGT suction.  Neurologic:  Alert and   oriented x4;  grossly normal neurologically. Psych:  Pleasant, cooperative.  Normal mood and affect.   Intake/Output from previous day: 08/20 0701 - 08/21 0700 In: 765.9 [I.V.:46.5; IV Piggyback:719.4] Out: 1575 [Urine:1250; Emesis/NG output:325] Intake/Output this shift: Total I/O In: -  Out: 50 [Emesis/NG output:50]  Lab Results: Recent Labs    09/23/18 0500 09/24/18 0415 09/25/18 0319  WBC 10.0 8.0 9.0  HGB 11.7* 12.2 12.9  HCT 36.5 38.6 39.7  PLT 243 260 286   BMET Recent Labs    09/24/18 0415 09/24/18 2230 09/25/18 0319  NA 131* 133* 132*  K 3.6 3.8 4.2  CL 96* 99 99  CO2 23 24 24   GLUCOSE 99 108* 110*  BUN 21 15 12   CREATININE 0.98 0.90 0.85  CALCIUM 8.5* 8.4* 8.3*    Ct Abdomen Pelvis W Contrast  Result Date: 09/24/2018 CLINICAL DATA:  Abdominal distension. Evaluate for bowel obstruction. EXAM: CT ABDOMEN AND PELVIS WITH CONTRAST TECHNIQUE: Multidetector CT imaging of the abdomen and pelvis was performed using the standard protocol following bolus administration of intravenous contrast. CONTRAST:  129mL OMNIPAQUE IOHEXOL 300 MG/ML  SOLN COMPARISON:  09/27/2014 FINDINGS: Lower chest: Small bilateral pleural effusions. Bibasilar atelectasis. Hepatobiliary: No focal liver abnormality is seen.  Cholelithiasis. Pancreas: Unremarkable. No pancreatic ductal dilatation or surrounding inflammatory changes. Spleen: Normal in size without focal abnormality. Adrenals/Urinary Tract: No adrenal hemorrhage or renal injury identified. Small bilateral renal cyst. Bladder is unremarkable. Stomach/Bowel: Stomach is within normal limits.  Appendix appears normal. No evidence of bowel wall thickening or inflammatory changes. Gaseous distention of the cecum measuring upto 10 cm. Gaseous distention of the ascending, transverse, and descending colon without a obstructing lesion. The appearance is concerning for a colonic ileus. Small bowel is decompressed. Vascular/Lymphatic: Normal caliber  abdominal aorta with mild atherosclerosis. No lymphadenopathy. Reproductive: Uterus and bilateral adnexa are unremarkable. Other: No abdominal wall hernia or abnormality. No abdominopelvic ascites. Musculoskeletal: No acute or significant osseous findings. Chronic T11 compression fracture. IMPRESSION: 1. Gaseous distention of the cecum measuring upto 10 cm. Gaseous distention of the ascending, transverse, and descending colon without a obstructing lesion. The appearance is concerning for a colonic ileus. Small bowel is decompressed. 2. Small bilateral pleural effusions. 3. Cholelithiasis. 4.  Aortic Atherosclerosis (ICD10-I70.0). Electronically Signed   By: Kathreen Devoid   On: 09/24/2018 10:43   Dg Abd Portable 1v  Result Date: 09/25/2018 CLINICAL DATA:  Abdominal pain with nausea EXAM: PORTABLE ABDOMEN - 1 VIEW COMPARISON:  September 23, 2018 FINDINGS: Nasogastric tube tip is in the proximal stomach with the side port at the gastroesophageal junction. There remain multiple loops of dilated bowel. A small amount of air is noted in the rectum. No free air evident. No air-fluid levels. There is consolidation in the medial left base. IMPRESSION: Nasogastric tube side port at the gastroesophageal junction. Advise advancing nasogastric tube 5-6 cm to insure that side-port as well as tube tip are well within the stomach. Dilated loops of bowel, likely indicative of ileus, remains. No free air. Consolidation medial left base. Electronically Signed   By: Lowella Grip III M.D.   On: 09/25/2018 08:18     Principal Problem:   Atrial fibrillation with RVR (HCC) Active Problems:   Moderate aortic stenosis   Ileus (HCC)   Pleural effusion   Abdominal distention   Pneumonia     LOS: 8 days   Tye Savoy ,NP 09/25/2018, 10:36 AM

## 2018-09-25 NOTE — Progress Notes (Signed)
ANTICOAGULATION CONSULT NOTE  Pharmacy Consult:  Heparin Indication: atrial fibrillation  Allergies  Allergen Reactions  . Diclofenac Sodium Anaphylaxis  . Ibuprofen Anaphylaxis  . Lisinopril     Fall hazard, dizzy  . Adhesive [Tape]   . Codeine Nausea Only  . Tizanidine     "knocked out cold"    Patient Measurements: Height: 5\' 6"  (167.6 cm) Weight: 209 lb 3.5 oz (94.9 kg) IBW/kg (Calculated) : 59.3 Heparin Dosing Weight: 80 kg  Vital Signs: Temp: 97.7 F (36.5 C) (08/21 0800) Temp Source: Oral (08/21 0800) BP: 131/87 (08/21 0800) Pulse Rate: 75 (08/21 0800)  Labs: Recent Labs    09/23/18 0500 09/24/18 0415 09/24/18 1859 09/24/18 2230 09/25/18 0318 09/25/18 0319  HGB 11.7* 12.2  --   --   --  12.9  HCT 36.5 38.6  --   --   --  39.7  PLT 243 260  --   --   --  286  APTT  --   --  76*  --  94*  --   HEPARINUNFRC  --   --   --   --   --  1.20*  CREATININE 1.11* 0.98  --  0.90  --  0.85    Estimated Creatinine Clearance: 50 mL/min (by C-G formula based on SCr of 0.85 mg/dL).  Assessment: 57 YOF with new atrial fibrillation this admit. Received IV heparin 8/14>>8/18, when changed to Eliquis 5 mg PO BID.  Meds back to IV on 09/24/18 due to ileus.  Last Eliquis dose 8/19 ~9pm.  Currently using aPTT to guide heparin dosing because Eliquis falsely elevates heparin levels.  APTT therapeutic, no bleeding reported.  Goal of Therapy:  Heparin level 0.3-0.7 units/ml aPTT 66-102 seconds Monitor platelets by anticoagulation protocol: Yes   Plan:  Continue heparin gtt at 1150 units/hr Daily aPTT, heparin level and CBC  Deni Berti D. Mina Marble, PharmD, BCPS, [place] 09/25/2018, 8:42 AM

## 2018-09-26 ENCOUNTER — Inpatient Hospital Stay (HOSPITAL_COMMUNITY): Payer: Medicare Other

## 2018-09-26 DIAGNOSIS — I4819 Other persistent atrial fibrillation: Principal | ICD-10-CM

## 2018-09-26 DIAGNOSIS — Z933 Colostomy status: Secondary | ICD-10-CM

## 2018-09-26 LAB — CULTURE, BLOOD (ROUTINE X 2)
Culture: NO GROWTH
Culture: NO GROWTH
Special Requests: ADEQUATE
Special Requests: ADEQUATE

## 2018-09-26 LAB — CBC
HCT: 37.6 % (ref 36.0–46.0)
Hemoglobin: 12.5 g/dL (ref 12.0–15.0)
MCH: 30.5 pg (ref 26.0–34.0)
MCHC: 33.2 g/dL (ref 30.0–36.0)
MCV: 91.7 fL (ref 80.0–100.0)
Platelets: 282 10*3/uL (ref 150–400)
RBC: 4.1 MIL/uL (ref 3.87–5.11)
RDW: 12.5 % (ref 11.5–15.5)
WBC: 8.9 10*3/uL (ref 4.0–10.5)
nRBC: 0 % (ref 0.0–0.2)

## 2018-09-26 LAB — APTT
aPTT: 111 seconds — ABNORMAL HIGH (ref 24–36)
aPTT: 73 seconds — ABNORMAL HIGH (ref 24–36)

## 2018-09-26 LAB — BASIC METABOLIC PANEL
Anion gap: 10 (ref 5–15)
BUN: 10 mg/dL (ref 8–23)
CO2: 23 mmol/L (ref 22–32)
Calcium: 8.4 mg/dL — ABNORMAL LOW (ref 8.9–10.3)
Chloride: 100 mmol/L (ref 98–111)
Creatinine, Ser: 0.88 mg/dL (ref 0.44–1.00)
GFR calc Af Amer: >60 mL/min (ref >60)
GFR calc non Af Amer: 57 mL/min — ABNORMAL LOW (ref >60)
Glucose, Bld: 106 mg/dL — ABNORMAL HIGH (ref 70–99)
Potassium: 3.8 mmol/L (ref 3.5–5.1)
Sodium: 133 mmol/L — ABNORMAL LOW (ref 135–145)

## 2018-09-26 LAB — MAGNESIUM: Magnesium: 2 mg/dL (ref 1.7–2.4)

## 2018-09-26 LAB — HEPARIN LEVEL (UNFRACTIONATED): Heparin Unfractionated: 0.97 IU/mL — ABNORMAL HIGH (ref 0.30–0.70)

## 2018-09-26 MED ORDER — POTASSIUM CHLORIDE 10 MEQ/100ML IV SOLN
10.0000 meq | INTRAVENOUS | Status: AC
  Administered 2018-09-26 (×4): 10 meq via INTRAVENOUS
  Filled 2018-09-26 (×2): qty 100

## 2018-09-26 MED ORDER — LIP MEDEX EX OINT
TOPICAL_OINTMENT | CUTANEOUS | Status: DC | PRN
  Filled 2018-09-26: qty 7

## 2018-09-26 NOTE — Plan of Care (Signed)
  Problem: Education: Goal: Knowledge of General Education information will improve Description: Including pain rating scale, medication(s)/side effects and non-pharmacologic comfort measures Outcome: Progressing   Problem: Health Behavior/Discharge Planning: Goal: Ability to manage health-related needs will improve Outcome: Progressing   Problem: Clinical Measurements: Goal: Will remain free from infection Outcome: Progressing Goal: Diagnostic test results will improve Outcome: Progressing Goal: Respiratory complications will improve Outcome: Progressing Goal: Cardiovascular complication will be avoided Outcome: Progressing   Problem: Coping: Goal: Level of anxiety will decrease Outcome: Progressing   Problem: Elimination: Goal: Will not experience complications related to bowel motility Outcome: Progressing Goal: Will not experience complications related to urinary retention Outcome: Progressing   Problem: Pain Managment: Goal: General experience of comfort will improve Outcome: Progressing   Problem: Safety: Goal: Ability to remain free from injury will improve Outcome: Progressing   Problem: Skin Integrity: Goal: Risk for impaired skin integrity will decrease Outcome: Progressing

## 2018-09-26 NOTE — Progress Notes (Signed)
ANTICOAGULATION CONSULT NOTE  Pharmacy Consult:  Heparin Indication: atrial fibrillation  Patient Measurements: Height: 5\' 6"  (167.6 cm) Weight: 206 lb 2.1 oz (93.5 kg) IBW/kg (Calculated) : 59.3 Heparin Dosing Weight: 80 kg  Vital Signs: Temp: 98.1 F (36.7 C) (08/22 1600) Temp Source: Oral (08/22 1600) BP: 130/73 (08/22 1600) Pulse Rate: 112 (08/22 1600)  Labs: Recent Labs    09/24/18 0415  09/24/18 2230 09/25/18 0318 09/25/18 0319 09/26/18 0443 09/26/18 1553  HGB 12.2  --   --   --  12.9 12.5  --   HCT 38.6  --   --   --  39.7 37.6  --   PLT 260  --   --   --  286 282  --   APTT  --    < >  --  94*  --  111* 73*  HEPARINUNFRC  --   --   --   --  1.20* 0.97*  --   CREATININE 0.98  --  0.90  --  0.85 0.88  --    < > = values in this interval not displayed.    Assessment: 30 YOF with new atrial fibrillation this admit. Received IV heparin 8/14>>8/18, when changed to Eliquis 5 mg PO BID.  Meds back to IV on 09/24/18 due to ileus.  Last Eliquis dose 8/19 around 9pm, would expect it to be washed out of system by now. Regardless, the heparin level and aPTT are both elevated. -aPTT= 73  Goal of Therapy:  Heparin level 0.3-0.7 units/ml aPTT 66-102 seconds Monitor platelets by anticoagulation protocol: Yes    Plan:  No heparin changes needed Daily aPTT, heparin level and CBC   Hildred Laser, PharmD Clinical Pharmacist **Pharmacist phone directory can now be found on [email or web address] ([email or web address]).  Listed under [place].

## 2018-09-26 NOTE — Progress Notes (Signed)
  Date: 09/26/2018  Patient name: [name]  Medical record number: [id number]  Date of birth: 1924/06/01   I have seen and evaluated this patient and I have discussed the plan of care with the house staff. Please see their note for complete details. I concur with their findings with the following additions/corrections:   Continues in persistent atrial fibrillation, but rate reasonably well controlled for right now on IV diltiazem and PRN IV metoprolol.  Unfortunately NG came out yesterday, no plan to replace at this time per her request.  Persistent ileus noted on a XR today, but she is having some stool output through her rectal tube.  We will continue bowel rest and hope her ileus resolves soon.  As noted by Dr. Charleen Kirks, if she does not have nutrition soon, we may need to consider TPN.  Lenice Pressman, M.D., Ph.D. 09/26/2018, 4:12 PM

## 2018-09-26 NOTE — Progress Notes (Signed)
Subjective:   [name] states she feels terrible this AM. She is having SHOB that woke her from sleep. She feels a little better now with the oxygen. She denies chest pain. She continues to have some abdominal discomfort but feels it is improving. She has not had any bowel movements since 8/20, although she did not realize she has a rectal tube placed.   Discussed that we are continuing to coordinate her care with cardiology and GI. We are continuing to monitor her electrolytes. All questions and concerns addressed.    Objective:  Vital signs in last 24 hours: Vitals:   09/25/18 2358 09/26/18 0000 09/26/18 0101 09/26/18 0423  BP: (!) 159/68 (!) 152/100 (!) 139/52 104/71  Pulse: 83 72 88 (!) 137  Resp: 18 (!) 22 (!) 21 18  Temp: 97.6 F (36.4 C)   98.5 F (36.9 C)  TempSrc: Oral   Oral  SpO2: 96% 95% 95% 100%  Weight:    93.5 kg  Height:       Physical Exam Vitals signs and nursing note reviewed.  Constitutional:      General: She is not in acute distress.    Appearance: She is obese.  Cardiovascular:     Rate and Rhythm: Tachycardia present. Rhythm irregular.     Heart sounds: Murmur (systolic, 2/6) present.  Pulmonary:     Effort: No accessory muscle usage or respiratory distress.     Breath sounds: Wheezing (diffuse, mild) present. No decreased breath sounds or rales.  Abdominal:     General: Bowel sounds are decreased. There is no distension.     Palpations: Abdomen is soft.     Tenderness: There is generalized abdominal tenderness and tenderness in the right upper quadrant and right lower quadrant.  Skin:    General: Skin is warm and dry.  Neurological:     General: No focal deficit present.     Mental Status: She is alert and oriented to person, place, and time.    Assessment/Plan:  Principal Problem:   Atrial fibrillation with RVR (HCC) Active Problems:   Moderate aortic stenosis   Ileus (HCC)   Pleural effusion   Abdominal distention   Pneumonia  Nasogastric tube present  #Atrial fibrillation with atrial flutter CHADsVAC 5-6 with HASBLED of 1. Echocardiogramon 8/14with normal LVEF, no LAE/RAE, but moderate AS.   Patient has not converted back to sinus rhythm. Cardiology still feels she will not be able to maintain sinus rhythm, so she is not a candidate at this time for cardioversion, possibly when ileus resolves though.  Heart rate fluctuates but averages in the high 90s.  - Cardiologyon board and we appreciate the recommendations - Replace K to maintain >4.0 and Mg >2.0 - Diltiazem IV   - Heparin per pharmacy - Metoprolol PRN for HR >120   #Ileus Repeat KUB on 8/15 did show a possible ileus.NG tube was placed temporarily for symptom relief on 8/16 and replaced overnight last night due to worsening abdominal pain and distention. Repeat xray demonstrated non-obstructive bowel gas patterns. CT abdomen/pelvis obtained due to decompensation clinically. No obstruction or inflammation but ileus affecting the cecum that is more than 10cm wide.   Symptomatically, [name] is improving slightly.  She reports that her abdominal pain is improved however her abdominal exam still elicits tenderness to palpation especially on the right side.  Her rectal pouch shows persistent stool output.  Repeat KUB today shows no significant improvement, per my reading.  We will continue  with current regimen.  If still n.p.o., eventually we will need to consider TPN.  - Maintain K >4.0 - PRN zofran for nausea  - IV protonix 40 mg QD  - NPO for bowel rest - Rectal tube - GI on board and we appreciate their recommendations  #Acute Respiratory Failure: #Pneumonia: Chest xray (8/16) shows stable left atelectasis or pneumonia with new right basilar atelectasis or pneumonia with pleural effusion and progressive bronchitic changes. Febrile episodesand worsening respiratorydecompensationon8/16.   [name] reports exacerbation of shortness of  breath overnight, however on examination today she is satting 100% on only 2 L, which is an improvement from yesterday at which time she was on 3 L.  She received a of albuterol treatment this morning around 6 AM.  She is still wheezing on examination, so we will continue levalbuterol treatments at this time.  - Levalbuterol PRN  #Hypertension Discontinued Amlodipine a few days after admission to soft BPs.  Pressure overnight fluctuated between lows at 104 and max of 152.  We will continue to monitor.    Dispo: Anticipated dischargepending medical improvement.  Dr. Jose Persia Internal Medicine PGY-1  Pager: [phone] 09/26/2018, 6:52 AM

## 2018-09-26 NOTE — Progress Notes (Signed)
ANTICOAGULATION CONSULT NOTE  Pharmacy Consult:  Heparin Indication: atrial fibrillation  Patient Measurements: Height: 5\' 6"  (167.6 cm) Weight: 206 lb 2.1 oz (93.5 kg) IBW/kg (Calculated) : 59.3 Heparin Dosing Weight: 80 kg  Vital Signs: Temp: 98.5 F (36.9 C) (08/22 0423) Temp Source: Oral (08/22 0423) BP: 104/71 (08/22 0423) Pulse Rate: 137 (08/22 0423)  Labs: Recent Labs    09/24/18 0415 09/24/18 1859 09/24/18 2230 09/25/18 0318 09/25/18 0319 09/26/18 0443  HGB 12.2  --   --   --  12.9 12.5  HCT 38.6  --   --   --  39.7 37.6  PLT 260  --   --   --  286 282  APTT  --  76*  --  94*  --  111*  HEPARINUNFRC  --   --   --   --  1.20* 0.97*  CREATININE 0.98  --  0.90  --  0.85 0.88    Assessment: 5 YOF with new atrial fibrillation this admit. Received IV heparin 8/14>>8/18, when changed to Eliquis 5 mg PO BID.  Meds back to IV on 09/24/18 due to ileus.  Last Eliquis dose 8/19 around 9pm, would expect it to be washed out of system by now. Regardless, the heparin level and aPTT are both elevated this morning. CBC stable no s/sx bleeding per RN.   Goal of Therapy:  Heparin level 0.3-0.7 units/ml aPTT 66-102 seconds Monitor platelets by anticoagulation protocol: Yes    Plan:  Reduce heparin to 1000 units/hr Check aPTT in 8 hours Daily aPTT, heparin level and CBC   MastersJake Church 09/26/2018, 5:43 AM

## 2018-09-26 NOTE — Progress Notes (Signed)
Progress Note  Patient Name: [name] Date of Encounter: 09/26/2018  Primary Cardiologist: Shelva Majestic, MD  Subjective   No palpitations or shortness of breath.  Feels weak.  No active abdominal pain.  No oral intake as yet.  Inpatient Medications    Scheduled Meds: . metoprolol tartrate  5 mg Intravenous Once  . pantoprazole (PROTONIX) IV  40 mg Intravenous Q24H  . polyethylene glycol  17 g Oral BID  . sodium chloride flush  3 mL Intravenous Q12H   Continuous Infusions: . diltiazem (CARDIZEM) infusion 15 mg/hr (09/26/18 0415)  . heparin 1,000 Units/hr (09/26/18 0601)  . potassium chloride     PRN Meds: acetaminophen **OR** acetaminophen, levalbuterol, metoprolol tartrate, ondansetron **OR** ondansetron (ZOFRAN) IV, simethicone, sodium chloride flush   Vital Signs    Vitals:   09/26/18 0000 09/26/18 0101 09/26/18 0423 09/26/18 0756  BP: (!) 152/100 (!) 139/52 104/71 (!) 142/77  Pulse: 72 88 (!) 137 85  Resp: (!) 22 (!) 21 18 18   Temp:   98.5 F (36.9 C) 98 F (36.7 C)  TempSrc:   Oral Oral  SpO2: 95% 95% 100% 99%  Weight:   93.5 kg   Height:        Intake/Output Summary (Last 24 hours) at 09/26/2018 1008 Last data filed at 09/25/2018 1630 Gross per 24 hour  Intake 0 ml  Output 150 ml  Net -150 ml   Filed Weights   09/24/18 0404 09/25/18 0427 09/26/18 0423  Weight: 93.6 kg 94.9 kg 93.5 kg    Telemetry    Atrial fibrillation.  Personally reviewed.  ECG    An ECG dated 09/21/2018 was personally reviewed today and demonstrated:  Atrial fibrillation with nonspecific ST changes.  Physical Exam   GEN:  Elderly woman.  No acute distress.   Neck: No JVD. Cardiac:  Irregularly irregular, no gallop.  Respiratory: Nonlabored. Clear to auscultation bilaterally. GI: Soft, nontender. MS: No edema; No deformity. Neuro:  Nonfocal. Psych: Alert and oriented x 3. Normal affect.  Labs    Chemistry Recent Labs  Lab 09/24/18 2230 09/25/18 0319 09/26/18  0443  NA 133* 132* 133*  K 3.8 4.2 3.8  CL 99 99 100  CO2 24 24 23   GLUCOSE 108* 110* 106*  BUN 15 12 10   CREATININE 0.90 0.85 0.88  CALCIUM 8.4* 8.3* 8.4*  GFRNONAA 56* 60* 57*  GFRAA >60 >60 >60  ANIONGAP 10 9 10      Hematology Recent Labs  Lab 09/24/18 0415 09/25/18 0319 09/26/18 0443  WBC 8.0 9.0 8.9  RBC 4.08 4.30 4.10  HGB 12.2 12.9 12.5  HCT 38.6 39.7 37.6  MCV 94.6 92.3 91.7  MCH 29.9 30.0 30.5  MCHC 31.6 32.5 33.2  RDW 12.9 12.8 12.5  PLT 260 286 282    Cardiac Enzymes Recent Labs  Lab 09/12/2018 1015 09/23/2018 1235 09/21/18 0407 09/21/18 0923 09/21/18 1107  TROPONINIHS 45* 48* 14 11 13     Radiology    Dg Abd Portable 1v  Result Date: 09/25/2018 CLINICAL DATA:  Abdominal pain with nausea EXAM: PORTABLE ABDOMEN - 1 VIEW COMPARISON:  September 23, 2018 FINDINGS: Nasogastric tube tip is in the proximal stomach with the side port at the gastroesophageal junction. There remain multiple loops of dilated bowel. A small amount of air is noted in the rectum. No free air evident. No air-fluid levels. There is consolidation in the medial left base. IMPRESSION: Nasogastric tube side port at the gastroesophageal junction. Advise advancing nasogastric  tube 5-6 cm to insure that side-port as well as tube tip are well within the stomach. Dilated loops of bowel, likely indicative of ileus, remains. No free air. Consolidation medial left base. Electronically Signed   By: Lowella Grip III M.D.   On: 09/25/2018 08:18    Cardiac Studies   Echocardiogram 09/18/2018: FINDINGS  Mitral Valve: There is mild mitral annular calcification present.  Aortic Valve: The aortic valve is tricuspid . Moderate thickening of the aortic valve. Severe calcifcation of the aortic valve. Aortic valve regurgitation was not visualized by color flow Doppler. There is Moderate stenosis of the aortic valve, with a  calculated valve area of 1.07 cm. Mild to moderate aortic annular calcification  noted. AV Mean Grad: 22.0 mmHg. LVOT/AV VTI ratio: 0.34. AV Vmax: 298.00 cm/s.   1. The left ventricle has normal systolic function, with an ejection fraction of 55-60%. The cavity size was normal. There is moderately increased left ventricular wall thickness. Left ventricular diastolic function could not be evaluated secondary to  atrial fibrillation.  2. The right ventricle has normal systolic function. The cavity was normal. There is no increase in right ventricular wall thickness. Right ventricular systolic pressure could not be assessed.  3. The mitral valve is grossly normal.  4. The tricuspid valve is grossly normal.  5. The aortic valve was not well visualized. Moderate calcification of the aortic valve. Aortic valve regurgitation was not assessed by color flow Doppler.  6. Very poor visualization of the AV. Cannot assess number of cusps. There is thickening and calcificaiton of the AV. By doppler there is mild to moderate AS with man AVG 1mmHg and Vmax 276cm/s.  7. The aorta is normal unless otherwise noted.  8. The inferior vena cava was dilated in size with <50% respiratory variability.  9. The interatrial septum appears to be lipomatous. 10. Recommend repeat limited echo o f the AV.  Patient Profile     85 y.o. female with past medical history of hypertension, ileus, pericarditis admitted with atrial flutter with rapid ventricular response, increased dyspnea and chest pain.  Also with epigastric pain and now ileus.  Echocardiogram shows normal LV function and moderate aortic stenosis.  Assessment & Plan    1.  Persistent atrial fibrillation.  As she is unable to take oral medications in the setting of ileus, she continues on IV Cardizem and heparin.  NG tube came out yesterday.  No active plans for cardioversion at this time.  2.  Moderate calcific aortic stenosis with LVEF 55 to 60% by recent echocardiogram.  3.  Pneumonia, management per primary team.  4.  Essential  hypertension, Norvasc discontinued.  Recent systolics ranging from [id number].  Continue IV diltiazem and heparin for now, also written for IV Lopressor if needed.  Once able to tolerate oral medications this can be adjusted.  Signed, Rozann Lesches, MD  09/26/2018, 10:08 AM

## 2018-09-26 NOTE — Progress Notes (Signed)
[facility] Subjective: [name] is a 85 year old white female admitted with colonic ileus in the setting of immobilization with chronic constipation.  She had a rectal tube and an NG tube placed for decompression but the NG tube apparently fell out.  Patient had small amount of liquid stool in the Flexi-Seal bag.. She is nauseated but has not vomited she did not want to have the NG tube feeding reinserted.  She gives a history having one small volume bowel movement yesterday but none today.  She has not been ambulating much and has not changed position in bed much either  Objective: Vital signs in last 24 hours: Temp:  [97.6 F (36.4 C)-98.5 F (36.9 C)] 98 F (36.7 C) (08/22 0756) Pulse Rate:  [68-145] 85 (08/22 0756) Resp:  [18-24] 18 (08/22 0756) BP: (104-159)/(52-100) 142/77 (08/22 0756) SpO2:  [94 %-100 %] 99 % (08/22 0756) Weight:  [93.5 kg] 93.5 kg (08/22 0423) Last BM Date: 09/24/18  Intake/Output from previous day: 08/21 0701 - 08/22 0700 In: 0  Out: 200 [Emesis/NG output:200] Intake/Output this shift: No intake/output data recorded.  General appearance: alert, cooperative, appears older than stated age, fatigued, no distress and morbidly obese Resp: clear to auscultation bilaterally Cardio: irregular rate and rhythm, S1, S2 normal, no murmur, click, rub or gallop GI: soft, mild diffuse tenderness on palpation with hypoactive bowel sounds,  no organomegaly or masses appreciated Extremities: extremities normal, atraumatic, no cyanosis or edema  Lab Results: Recent Labs    09/24/18 0415 09/25/18 0319 09/26/18 0443  WBC 8.0 9.0 8.9  HGB 12.2 12.9 12.5  HCT 38.6 39.7 37.6  PLT 260 286 282   BMET Recent Labs    09/24/18 2230 09/25/18 0319 09/26/18 0443  NA 133* 132* 133*  K 3.8 4.2 3.8  CL 99 99 100  CO2 24 24 23   GLUCOSE 108* 110* 106*  BUN 15 12 10   CREATININE 0.90 0.85 0.88  CALCIUM 8.4* 8.3* 8.4*    Studies/Results: Dg Abd Portable  1v  Result Date: 09/25/2018 CLINICAL DATA:  Abdominal pain with nausea EXAM: PORTABLE ABDOMEN - 1 VIEW COMPARISON:  September 23, 2018 FINDINGS: Nasogastric tube tip is in the proximal stomach with the side port at the gastroesophageal junction. There remain multiple loops of dilated bowel. A small amount of air is noted in the rectum. No free air evident. No air-fluid levels. There is consolidation in the medial left base. IMPRESSION: Nasogastric tube side port at the gastroesophageal junction. Advise advancing nasogastric tube 5-6 cm to insure that side-port as well as tube tip are well within the stomach. Dilated loops of bowel, likely indicative of ileus, remains. No free air. Consolidation medial left base. Electronically Signed   By: Lowella Grip III M.D.   On: 09/25/2018 08:18    Medications: I have reviewed the patient's current medications.  Assessment/Plan: 1) Colonic ileus seems to be improving slowly. Repeat films this morning showed mildly dilated loops of colon with no evidence of a small bowel obstruction.  It is important to continue to monitoring her electrolytes and correcting them as needed. 2) Chronic constipation on docusate and polyethylene glycol 3) Atrial fibrillation on IV Cardizem/moderate calcific aortic stenosis with LVEF of 55 to 60% by recent echo/Essential hypertension.     LOS: 9 days   [name] 09/26/2018, 8:05 AM

## 2018-09-27 LAB — BASIC METABOLIC PANEL
Anion gap: 15 (ref 5–15)
BUN: 9 mg/dL (ref 8–23)
CO2: 19 mmol/L — ABNORMAL LOW (ref 22–32)
Calcium: 9 mg/dL (ref 8.9–10.3)
Chloride: 100 mmol/L (ref 98–111)
Creatinine, Ser: 0.91 mg/dL (ref 0.44–1.00)
GFR calc Af Amer: >60 mL/min (ref >60)
GFR calc non Af Amer: 55 mL/min — ABNORMAL LOW (ref >60)
Glucose, Bld: 100 mg/dL — ABNORMAL HIGH (ref 70–99)
Potassium: 4.2 mmol/L (ref 3.5–5.1)
Sodium: 134 mmol/L — ABNORMAL LOW (ref 135–145)

## 2018-09-27 LAB — CBC
HCT: 40.9 % (ref 36.0–46.0)
Hemoglobin: 13.2 g/dL (ref 12.0–15.0)
MCH: 29.8 pg (ref 26.0–34.0)
MCHC: 32.3 g/dL (ref 30.0–36.0)
MCV: 92.3 fL (ref 80.0–100.0)
Platelets: 302 10*3/uL (ref 150–400)
RBC: 4.43 MIL/uL (ref 3.87–5.11)
RDW: 12.6 % (ref 11.5–15.5)
WBC: 12.3 10*3/uL — ABNORMAL HIGH (ref 4.0–10.5)
nRBC: 0 % (ref 0.0–0.2)

## 2018-09-27 LAB — APTT: aPTT: 64 seconds — ABNORMAL HIGH (ref 24–36)

## 2018-09-27 LAB — HEPARIN LEVEL (UNFRACTIONATED): Heparin Unfractionated: 0.42 IU/mL (ref 0.30–0.70)

## 2018-09-27 MED ORDER — CAPSAICIN 0.025 % EX CREA
TOPICAL_CREAM | Freq: Two times a day (BID) | CUTANEOUS | Status: DC | PRN
  Administered 2018-09-27: 03:00:00 via TOPICAL
  Filled 2018-09-27: qty 60

## 2018-09-27 NOTE — Progress Notes (Signed)
Subjective:   [name] continues to have abdominal pain. She states that it is only bad whenever she pushes on it. She is not had any bowel movements but states that she did passive gas yesterday. She is no longer having nausea. She would like to try to drink something today. Her shortness of breath has improved and she is requiring less oxygen. She is no longer having chest discomfort.  We discussed continuing to coordinate your care with G.I. and cardiology. She will get up out of bed today.  Objective:  Vital signs in last 24 hours: Vitals:   09/27/18 0311 09/27/18 0338 09/27/18 0727 09/27/18 0800  BP:  133/72 (!) 133/107 (!) 144/67  Pulse: 84 61  68  Resp: 19 19  20   Temp:  (!) 97.5 F (36.4 C) 98.2 F (36.8 C)   TempSrc:  Oral Oral   SpO2: 98% 96%  98%  Weight:  94.3 kg    Height:       General: Elderly female in no acute distress Pulm: Good air movement with minimal wheezing CV: Regular rate but irregular rhythm, systolic murmur Abdomen: Hypoactive bowel sounds, distended, tenderness to palpation   Assessment/Plan:  Principal Problem:   Atrial fibrillation with RVR (HCC) Active Problems:   Moderate aortic stenosis   Ileus (HCC)   Pleural effusion   Abdominal distention   Pneumonia   Nasogastric tube present  #Atrial fibrillation with atrial flutter CHADsVAC 5-6 with HASBLED of 1. Echocardiogramon 8/14with normal LVEF, no LAE/RAE, but moderate AS.   Patient has not converted back to sinus rhythm. Cardiology still feels she will not be able to maintain sinus rhythm, so she is not a candidate at this time for cardioversion, possibly when ileus resolves though.  Heart rate fluctuates but averages in the high 90s.  - Cardiologyon board and we appreciate the recommendations - Replace K to maintain >4.0 and Mg >2.0 - Diltiazem IV   - Heparin per pharmacy - Metoprolol PRN for HR >120   #Ileus Repeat KUB on 8/15 did show a possible ileus.NG tube was  placed temporarily for symptom relief on 8/16 and replaced overnight last night due to worsening abdominal pain and distention. Repeat xray demonstrated non-obstructive bowel gas patterns. CT abdomen/pelvis obtained due to decompensation clinically. No obstruction or inflammation but ileus affecting the cecum that is more than 10cm wide.   Symptomatically, [name] is improving slightly.  She reports that her abdominal pain is improved however her abdominal exam still elicits tenderness to palpation especially on the right side.  Her rectal pouch shows persistent stool output. We will continue with current regimen. Advance diet to clear liquids  - Maintain K >4.0 - PRN zofran for nausea  - IV protonix 40 mg QD  - Advance diet to clear liquids - Rectal tube - GI on board and we appreciate their recommendations  #Acute Respiratory Failure: #Pneumonia: Chest xray (8/16) shows stable left atelectasis or pneumonia with new right basilar atelectasis or pneumonia with pleural effusion and progressive bronchitic changes. Febrile episodesand worsening respiratorydecompensationon8/16.   [name] reports exacerbation of shortness of breath overnight, however on examination today she is satting 100% on only 2 L and was able to come off oxygen overnight. This is an improvement compared to days prior.  She is still wheezing on examination, so we will continue levalbuterol treatments at this time.  - Levalbuterol PRN  #Hypertension Discontinued Amlodipine a few days after admission to soft BPs.  Pressure overnight fluctuated  between 125/107 - 158/60.  We will continue to monitor.  Dispo: Anticipated dischargepending medical improvement.  Brandi Homes, MD  [facility] PGY3  Pager: [phone] 09/27/2018, 10:23 AM

## 2018-09-27 NOTE — Progress Notes (Signed)
[facility] Subjective: [name] is a 85 year old white female with a small bowel ileus; this is taken some time to resolve as she is not very mobile and has been laying in bed most of the time.  Her an NG incidentally fell out but she has a rectal tube in which has a small amount of stool in it after she received a dose of MiraLAX today she has had some abdominal pain on palpation but denies abdominal discomfort otherwise. She denies having any nausea vomiting.  Her son is at bedside and is very attentive to her complaints  Objective: Vital signs in last 24 hours: Temp:  [97.4 F (36.3 C)-98.2 F (36.8 C)] 98.2 F (36.8 C) (08/23 0727) Pulse Rate:  [60-159] 68 (08/23 0800) Resp:  [16-30] 20 (08/23 0800) BP: (125-158)/(60-125) 144/67 (08/23 0800) SpO2:  [96 %-100 %] 98 % (08/23 0800) Weight:  [94.3 kg] 94.3 kg (08/23 0338) Last BM Date: 09/26/18  Intake/Output from previous day: 08/22 0701 - 08/23 0700 In: 2059.3 [I.V.:1662.2; IV Piggyback:397] Out: 1100 [Urine:1100] Intake/Output this shift: No intake/output data recorded.  General appearance: alert, cooperative, appears older than stated age, fatigued, no distress and morbidly obese Resp: bilateral wheezing Cardio: irregular rate and rhythm, S1, S2 normal, no murmur, click, rub or gallop GI: soft, mild diffuse tenderness on palpation with hypoactive bowel sounds,  no organomegaly or masses appreciated Extremities: extremities normal, atraumatic, no   Lab Results: Recent Labs    09/25/18 0319 09/26/18 0443  WBC 9.0 8.9  HGB 12.9 12.5  HCT 39.7 37.6  PLT 286 282   BMET Recent Labs    09/24/18 2230 09/25/18 0319 09/26/18 0443  NA 133* 132* 133*  K 3.8 4.2 3.8  CL 99 99 100  CO2 24 24 23   GLUCOSE 108* 110* 106*  BUN 15 12 10   CREATININE 0.90 0.85 0.88  CALCIUM 8.4* 8.3* 8.4*    Studies/Results: Dg Abd 1 View  Result Date: 09/26/2018 CLINICAL DATA:  Follow-up EXAM: ABDOMEN - 1 VIEW COMPARISON:   09/25/2018 FINDINGS: Enteric tube no longer visualized. Mildly dilated loops of colon. No small bowel dilatation to suggest small bowel obstruction. Degenerative changes of the lumbar spine. IMPRESSION: Enteric tube no longer visualized. Mildly dilated loops of colon, possibly reflecting adynamic colonic ileus. No findings to suggest small bowel obstruction. Electronically Signed   By: Julian Hy M.D.   On: 09/26/2018 10:24   Medications: I have reviewed the patient's current medications.  Assessment/Plan: 1) Colonic ileus seems to be improving slowly. It is important to continue to monitoring her electrolytes and correcting them as needed. 2) Chronic constipation on docusate and polyethylene glycol 3) Atrial fibrillation on IV Cardizem/moderate calcific aortic stenosis with LVEF of 55 to 60% by recent echo/Essential hypertension.      LOS: 10 days   [name] 09/27/2018, 8:34 AM

## 2018-09-27 NOTE — Plan of Care (Signed)
  Problem: Education: Goal: Knowledge of General Education information will improve Description: Including pain rating scale, medication(s)/side effects and non-pharmacologic comfort measures Outcome: Progressing   Problem: Clinical Measurements: Goal: Will remain free from infection Outcome: Progressing Goal: Diagnostic test results will improve Outcome: Progressing Goal: Respiratory complications will improve Outcome: Progressing Goal: Cardiovascular complication will be avoided Outcome: Progressing   Problem: Activity: Goal: Risk for activity intolerance will decrease Outcome: Progressing   Problem: Nutrition: Goal: Adequate nutrition will be maintained Outcome: Progressing   Problem: Coping: Goal: Level of anxiety will decrease Outcome: Progressing   Problem: Elimination: Goal: Will not experience complications related to bowel motility Outcome: Progressing Goal: Will not experience complications related to urinary retention Outcome: Progressing   Problem: Pain Managment: Goal: General experience of comfort will improve Outcome: Progressing   Problem: Safety: Goal: Ability to remain free from injury will improve Outcome: Progressing   Problem: Skin Integrity: Goal: Risk for impaired skin integrity will decrease Outcome: Progressing   

## 2018-09-27 NOTE — Evaluation (Signed)
Clinical/Bedside Swallow Evaluation Patient Details  Name: [name] MRN: [id number] Date of Birth: 1924/04/30  Today's Date: 09/27/2018 Time: SLP Start Time (ACUTE ONLY): 1215 SLP Stop Time (ACUTE ONLY): 1240 SLP Time Calculation (min) (ACUTE ONLY): 25 min  Past Medical History:  Past Medical History:  Diagnosis Date  . Anaphylactic shock 1990  . Arthritis    cervical, spurs  . Back pain 05/16/2011  . Bowel obstruction ([place])   . Broken leg 1996  . Cancer (HCC)    basal cells on back, shoulder and nose  . Chicken pox as a child  . DOE (dyspnea on exertion) 05/16/2011  . Fainting spell    X 1 time, at home in hospital 3 days  . History of shingles   . Hyperlipidemia 06/19/2011  . Hypertension   . Hyponatremia 11/12/2011  . Measles as a child  . Mumps as a child  . Neck pain, chronic   . Pericarditis   . Preventative health care 05/16/2011  . Psoriasis    scalp  . Sacroiliac dysfunction 11/12/2011  . Seborrhea capitis   . Seborrheic keratosis   . UTI (lower urinary tract infection) 06/23/2011   Past Surgical History:  Past Surgical History:  Procedure Laterality Date  . back disc repair  1979   low back with LLE radiculopathy  . biopsy left leg  10-23-11   benign  . BREAST LUMPECTOMY Left 1947  . CATARACT EXTRACTION, BILATERAL    . FEMUR SURGERY Left   . TOTAL KNEE ARTHROPLASTY Bilateral 1990  . TUBAL LIGATION     HPI:  85 yo female with colonic ileus in setting of acute illness with immobilization and underlying chronic constipation.  Dx include afib, acute resp failure, HTN.  NG removed and pt started clear liquids today.  Pt's son present; describes a four-month hx of belching associated with PO intake.    Assessment / Plan / Recommendation Clinical Impression  Pt participated in limited clinical swallow assessment due to GI restrictions.  Oral mechanism exam is normal.  Pt is edentulous.  No focal CN deficits.  Consumption of water was unremarkable with adequate  oral attention and seal; brisk swallow response; no s/s of aspiration.  There was minimal eructation.  There were no concerns for an oropharyngeal dysphagia.  No further acute SLP f/u is needed - diet advancement per MD.  SLP to sign off.  SLP Visit Diagnosis: Dysphagia, unspecified (R13.10)    Aspiration Risk  No limitations    Diet Recommendation  clear liquids per MD       Other  Recommendations Oral Care Recommendations: Oral care BID   Follow up Recommendations None      Frequency and Duration            Prognosis        Swallow Study   General HPI: 85 yo female with colonic ileus in setting of acute illness with immobilization and underlying chronic constipation.  Dx include afib, acute resp failure, HTN.  NG removed and pt started clear liquids today.  Pt's on present; describes a four-month hx of belching associated with PO intake.  Previous Swallow Assessment: no Diet Prior to this Study: Thin liquids Temperature Spikes Noted: No Respiratory Status: Room air History of Recent Intubation: No Behavior/Cognition: Alert;Cooperative;Pleasant mood Oral Cavity Assessment: Within Functional Limits Oral Care Completed by SLP: No Oral Cavity - Dentition: Edentulous Vision: Functional for self-feeding Self-Feeding Abilities: Able to feed self Patient Positioning: Upright in bed Baseline Vocal  Quality: Normal Volitional Cough: Strong Volitional Swallow: Able to elicit    Oral/Motor/Sensory Function Overall Oral Motor/Sensory Function: Within functional limits   Ice Chips Ice chips: Within functional limits   Thin Liquid Thin Liquid: Within functional limits    Nectar Thick Nectar Thick Liquid: Not tested   Honey Thick Honey Thick Liquid: Not tested   Puree Puree: Not tested   Solid     Solid: Not tested      [name] 09/27/2018,1:45 PM  Estill Bamberg L. Tivis Ringer, [place] Office number [phone] Pager  [phone]

## 2018-09-27 NOTE — Progress Notes (Signed)
Pt son Saralyn Pilar called three times for patient update with no answer. Will try again at a later time. Will continue to monitor.

## 2018-09-27 NOTE — Progress Notes (Signed)
ANTICOAGULATION CONSULT NOTE  Pharmacy Consult:  Heparin Indication: atrial fibrillation  Patient Measurements: Height: 5\' 6"  (167.6 cm) Weight: 207 lb 14.3 oz (94.3 kg) IBW/kg (Calculated) : 59.3 Heparin Dosing Weight: 80 kg  Vital Signs: Temp: 98.1 F (36.7 C) (08/23 1146) Temp Source: Oral (08/23 0727) BP: 128/79 (08/23 1146) Pulse Rate: 78 (08/23 1146)  Labs: Recent Labs    09/25/18 0319 09/26/18 0443 09/26/18 1553 09/27/18 0831  HGB 12.9 12.5  --  13.2  HCT 39.7 37.6  --  40.9  PLT 286 282  --  302  APTT  --  111* 73* 64*  HEPARINUNFRC 1.20* 0.97*  --  0.42  CREATININE 0.85 0.88  --  0.91    Assessment: 65 YOF with new atrial fibrillation this admit. Received IV heparin 8/14>>8/18, when changed to Eliquis 5 mg PO BID.  Meds back to IV on 09/24/18 due to ileus.  Last Eliquis dose 8/19 around 9pm, would expect it to be washed out of system by now but aPTT and heparin level are not exactly corrolating. aPTT is 64 (slightly subtherapeutic) @ 0831 on 08/23. Heparin level is 0.42 (may be falsely elevated still) on 08/23 @ 0831.No issues with bleeding or infusion per RN. H/H and plates both wnls.  Goal of Therapy:  Heparin level 0.3-0.7 units/ml aPTT 66-102 seconds Monitor platelets by anticoagulation protocol: Yes    Plan:  Increase heparin rate slightly to 1,050 units/hr due to aPTT being slightly subtherapuetic Daily aPTT, heparin level and CBC Can discontinue daily aPTT monitoring once aPTT and heparin level correlates Continue to watch for signs/symptoms of bleeding   Sherren Kerns, PharmD PGY1 Acute Care Pharmacy Resident  **Pharmacist phone directory can now be found on [email or web address] ([email or web address]).  Listed under [place].

## 2018-09-27 NOTE — Progress Notes (Signed)
Progress Note  Patient Name: [name] Date of Encounter: 09/27/2018  Primary Cardiologist: Shelva Majestic, MD  Subjective   Feels weak.  No palpitations.  Has not moved her bowels as yet.  Plans to get up to chair today.  Inpatient Medications    Scheduled Meds:  metoprolol tartrate  5 mg Intravenous Once   pantoprazole (PROTONIX) IV  40 mg Intravenous Q24H   polyethylene glycol  17 g Oral BID   sodium chloride flush  3 mL Intravenous Q12H   Continuous Infusions:  diltiazem (CARDIZEM) infusion 15 mg/hr (09/27/18 0647)   heparin 1,000 Units/hr (09/27/18 0058)   PRN Meds: acetaminophen **OR** acetaminophen, capsaicin, levalbuterol, lip balm, metoprolol tartrate, ondansetron **OR** ondansetron (ZOFRAN) IV, simethicone, sodium chloride flush   Vital Signs    Vitals:   09/27/18 0311 09/27/18 0338 09/27/18 0727 09/27/18 0800  BP:  133/72 (!) 133/107 (!) 144/67  Pulse: 84 61  68  Resp: 19 19  20   Temp:  (!) 97.5 F (36.4 C) 98.2 F (36.8 C)   TempSrc:  Oral Oral   SpO2: 98% 96%  98%  Weight:  94.3 kg    Height:        Intake/Output Summary (Last 24 hours) at 09/27/2018 0857 Last data filed at 09/27/2018 0629 Gross per 24 hour  Intake 958 ml  Output 1100 ml  Net -142 ml   Filed Weights   09/25/18 0427 09/26/18 0423 09/27/18 0338  Weight: 94.9 kg 93.5 kg 94.3 kg    Telemetry    Atrial fibrillation/flutter.  Personally reviewed.  ECG    An ECG dated 09/21/2018 was personally reviewed today and demonstrated:  Atrial fibrillation with nonspecific ST changes.  Physical Exam   GEN:  Elderly woman.  No acute distress.   Neck: No JVD. Cardiac:  Irregularly irregular, no gallop.  Respiratory: Nonlabored. Clear to auscultation bilaterally. GI: Soft, decreased bowel sounds. MS: No edema; No deformity. Neuro:  Nonfocal. Psych: Alert and oriented x 3. Normal affect.  Labs    Chemistry Recent Labs  Lab 09/24/18 2230 09/25/18 0319 09/26/18 0443  NA  133* 132* 133*  K 3.8 4.2 3.8  CL 99 99 100  CO2 24 24 23   GLUCOSE 108* 110* 106*  BUN 15 12 10   CREATININE 0.90 0.85 0.88  CALCIUM 8.4* 8.3* 8.4*  GFRNONAA 56* 60* 57*  GFRAA >60 >60 >60  ANIONGAP 10 9 10      Hematology Recent Labs  Lab 09/24/18 0415 09/25/18 0319 09/26/18 0443  WBC 8.0 9.0 8.9  RBC 4.08 4.30 4.10  HGB 12.2 12.9 12.5  HCT 38.6 39.7 37.6  MCV 94.6 92.3 91.7  MCH 29.9 30.0 30.5  MCHC 31.6 32.5 33.2  RDW 12.9 12.8 12.5  PLT 260 286 282    Cardiac Enzymes Recent Labs  Lab 10/04/2018 1015 10/03/2018 1235 09/21/18 0407 09/21/18 0923 09/21/18 1107  TROPONINIHS 45* 48* 14 11 13     Radiology    Dg Abd 1 View  Result Date: 09/26/2018 CLINICAL DATA:  Follow-up EXAM: ABDOMEN - 1 VIEW COMPARISON:  09/25/2018 FINDINGS: Enteric tube no longer visualized. Mildly dilated loops of colon. No small bowel dilatation to suggest small bowel obstruction. Degenerative changes of the lumbar spine. IMPRESSION: Enteric tube no longer visualized. Mildly dilated loops of colon, possibly reflecting adynamic colonic ileus. No findings to suggest small bowel obstruction. Electronically Signed   By: Julian Hy M.D.   On: 09/26/2018 10:24    Cardiac Studies  Echocardiogram 09/18/2018: FINDINGS  Mitral Valve: There is mild mitral annular calcification present.  Aortic Valve: The aortic valve is tricuspid . Moderate thickening of the aortic valve. Severe calcifcation of the aortic valve. Aortic valve regurgitation was not visualized by color flow Doppler. There is Moderate stenosis of the aortic valve, with a  calculated valve area of 1.07 cm. Mild to moderate aortic annular calcification noted. AV Mean Grad: 22.0 mmHg. LVOT/AV VTI ratio: 0.34. AV Vmax: 298.00 cm/s.  1. The left ventricle has normal systolic function, with an ejection fraction of 55-60%. The cavity size was normal. There is moderately increased left ventricular wall thickness. Left ventricular  diastolic function could not be evaluated secondary to  atrial fibrillation. 2. The right ventricle has normal systolic function. The cavity was normal. There is no increase in right ventricular wall thickness. Right ventricular systolic pressure could not be assessed. 3. The mitral valve is grossly normal. 4. The tricuspid valve is grossly normal. 5. The aortic valve was not well visualized. Moderate calcification of the aortic valve. Aortic valve regurgitation was not assessed by color flow Doppler. 6. Very poor visualization of the AV. Cannot assess number of cusps. There is thickening and calcificaiton of the AV. By doppler there is mild to moderate AS with man AVG 78mmHg and Vmax 276cm/s. 7. The aorta is normal unless otherwise noted. 8. The inferior vena cava was dilated in size with <50% respiratory variability. 9. The interatrial septum appears to be lipomatous. 10. Recommend repeat limited echo o f the AV.  Patient Profile     85 y.o. female with past medical history of hypertension, ileus, pericarditis admitted with atrial flutter with rapid ventricular response, increased dyspnea and chest pain. Also with epigastric pain and now ileus. Echocardiogram shows normal LV function and moderate aortic stenosis.  Assessment & Plan    1.  Persistent atrial fibrillation/flutter.  She remains n.p.o. and at this time continues on IV diltiazem and heparin.  No plans for cardioversion at this point.  2.  Moderate calcific aortic stenosis with LVEF 55 to 60%.  3.  Pneumonia, ongoing management per primary team.  4.  Essential hypertension, Norvasc discontinued.  130s to 140s.  5.  Ileus.  Continue IV diltiazem and heparin until patient able to tolerate oral medications.  Signed, Rozann Lesches, MD  09/27/2018, 8:57 AM

## 2018-09-28 ENCOUNTER — Inpatient Hospital Stay (HOSPITAL_COMMUNITY): Payer: Medicare Other

## 2018-09-28 DIAGNOSIS — R0603 Acute respiratory distress: Secondary | ICD-10-CM

## 2018-09-28 DIAGNOSIS — J9601 Acute respiratory failure with hypoxia: Secondary | ICD-10-CM

## 2018-09-28 LAB — BASIC METABOLIC PANEL
Anion gap: 9 (ref 5–15)
BUN: 7 mg/dL — ABNORMAL LOW (ref 8–23)
CO2: 22 mmol/L (ref 22–32)
Calcium: 8.8 mg/dL — ABNORMAL LOW (ref 8.9–10.3)
Chloride: 99 mmol/L (ref 98–111)
Creatinine, Ser: 0.82 mg/dL (ref 0.44–1.00)
GFR calc Af Amer: >60 mL/min (ref >60)
GFR calc non Af Amer: >60 mL/min (ref >60)
Glucose, Bld: 118 mg/dL — ABNORMAL HIGH (ref 70–99)
Potassium: 3.3 mmol/L — ABNORMAL LOW (ref 3.5–5.1)
Sodium: 130 mmol/L — ABNORMAL LOW (ref 135–145)

## 2018-09-28 LAB — BLOOD GAS, ARTERIAL
Acid-base deficit: 1 mmol/L (ref 0.0–2.0)
Bicarbonate: 22.7 mmol/L (ref 20.0–28.0)
FIO2: 28
O2 Saturation: 95.3 %
Patient temperature: 98.6
pCO2 arterial: 35.1 mmHg (ref 32.0–48.0)
pH, Arterial: 7.427 (ref 7.350–7.450)
pO2, Arterial: 71.5 mmHg — ABNORMAL LOW (ref 83.0–108.0)

## 2018-09-28 LAB — APTT: aPTT: 72 seconds — ABNORMAL HIGH (ref 24–36)

## 2018-09-28 LAB — CBC
HCT: 37.5 % (ref 36.0–46.0)
Hemoglobin: 12.1 g/dL (ref 12.0–15.0)
MCH: 29.7 pg (ref 26.0–34.0)
MCHC: 32.3 g/dL (ref 30.0–36.0)
MCV: 91.9 fL (ref 80.0–100.0)
Platelets: 273 10*3/uL (ref 150–400)
RBC: 4.08 MIL/uL (ref 3.87–5.11)
RDW: 12.6 % (ref 11.5–15.5)
WBC: 9.6 10*3/uL (ref 4.0–10.5)
nRBC: 0 % (ref 0.0–0.2)

## 2018-09-28 LAB — MAGNESIUM: Magnesium: 1.7 mg/dL (ref 1.7–2.4)

## 2018-09-28 LAB — HEPARIN LEVEL (UNFRACTIONATED): Heparin Unfractionated: 0.27 IU/mL — ABNORMAL LOW (ref 0.30–0.70)

## 2018-09-28 MED ORDER — FUROSEMIDE 10 MG/ML IJ SOLN
40.0000 mg | Freq: Once | INTRAMUSCULAR | Status: AC
  Administered 2018-09-28: 11:00:00 40 mg via INTRAVENOUS

## 2018-09-28 MED ORDER — FUROSEMIDE 10 MG/ML IJ SOLN
40.0000 mg | Freq: Once | INTRAMUSCULAR | Status: AC
  Administered 2018-09-28: 40 mg via INTRAVENOUS
  Filled 2018-09-28: qty 4

## 2018-09-28 MED ORDER — FAMOTIDINE 20 MG PO TABS: 20.0000 mg | ORAL_TABLET | Freq: Two times a day (BID) | ORAL | Status: DC

## 2018-09-28 MED ORDER — POTASSIUM CHLORIDE 10 MEQ/100ML IV SOLN
10.0000 meq | INTRAVENOUS | Status: AC
  Administered 2018-09-28 (×4): 10 meq via INTRAVENOUS
  Filled 2018-09-28 (×4): qty 100

## 2018-09-28 MED ORDER — FAMOTIDINE IN NACL 20-0.9 MG/50ML-% IV SOLN
20.0000 mg | Freq: Two times a day (BID) | INTRAVENOUS | Status: DC
  Administered 2018-09-28 – 2018-09-29 (×4): 20 mg via INTRAVENOUS
  Filled 2018-09-28 (×5): qty 50

## 2018-09-28 MED ORDER — MAGNESIUM SULFATE 4 GM/100ML IV SOLN
4.0000 g | Freq: Once | INTRAVENOUS | Status: AC
  Administered 2018-09-28: 11:00:00 4 g via INTRAVENOUS
  Filled 2018-09-28: qty 100

## 2018-09-28 MED ORDER — FUROSEMIDE 10 MG/ML IJ SOLN
INTRAMUSCULAR | Status: AC
  Filled 2018-09-28: qty 4

## 2018-09-28 MED ORDER — POTASSIUM CHLORIDE CRYS ER 20 MEQ PO TBCR
40.0000 meq | EXTENDED_RELEASE_TABLET | Freq: Two times a day (BID) | ORAL | Status: DC
  Filled 2018-09-28: qty 2

## 2018-09-28 NOTE — Care Management Important Message (Signed)
Important Message  Patient Details  Name: MAEKAYLA NORBUT MRN: [id number] Date of Birth: 16-Aug-1924   Medicare Important Message Given:  Yes     Shelda Altes 09/28/2018, 1:03 PM

## 2018-09-28 NOTE — Progress Notes (Signed)
Called for rapid response. Patient SHOB and confused. ABG done. Placed pateint on BIPAP for respiratory support. No issues at this time.

## 2018-09-28 NOTE — Progress Notes (Addendum)
Daily Rounding Note  09/28/2018, 1:10 PM  LOS: 11 days   SUBJECTIVE:   Chief complaint: Colonic ileus Patient developed more respiratory distress and altered mental status overnight.  She is now on BiPAP. NG tube came out over the weekend and was not replaced.  She was tolerating clear liquids yesterday but with respiratory distress/BiPAP, she is n.p.o. Has about 400 cc of brown watery stool in the Flexi-Seal which is about the same volume as present last night.  OBJECTIVE:         Vital signs in last 24 hours:    Temp:  [97.5 F (36.4 C)-98.5 F (36.9 C)] 97.8 F (36.6 C) (08/24 0800) Pulse Rate:  [73-108] 91 (08/24 1200) Resp:  [13-23] 13 (08/24 1200) BP: (116-156)/(55-111) 116/55 (08/24 1200) SpO2:  [94 %-100 %] 100 % (08/24 1200) Weight:  [95.3 kg] 95.3 kg (08/24 0519) Last BM Date: 09/26/18 Filed Weights   09/26/18 0423 09/27/18 0338 09/28/18 0519  Weight: 93.5 kg 94.3 kg 95.3 kg   General: Ill looking.  On BiPAP. Heart: Irregularly irregular.  Rate in 80s. Chest: BiPAP in place.  Somewhat labored breathing. Abdomen: Moderately tense, moderately distended.  Bowel sounds absent.  No tinkling or tympanitic bowel sounds. Extremities: Nonpitting pedal, lower extremity edema Neuro/Psych: Patient is sleepy, not responding to my voice  Intake/Output from previous day: 08/23 0701 - 08/24 0700 In: 1015.6 [P.O.:776; I.V.:239.6] Out: 700 [Urine:250; Stool:450]  Intake/Output this shift: No intake/output data recorded.  Lab Results: Recent Labs    09/26/18 0443 09/27/18 0831 09/28/18 0253  WBC 8.9 12.3* 9.6  HGB 12.5 13.2 12.1  HCT 37.6 40.9 37.5  PLT 282 302 273   BMET Recent Labs    09/26/18 0443 09/27/18 0831 09/28/18 0253  NA 133* 134* 130*  K 3.8 4.2 3.3*  CL 100 100 99  CO2 23 19* 22  GLUCOSE 106* 100* 118*  BUN 10 9 7*  CREATININE 0.88 0.91 0.82  CALCIUM 8.4* 9.0 8.8*   LFT No results  for input(s): PROT, ALBUMIN, AST, ALT, ALKPHOS, BILITOT, BILIDIR, IBILI in the last 72 hours. PT/INR No results for input(s): LABPROT, INR in the last 72 hours. Hepatitis Panel No results for input(s): HEPBSAG, HCVAB, HEPAIGM, HEPBIGM in the last 72 hours.  Studies/Results: Dg Chest 1 View  Result Date: 09/28/2018 CLINICAL DATA:  Respiratory difficulty. Cough. EXAM: CHEST  1 VIEW COMPARISON:  Chest x-rays dated 09/23/2018 and 09/20/2018 and 09/18/2018 FINDINGS: There are persistent small bilateral pleural effusions. Heart size is within normal limits considering the AP portable technique. Pulmonary vascularity is within normal limits. Extensive aortic atherosclerosis. No discrete lung consolidation. No acute bone abnormality. IMPRESSION: Persistent small bilateral pleural effusions. Aortic atherosclerosis. No significant change. Electronically Signed   By: Lorriane Shire M.D.   On: 09/28/2018 11:04   Scheduled Meds: . furosemide  40 mg Intravenous Once  . metoprolol tartrate  5 mg Intravenous Once  . pantoprazole (PROTONIX) IV  40 mg Intravenous Q24H  . polyethylene glycol  17 g Oral BID  . sodium chloride flush  3 mL Intravenous Q12H   Continuous Infusions: . diltiazem (CARDIZEM) infusion 15 mg/hr (09/28/18 1102)  . heparin 1,200 Units/hr (09/28/18 0514)  . potassium chloride 10 mEq (09/28/18 1308)   PRN Meds:.acetaminophen **OR** acetaminophen, capsaicin, levalbuterol, lip balm, metoprolol tartrate, ondansetron **OR** ondansetron (ZOFRAN) IV, simethicone, sodium chloride flush   ASSESMENT:   *     Diffuse ileus in setting  of acute illness.  Intermittent nausea, vomiting.  Patient has chronic constipation.  Required NG tube placement 8/19.  Water enema 8/20 without significant stool output, rectal tube placement 8/20.  1 small BM 8/21. Last/latest KUB 8/22 showing mildly dilated loops of colon, possibly reflecting adynamic colonic ileus.  No obstructive pattern. Meds include MiraLAX  twice daily No NG tube output recorded since 8/22.  *   A. fib/flutter with RVR.  Moderate aortic stenosis.  TEE On heparin. Pneumonia, pleural effusion requiring periodic BiPAP.  *     Hypokalemia.  Runs of IV potassium ordered.   RECOMMENDATION   *      Recheck KUB.  If she has significant ileus indicating need to replace the NG tube, this could be problematic given that she is on BiPAP.   Azucena Freed  09/28/2018, 1:10 PM Phone [phone]     Attending Physician Note   I have taken an interval history, reviewed the chart and examined the patient. I agree with the Advanced Practitioner's note, impression and recommendations. Generalized abdominal pain with generalized tenderness and tympany. Long discussion at bedside with the patient's son who is her long term caregiver.   Colonic ileus in setting of PNA and pleural effusion with resp failure Chronic constipation KUB today: persistent colonic distention question ileus   Continue Miralax bid Hypokalemia - correct per primary service, ideal to keep K > 4, Mg > 2 in this setting Flush rectal tube and continue rectal tube Change famotidine to IV Resume NG to LIS if feasible with BiPAP or change from BiPAP, defer to primary service   Lucio Edward, MD [facility]

## 2018-09-28 NOTE — Progress Notes (Signed)
Patient resting quietly in bed with eyes closed, continues on bipap, no sign of distress noted, patient's son at bedside, will continue to monitor.

## 2018-09-28 NOTE — Progress Notes (Signed)
Patient was noted to be having difficulty breathing with decreased LOC, wheezing sound auscultated on both lungs, O2 sats 99% on 2L, R 27  Xopenex 0.63mg  given, MD notified, RT notified, Rapid response RN notified, Phylliss Bob NP at bedside, see new orders, will continue to monitor patient.

## 2018-09-28 NOTE — Progress Notes (Signed)
Asked to assist with NGT insertion as prior ngt was difficult to insert. Pt very anxious with strong gag reflex. 16Fr NGT placed in L nare with somewhat difficulty d/t this. Positive air bolus heard by myself and pt's RN Sharrie Rothman. NGT secured to nose with securing device by Sharrie Rothman, RN. NGT placed to LIWS. Pt's SpO2 pre and post procedure 96-100%

## 2018-09-28 NOTE — Progress Notes (Signed)
Spoke with Dr. Fuller Plan from GI who recommends NG tube for her worsening abdominal pain and distention. Discussed that she is on Bipap at this time for respiratory distress, but will plan for NG tube once transitioned back to [place].   Please page IMTS when patient is off Bipap for NG tube orders.

## 2018-09-28 NOTE — Progress Notes (Signed)
Paged by nurse that patient is having respiratory distress, although HR and oxygen saturation was stable. Son at bedside requested we come and re-evaluate her and update him.   Upon arrival, Mrs. Preast son Saralyn Pilar was discussing goals of care with Dr. Marlou Porch. Saralyn Pilar notes that patient has a DNR/DNI order in her will, and they have discussed that if quality of life cannot be maintained, she would not want life prolonging measures. Saralyn Pilar agrees that the best next step would be to make Mrs. Codispoti DNI/DNR. He was re-assured that treatment would continue, including Bipap.   Discussed possible causes of current respiratory distress, pulmonary vs cardiac. IV Lasix 40mg  was ordered. Chest xray and ABG was obtained. Chest xray showed some possible right lower infiltrates, official reading still pending. She was already treated for aspiration pneumonia on admission with 5 days of Ceftriaxone and Azithromycin. ABG showed pH within normal limits, without hypercarbia or significant hypoxia. Patient was placed on Bipap and will evaluate to see if this helps with air hunger.

## 2018-09-28 NOTE — Progress Notes (Signed)
PT Cancellation Note  Patient Details Name: [name] MRN: [id number] DOB: 02-05-1924   Cancelled Treatment:    Reason Eval/Treat Not Completed: Medical issues which prohibited therapy; patient with decreased responsiveness and in respiratory distress.  Will attempt another day.   Reginia Naas 09/28/2018, 12:56 PM Magda Kiel, [place] [phone] 09/28/2018

## 2018-09-28 NOTE — Significant Event (Signed)
Rapid Response Event Note  Overview:  Called by bedside RN stating pt on 2L [place] with spO2 98-99% with family member concerned that pt is in distress.    Initial Focused Assessment: On arrival, pt with increased WOB, HR 97 afib, BP 155/74, RR 31, spO2 97% on 2L. +JVD, lungs diminished with some wheezing after neb treatment. Pt minimally responsive. Cardiology at bedside.   Interventions: CXR abg Bipap 40mg  Lasix Code Status changed to DNR  Plan of Care (if not transferred): Continue to monitor pt respiratory status. Keep on Bipap for increased WOB, follow up on urine output. Mentioned to internal medicine pt receiving electrolyte replacement along with other IV medications. May need an additional dose of Lasix d/t IVF receiving throughout day. Will follow up, bedside RN instructed to call with any changes or concerns.   Event Summary:  called at  1030   Event ended at  [address]

## 2018-09-28 NOTE — Progress Notes (Signed)
Subjective:   [name] is doing "bad." This AM. Her mouth is dry and she doesn't "wanna move." She doesn't know why she doesn't want to move. She feels diffusely sore. Her stomach doesn't feel much different today.   Discussed continuing to coordinate care with GI and cardiology. She voices understanding.   Objective:  Vital signs in last 24 hours: Vitals:   09/27/18 2042 09/27/18 2330 09/28/18 0408 09/28/18 0519  BP: (!) 131/58 (!) 141/98 (!) 145/66   Pulse: 97 88 80   Resp: 16 20 17 17   Temp: 97.8 F (36.6 C) 97.7 F (36.5 C) 97.8 F (36.6 C)   TempSrc: Oral Oral Oral   SpO2: 98% 98% 97%   Weight:    95.3 kg  Height:       Physical Exam Constitutional:      Appearance: She is obese.  Cardiovascular:     Rate and Rhythm: Normal rate. Rhythm irregular.     Heart sounds: Murmur (systolic, 2/6) present.  Pulmonary:     Effort: Pulmonary effort is normal. No accessory muscle usage.     Breath sounds: Normal breath sounds. No wheezing.  Abdominal:     General: Bowel sounds are normal. There is distension (consistent with prior exams).     Palpations: Abdomen is soft.     Tenderness: There is generalized abdominal tenderness. There is guarding (minimal, improved from prior exams).  Neurological:     Mental Status: She is alert.    Assessment/Plan:  Principal Problem:   Atrial fibrillation with RVR (HCC) Active Problems:   Moderate aortic stenosis   Ileus (HCC)   Pleural effusion   Abdominal distention   Pneumonia   Nasogastric tube present  #Atrial fibrillation with atrial flutter CHADsVAC 5-6 with HASBLED of 1. Echocardiogramon 8/14with normal LVEF, no LAE/RAE, but moderate AS.   Patient has not converted back to sinus rhythm. Due to ileus, not a candidate for cardioversion. Heart rate has remained within the 70-80s majority of time. Since she is on liquid diet now, will look into transitioning her IV diltiazem back to [place] board and we  appreciate the recommendations - Replace K to maintain >4.0 and Mg >2.0 - Diltiazem IV   - Heparin per pharmacy - Metoprolol PRN for HR >120   #Ileus Repeat KUB on 8/15 did show a possible ileus.NG tube was placed temporarily for symptom relief on 8/16 and replaced overnight last night due to worsening abdominal pain and distention. Repeat xray demonstrated non-obstructive bowel gas patterns. CT abdomen/pelvis obtained due to decompensation clinically. No obstruction or inflammation but ileus affecting the cecum that is more than 10cm wide.   [name] reports her abdominal pain is unchanged from yesterday. She continues to be tender on exam with some guarding, but improving. Her rectal tube is still in place with good stool output, although all liquid. She has been eating some food while NPO in the past week, as her son was feeding her. Will continue advancing diet as tolerated for now, unless she develops vomiting again. Replenished potassium and magnesium today, as both were low.   - Maintain K >4.0 - PRN zofran for nausea  - IV protonix 40 mg QD  - Advance diet to clear liquids - Rectal tube - GI on board and we appreciate their recommendations  #Acute Respiratory Failure: #Pneumonia: Chest xray (8/16) shows stable left atelectasis or pneumonia with new right basilar atelectasis or pneumonia with pleural effusion and progressive bronchitic  changes. Febrile episodesand worsening respiratorydecompensationon8/16.   Continues to wean down on oxygen requirement. Today, she is on 2L [place] and oxygen saturation was 100%.   - Levalbuterol PRN - Wean oxygen as tolerated.   #Hypertension Discontinued Amlodipine a few days after admission to soft BPs. Pressures were a bit high during exam today, systolic [id number]. Consider restarting some home meds. Will follow up cardiology's recs.     Dispo: Anticipated dischargepending medical improvement.  Dr. Jose Persia Internal Medicine  PGY-1  Pager: [phone] 09/28/2018, 6:58 AM

## 2018-09-28 NOTE — Progress Notes (Addendum)
ANTICOAGULATION CONSULT NOTE - Follow Up Consult  Pharmacy Consult for heparin Indication: atrial fibrillation  Labs: Recent Labs    09/26/18 0443 09/26/18 1553 09/27/18 0831 09/28/18 0253  HGB 12.5  --  13.2 12.1  HCT 37.6  --  40.9 37.5  PLT 282  --  302 273  APTT 111* 73* 64* 72*  HEPARINUNFRC 0.97*  --  0.42 0.27*  CREATININE 0.88  --  0.91 0.82    Assessment: 85yo female subtherapeutic on heparin despite rate increase; no gtt issues or signs of bleeding per RN.  Goal of Therapy:  Heparin level 0.3-0.7 units/ml   Plan:  Heparin to 1200 units / hr Follow up AM heparin level, CBC  Thank you Anette Guarneri, PharmD 09/28/2018

## 2018-09-28 NOTE — Progress Notes (Addendum)
Progress Note  Patient Name: [name] Date of Encounter: 09/28/2018  Primary Cardiologist: Shelva Majestic, MD   Subjective   Patient in respiratory distress upon my entering the room.  The nurse is administering a nebulizer treatment and has called respiratory therapy.  Lungs with scattered rhonchi, faint expiratory wheezes and diminished sounds, difficult to assess due to her grunting.  She does have jugular vein distention.  Oxygen saturation is maintaining in the upper 90s on 2 L via nasal cannula.  I have ordered Lasix 40 mg IV and a portable chest x-ray.  Patient's son is present and says that this is an acute change.  The patient is unable to give information.  Inpatient Medications    Scheduled Meds: . metoprolol tartrate  5 mg Intravenous Once  . pantoprazole (PROTONIX) IV  40 mg Intravenous Q24H  . polyethylene glycol  17 g Oral BID  . potassium chloride  40 mEq Oral BID  . sodium chloride flush  3 mL Intravenous Q12H   Continuous Infusions: . diltiazem (CARDIZEM) infusion 15 mg/hr (09/28/18 0324)  . heparin 1,200 Units/hr (09/28/18 0514)  . magnesium sulfate bolus IVPB     PRN Meds: acetaminophen **OR** acetaminophen, capsaicin, levalbuterol, lip balm, metoprolol tartrate, ondansetron **OR** ondansetron (ZOFRAN) IV, simethicone, sodium chloride flush   Vital Signs    Vitals:   09/27/18 2330 09/28/18 0408 09/28/18 0519 09/28/18 0800  BP: (!) 141/98 (!) 145/66  (!) 155/74  Pulse: 88 80    Resp: 20 17 17  (!) 22  Temp: 97.7 F (36.5 C) 97.8 F (36.6 C)  97.8 F (36.6 C)  TempSrc: Oral Oral  Oral  SpO2: 98% 97%  97%  Weight:   95.3 kg   Height:        Intake/Output Summary (Last 24 hours) at 09/28/2018 1002 Last data filed at 09/27/2018 2042 Gross per 24 hour  Intake 977.69 ml  Output 700 ml  Net 277.69 ml   Last 3 Weights 09/28/2018 09/27/2018 09/26/2018  Weight (lbs) 210 lb 1.6 oz 207 lb 14.3 oz 206 lb 2.1 oz  Weight (kg) 95.3 kg 94.3 kg 93.5 kg       Telemetry    Atrial fibrillation in the 80s- Personally Reviewed  ECG    No new tracings- Personally Reviewed  Physical Exam   GEN:  Patient is in some respiratory distress Neck: + JVD Cardiac:  Irregularly irregular rhythm Respiratory:  Scattered rhonchi, expiratory wheezes, diminished breath sounds GI: Soft, possibly distended, rectal tube in place with dark liquid stool MS: No edema; No deformity. Neuro:   Currently unable to assess, patient is not answering questions.  She makes brief eye contact. Psych: Agitated  Labs    High Sensitivity Troponin:   Recent Labs  Lab 09/16/2018 1015 09/16/2018 1235 09/21/18 0407 09/21/18 0923 09/21/18 1107  TROPONINIHS 45* 48* 14 11 13       Cardiac EnzymesNo results for input(s): TROPONINI in the last 168 hours. No results for input(s): TROPIPOC in the last 168 hours.   Chemistry Recent Labs  Lab 09/26/18 0443 09/27/18 0831 09/28/18 0253  NA 133* 134* 130*  K 3.8 4.2 3.3*  CL 100 100 99  CO2 23 19* 22  GLUCOSE 106* 100* 118*  BUN 10 9 7*  CREATININE 0.88 0.91 0.82  CALCIUM 8.4* 9.0 8.8*  GFRNONAA 57* 55* >60  GFRAA >60 >60 >60  ANIONGAP 10 15 9      Hematology Recent Labs  Lab 09/26/18 0443 09/27/18 0831  09/28/18 0253  WBC 8.9 12.3* 9.6  RBC 4.10 4.43 4.08  HGB 12.5 13.2 12.1  HCT 37.6 40.9 37.5  MCV 91.7 92.3 91.9  MCH 30.5 29.8 29.7  MCHC 33.2 32.3 32.3  RDW 12.5 12.6 12.6  PLT 282 302 273    BNPNo results for input(s): BNP, PROBNP in the last 168 hours.   DDimer No results for input(s): DDIMER in the last 168 hours.   Radiology    No results found.  Cardiac Studies   Echocardiogram 09/18/2018: FINDINGS  Mitral Valve: There is mild mitral annular calcification present.  Aortic Valve: The aortic valve is tricuspid . Moderate thickening of the aortic valve. Severe calcifcation of the aortic valve. Aortic valve regurgitation was not visualized by color flow Doppler. There is Moderate stenosis of the  aortic valve, with a  calculated valve area of 1.07 cm. Mild to moderate aortic annular calcification noted. AV Mean Grad: 22.0 mmHg. LVOT/AV VTI ratio: 0.34. AV Vmax: 298.00 cm/s.  1. The left ventricle has normal systolic function, with an ejection fraction of 55-60%. The cavity size was normal. There is moderately increased left ventricular wall thickness. Left ventricular diastolic function could not be evaluated secondary to  atrial fibrillation. 2. The right ventricle has normal systolic function. The cavity was normal. There is no increase in right ventricular wall thickness. Right ventricular systolic pressure could not be assessed. 3. The mitral valve is grossly normal. 4. The tricuspid valve is grossly normal. 5. The aortic valve was not well visualized. Moderate calcification of the aortic valve. Aortic valve regurgitation was not assessed by color flow Doppler. 6. Very poor visualization of the AV. Cannot assess number of cusps. There is thickening and calcificaiton of the AV. By doppler there is mild to moderate AS with man AVG 48mmHg and Vmax 276cm/s. 7. The aorta is normal unless otherwise noted. 8. The inferior vena cava was dilated in size with <50% respiratory variability. 9. The interatrial septum appears to be lipomatous. 10. Recommend repeat limited echo o f the AV.   Patient Profile     85 y.o. female with past medical history of hypertension, ileus, pericarditis admitted with atrial flutter with rapid ventricular response, increased dyspnea and chest pain. Also with epigastric painand now ileus. Echocardiogram shows normal LV function and moderate aortic stenosis.  Assessment & Plan    Persistent atrial fibrillation/flutter -Currently treated with IV diltiazem 15 mg/h.  Patient has been n.p.o., today starting on clear liquids.  Would plan to switch to oral dosing once it is evident that she is able to tolerate oral intake. -CHA2DS2/VAS Stroke Risk Score  is at least 4 (HTN, Age (2), female).  Patient is currently anticoagulated with IV heparin as she has been n.p.o.. -There has been no plan for cardioversion.  Respiratory distress -Patient in distress upon my entering the room.  Currently receiving a nebulizer treatment.  Patient with diminished breath sounds, expiratory wheezes.  Positive JVD.  Oxygen saturation is maintaining in the upper 90s on 2 L per nasal cannula.  I will give a dose of IV Lasix 40 mg and obtain a stat portable chest x-ray. -Nurse says that pt was alert and talking this morning with no respiratory difficulty.  -Unclear if this is pulmonary related situation or less likely cardiac related.  There is no change in her heart rates.  Question if she possibly aspirated oral fluids. She did pass swallow eval yesterday.   Hypokalemia -Serum potassium was 3.3.  K-Dur 40 mEq  x 2 doses ordered for today.---Patient is currently unable to take any oral.  I will change potassium to IV form. -Magnesium 1.7 today.  Magnesium sulfate 4 g IV ordered for today. -Patient having loose stools, rectal tube in place, likely losing electrolytes.  Aortic stenosis -Echocardiogram on 09/18/2018 showed moderate calcification of the aortic valve, no aortic regurgitation by color flow Doppler, Moderate stenosis with a  calculated valve area of 1.07 cm and AV Mean Grad: 22.0 mmHg. .  Essential hypertension -Amlodipine was discontinued a few days after admission for soft blood pressures.  -Blood pressure is now mildly elevated 130's-150's.  Would not resume amlodipine as she is on Cardizem for A. fib.   -Continue to monitor.   Pneumonia -Patient had fever and worsening respiratory decompensation on 8/16. -Improving, currently on oxygen at 2 L.  Ileus -Continues to have abdominal pain per notes. Pt is not answering questions at this time. -NG tube out. Diet is advanced to clear liquids.  Has rectal tube in place.      For questions or updates,  please contact [place] Please consult [email or web address] for contact info under        Signed, Daune Perch, NP  09/28/2018, 10:02 AM    Personally seen and examined. Agree with above.   Examined patient with son in room and care team in place including nursing and internal medicine team.  She was manifesting acute delirium, the appearance of shortness of breath, respiratory distress with increased respiratory rate and what appeared to be audible upper airway noise.  This all seemed to happen suddenly.  Questionable aspiration event possibly.  Chest x-ray personally reviewed did not show any evidence of fulminant pulmonary edema.  There may be some hazy right lower lobe opacification unchanged from prior.  Small bilateral effusions noted.  Heart rate on telemetry relatively stable with atrial fibrillation.  Blood gas emergently performed shows a pH of 7.44, PCO2 of 35, PO2 of 71.  Had a lengthy discussion with her son in combination with her care team.  If necessary, we can utilize BiPAP to help her through from a noninvasive ventilatory support system.  We also had discussion about CODE STATUS and he was aware of potential for DNR order.  After lengthy discussion, DNR order was mutually agreed upon.  His fear was that DNR would mean do not treat and that she would get the support that she needed if she were in respiratory distress.  We calmed those fears.  Our feeling is that intubation/ventilatory management with invasive approach ultimately with her multisystem organ failure, prolonged ileus, would not help her achieve the ultimate goal of return to her prior clinical status.  We have administered IV Lasix 40 mg. Nebulizers have been administered.  We will continue to closely follow with primary team and provide assistance as necessary.  CRITICAL CARE Performed by: Candee Furbish   Total critical care time: 40 minutes  Critical care time was exclusive of separately billable  procedures and treating other patients.  Critical care was necessary to treat or prevent imminent or life-threatening deterioration.  Critical care was time spent personally by me on the following activities: development of treatment plan with patient and/or surrogate as well as nursing, discussions with consultants, evaluation of patient's response to treatment, examination of patient, obtaining history from patient or surrogate, ordering and performing treatments and interventions, ordering and review of laboratory studies, ordering and review of radiographic studies, pulse oximetry and re-evaluation of patient's condition.  Candee Furbish,  MD

## 2018-09-29 ENCOUNTER — Inpatient Hospital Stay (HOSPITAL_COMMUNITY): Payer: Medicare Other

## 2018-09-29 LAB — BASIC METABOLIC PANEL
Anion gap: 11 (ref 5–15)
BUN: 7 mg/dL — ABNORMAL LOW (ref 8–23)
CO2: 24 mmol/L (ref 22–32)
Calcium: 8.6 mg/dL — ABNORMAL LOW (ref 8.9–10.3)
Chloride: 94 mmol/L — ABNORMAL LOW (ref 98–111)
Creatinine, Ser: 1.06 mg/dL — ABNORMAL HIGH (ref 0.44–1.00)
GFR calc Af Amer: 53 mL/min — ABNORMAL LOW (ref >60)
GFR calc non Af Amer: 46 mL/min — ABNORMAL LOW (ref >60)
Glucose, Bld: 109 mg/dL — ABNORMAL HIGH (ref 70–99)
Potassium: 3.9 mmol/L (ref 3.5–5.1)
Sodium: 129 mmol/L — ABNORMAL LOW (ref 135–145)

## 2018-09-29 LAB — CBC
HCT: 38.5 % (ref 36.0–46.0)
Hemoglobin: 12.7 g/dL (ref 12.0–15.0)
MCH: 30.2 pg (ref 26.0–34.0)
MCHC: 33 g/dL (ref 30.0–36.0)
MCV: 91.4 fL (ref 80.0–100.0)
Platelets: 304 10*3/uL (ref 150–400)
RBC: 4.21 MIL/uL (ref 3.87–5.11)
RDW: 12.5 % (ref 11.5–15.5)
WBC: 10 10*3/uL (ref 4.0–10.5)
nRBC: 0 % (ref 0.0–0.2)

## 2018-09-29 LAB — MAGNESIUM: Magnesium: 2 mg/dL (ref 1.7–2.4)

## 2018-09-29 LAB — HEPARIN LEVEL (UNFRACTIONATED): Heparin Unfractionated: 0.31 IU/mL (ref 0.30–0.70)

## 2018-09-29 MED ORDER — METRONIDAZOLE 0.75 % EX CREA
1.0000 "application " | TOPICAL_CREAM | CUTANEOUS | Status: DC | PRN
  Filled 2018-09-29: qty 45

## 2018-09-29 MED ORDER — POTASSIUM CHLORIDE 10 MEQ/100ML IV SOLN
10.0000 meq | INTRAVENOUS | Status: AC
  Administered 2018-09-29 (×2): 10 meq via INTRAVENOUS
  Filled 2018-09-29 (×2): qty 100

## 2018-09-29 MED ORDER — PHENOL 1.4 % MT LIQD
1.0000 | OROMUCOSAL | Status: DC | PRN
  Administered 2018-09-29: 1 via OROMUCOSAL
  Filled 2018-09-29: qty 177

## 2018-09-29 NOTE — Progress Notes (Addendum)
Progress Note  Patient Name: [name] Date of Encounter: 09/29/2018  Primary Cardiologist: Shelva Majestic, MD   Subjective   Patient looks a lot better than when I saw her yesterday.  She is no longer using oxygen.  She denies any chest discomfort or shortness of breath.  She is alert and oriented and conversant.  Her son is at the bedside and is pleased with her improvement.  Inpatient Medications    Scheduled Meds: . polyethylene glycol  17 g Oral BID  . sodium chloride flush  3 mL Intravenous Q12H   Continuous Infusions: . diltiazem (CARDIZEM) infusion 15 mg/hr (09/29/18 0507)  . famotidine (PEPCID) IV 20 mg (09/29/18 1044)  . heparin 1,200 Units/hr (09/29/18 0400)  . potassium chloride     PRN Meds: acetaminophen **OR** acetaminophen, capsaicin, levalbuterol, lip balm, metoprolol tartrate, ondansetron **OR** ondansetron (ZOFRAN) IV, simethicone, sodium chloride flush   Vital Signs    Vitals:   09/28/18 2030 09/28/18 2324 09/29/18 0343 09/29/18 0741  BP: (!) 145/66 (!) 140/54 (!) 129/53 (!) 128/44  Pulse: 83 73 83 88  Resp: 20 (!) 22 (!) 21 18  Temp: (!) 97.4 F (36.3 C) (!) 97.4 F (36.3 C) 97.7 F (36.5 C) (!) 97.5 F (36.4 C)  TempSrc: Oral Oral Oral Oral  SpO2: 99% 94% 94% 94%  Weight:   93 kg   Height:        Intake/Output Summary (Last 24 hours) at 09/29/2018 1059 Last data filed at 09/29/2018 1000 Gross per 24 hour  Intake 1515.49 ml  Output 2850 ml  Net -1334.51 ml   Last 3 Weights 09/29/2018 09/28/2018 09/27/2018  Weight (lbs) 205 lb 0.4 oz 210 lb 1.6 oz 207 lb 14.3 oz  Weight (kg) 93 kg 95.3 kg 94.3 kg      Telemetry    A. fib in the 80s- Personally Reviewed  ECG    No new tracings- Personally Reviewed  Physical Exam   GEN: No acute distress.   Neck: No JVD Cardiac: RRR, 2/6 systolic murmur at LUSB Respiratory: Clear to auscultation bilaterally. GI: Soft, NG tube in place, rectal tube MS: No edema; No deformity. Neuro:  Nonfocal   Psych: Normal affect   Labs    High Sensitivity Troponin:   Recent Labs  Lab 09/21/2018 1015 09/23/2018 1235 09/21/18 0407 09/21/18 0923 09/21/18 1107  TROPONINIHS 45* 48* 14 11 13       Chemistry Recent Labs  Lab 09/27/18 0831 09/28/18 0253 09/29/18 0238  NA 134* 130* 129*  K 4.2 3.3* 3.9  CL 100 99 94*  CO2 19* 22 24  GLUCOSE 100* 118* 109*  BUN 9 7* 7*  CREATININE 0.91 0.82 1.06*  CALCIUM 9.0 8.8* 8.6*  GFRNONAA 55* >60 46*  GFRAA >60 >60 53*  ANIONGAP 15 9 11      Hematology Recent Labs  Lab 09/27/18 0831 09/28/18 0253 09/29/18 0238  WBC 12.3* 9.6 10.0  RBC 4.43 4.08 4.21  HGB 13.2 12.1 12.7  HCT 40.9 37.5 38.5  MCV 92.3 91.9 91.4  MCH 29.8 29.7 30.2  MCHC 32.3 32.3 33.0  RDW 12.6 12.6 12.5  PLT 302 273 304    BNPNo results for input(s): BNP, PROBNP in the last 168 hours.   DDimer No results for input(s): DDIMER in the last 168 hours.   Radiology    Dg Chest 1 View  Result Date: 09/28/2018 CLINICAL DATA:  Respiratory difficulty. Cough. EXAM: CHEST  1 VIEW COMPARISON:  Chest x-rays  dated 09/23/2018 and 09/20/2018 and 09/09/2018 FINDINGS: There are persistent small bilateral pleural effusions. Heart size is within normal limits considering the AP portable technique. Pulmonary vascularity is within normal limits. Extensive aortic atherosclerosis. No discrete lung consolidation. No acute bone abnormality. IMPRESSION: Persistent small bilateral pleural effusions. Aortic atherosclerosis. No significant change. Electronically Signed   By: Lorriane Shire M.D.   On: 09/28/2018 11:04   Dg Abd 1 View  Result Date: 09/28/2018 CLINICAL DATA:  Ileus EXAM: ABDOMEN - 1 VIEW COMPARISON:  Portable exam 1430 hours compared to 09/26/2018 FINDINGS: Again identified significant dilatation of bowel loops in the mid abdomen, appears to be colon. No definite bowel wall thickening. Bones demineralized. Orthopedic hardware proximal LEFT femur. Atherosclerotic calcifications.  IMPRESSION: Persistent colonic distention question colonic ileus. Electronically Signed   By: Lavonia Dana M.D.   On: 09/28/2018 14:44    Cardiac Studies   Echocardiogram 09/18/2018: FINDINGS  Mitral Valve: There is mild mitral annular calcification present.  Aortic Valve: The aortic valve is tricuspid . Moderate thickening of the aortic valve. Severe calcifcation of the aortic valve. Aortic valve regurgitation was not visualized by color flow Doppler. There is Moderate stenosis of the aortic valve, with a  calculated valve area of 1.07 cm. Mild to moderate aortic annular calcification noted. AV Mean Grad: 22.0 mmHg. LVOT/AV VTI ratio: 0.34. AV Vmax: 298.00 cm/s.  1. The left ventricle has normal systolic function, with an ejection fraction of 55-60%. The cavity size was normal. There is moderately increased left ventricular wall thickness. Left ventricular diastolic function could not be evaluated secondary to  atrial fibrillation. 2. The right ventricle has normal systolic function. The cavity was normal. There is no increase in right ventricular wall thickness. Right ventricular systolic pressure could not be assessed. 3. The mitral valve is grossly normal. 4. The tricuspid valve is grossly normal. 5. The aortic valve was not well visualized. Moderate calcification of the aortic valve. Aortic valve regurgitation was not assessed by color flow Doppler. 6. Very poor visualization of the AV. Cannot assess number of cusps. There is thickening and calcificaiton of the AV. By doppler there is mild to moderate AS with man AVG 17mmHg and Vmax 276cm/s. 7. The aorta is normal unless otherwise noted. 8. The inferior vena cava was dilated in size with <50% respiratory variability. 9. The interatrial septum appears to be lipomatous. 10. Recommend repeat limited echo o f the AV.   Patient Profile     85 y.o. female with past medical history of hypertension, ileus, pericarditis admitted  with atrial flutter with rapid ventricular response, increased dyspnea and chest pain. Also with epigastric painand now ileus. Echocardiogram shows normal LV function and moderate aortic stenosis.  Assessment & Plan    Persistent atrial fibrillation -Continues on diltiazem 15 mg/h.  Heart rate is well controlled in the 80s.  Patient continues to be n.p.o. with now NG tube replaced.  At some point will transition to oral dosing once she is taking oral nutrition. -On IV heparin for stroke risk reduction.  Respiratory failure/PNA -Patient had respiratory distress yesterday morning but was maintaining O2 sats on 2 L of oxygen.  ABGs not too significant.  She is much improved today after nebs, briefly on BiPAP, suctioning and 2 doses of IV Lasix 40 mg with 2.2 L urine output. -Currently denies shortness of breath.  Lungs clear.  Not requiring any oxygen. -Patient has been made a DNR with continued aggressive therapy.  Hypokalemia -Potassium replaced via IV yesterday.  Potassium is better today at 3.9.  Medium also replaced.  Magnesium level 2.0 today.  Aortic stenosis -Echocardiogram on 09/18/2018 showed moderate calcification of the aortic valve, no aortic regurgitation by color flow Doppler, Moderate stenosis with a  calculated valve area of 1.07 cm and AV Mean Grad: 22.0 mmHg.  Essential hypertension -Blood pressures good today 120s, on IV Cardizem.  Ileus -Being followed by GI.  NG tube has been replaced.  Patient has rectal tube with liquid red-brown stool.      For questions or updates, please contact [place] Please consult [email or web address] for contact info under        Signed, Daune Perch, NP  09/29/2018, 10:59 AM    Personally seen and examined. Agree with above.   Much improved today.  Agree with plan as above.  Continuing with IV medications to help control her atrial fibrillation as well as IV anticoagulation since she is with chronic ileus now with NG tube.   Respiratory distress improved.  Interestingly, her ABG looked great yesterday.  We will continue to follow.  Candee Furbish, MD

## 2018-09-29 NOTE — Progress Notes (Signed)
ANTICOAGULATION CONSULT NOTE - Follow Up Consult  Pharmacy Consult for heparin Indication: atrial fibrillation  Labs: Recent Labs    09/26/18 1553  09/27/18 0831 09/28/18 0253 09/29/18 0238  HGB  --    < > 13.2 12.1 12.7  HCT  --   --  40.9 37.5 38.5  PLT  --   --  302 273 304  APTT 73*  --  64* 72*  --   HEPARINUNFRC  --   --  0.42 0.27* 0.31  CREATININE  --   --  0.91 0.82 1.06*   < > = values in this interval not displayed.    Assessment: 85yo female continues on heparin for Afib Heparin level therapeutic CBC stable   Goal of Therapy:  Heparin level 0.3-0.7 units/ml   Plan:  Continue Heparin at 1200 units / hr Follow up AM heparin level, CBC  Thank you Anette Guarneri, PharmD [phone] 09/29/2018

## 2018-09-29 NOTE — Progress Notes (Addendum)
Patient ID: [name], female   DOB: 1924-03-26, 85 y.o.   MRN: [id number]    Progress Note   Subjective  Day # 13 CC: Ileus, pneumonia with respiratory failure  Ng replaced last night, minimal output this a.m.  Rectal tube remains in place and will output this morning. Patient is in good spirits today, joking-son at bedside. She is feeling better, breathing much better and not requiring oxygen.  Uncomfortable with NG tube but tolerating.  She denies any abdominal pain currently and feels less tight than she had been.  Potassium 3.9 today, magnesium 2.0 Creatinine 1.06  KUB 09/28/2018-persistent colonic distention, question ileus CXR 8/24 - persistent small bilat effusions, no infiltrate   Objective   Vital signs in last 24 hours: Temp:  [97.4 F (36.3 C)-98.2 F (36.8 C)] 97.5 F (36.4 C) (08/25 0741) Pulse Rate:  [69-139] 88 (08/25 0741) Resp:  [13-28] 18 (08/25 0741) BP: (116-153)/(44-100) 128/44 (08/25 0741) SpO2:  [94 %-100 %] 94 % (08/25 0741) Weight:  [93 kg] 93 kg (08/25 0343) Last BM Date: 09/29/18   General: Elderly white female in NAD-son at bedside Heart:  Regular rate and rhythm; no murmurs Lungs: Respirations even and unlabored, lungs CTA bilaterally-, decreased breath sounds bilateral bases Abdomen:  Soft, minimally tender and mildly distended, not tense and no tympany. ,bowel sounds are present Extremities:  Without edema. Neurologic:  Alert and oriented,  grossly normal neurologically. Psych:  Cooperative. Normal mood and affect.  Intake/Output from previous day: 08/24 0701 - 08/25 0700 In: 1515.5 [I.V.:946.6; NG/GT:100; IV Piggyback:468.9] Out: 2200 [Urine:2200] Intake/Output this shift: Total I/O In: -  Out: 650 [Stool:650]  Lab Results: Recent Labs    09/27/18 0831 09/28/18 0253 09/29/18 0238  WBC 12.3* 9.6 10.0  HGB 13.2 12.1 12.7  HCT 40.9 37.5 38.5  PLT 302 273 304   BMET Recent Labs    09/27/18 0831 09/28/18 0253 09/29/18 0238   NA 134* 130* 129*  K 4.2 3.3* 3.9  CL 100 99 94*  CO2 19* 22 24  GLUCOSE 100* 118* 109*  BUN 9 7* 7*  CREATININE 0.91 0.82 1.06*  CALCIUM 9.0 8.8* 8.6*    Studies/Results: Dg Chest 1 View  Result Date: 09/28/2018 CLINICAL DATA:  Respiratory difficulty. Cough. EXAM: CHEST  1 VIEW COMPARISON:  Chest x-rays dated 09/23/2018 and 09/20/2018 and 09/27/2018 FINDINGS: There are persistent small bilateral pleural effusions. Heart size is within normal limits considering the AP portable technique. Pulmonary vascularity is within normal limits. Extensive aortic atherosclerosis. No discrete lung consolidation. No acute bone abnormality. IMPRESSION: Persistent small bilateral pleural effusions. Aortic atherosclerosis. No significant change. Electronically Signed   By: Lorriane Shire M.D.   On: 09/28/2018 11:04   Dg Abd 1 View  Result Date: 09/28/2018 CLINICAL DATA:  Ileus EXAM: ABDOMEN - 1 VIEW COMPARISON:  Portable exam 1430 hours compared to 09/26/2018 FINDINGS: Again identified significant dilatation of bowel loops in the mid abdomen, appears to be colon. No definite bowel wall thickening. Bones demineralized. Orthopedic hardware proximal LEFT femur. Atherosclerotic calcifications. IMPRESSION: Persistent colonic distention question colonic ileus. Electronically Signed   By: Lavonia Dana M.D.   On: 09/28/2018 14:44      Assessment / Plan:    #20 85 year old white female with acute colonic ileus in setting of acute illness with pneumonia, hypoxic respiratory failure, and atrial flutter/fib  Pneumonia has resolved, off oxygen today.  Had significant improvement  after IV Lasix x2 yesterday.  Potassium has been corrected, continue  to follow  Clinically less distended today.  Plan: Start mobilizing, up to side of bed and up to chair to help stimulate bowel Will remove rectal tube and give tap water enemas, if good results the rectal tube out Repeat abdominal films/portable today Start MiraLAX  twice daily via NG tube Leave NG tube today to LIS-okay to have sips of water and ice chips and will add Chloraseptic spray   Principal Problem:   Atrial fibrillation with RVR ([place]) Active Problems:   Moderate aortic stenosis   Ileus (HCC)   Pleural effusion   Abdominal distention   Pneumonia   Nasogastric tube present   Respiratory difficulty    LOS: 12 days   Amy Esterwood PA-C 09/29/2018, 10:36 AM      Attending Physician Note   I have taken an interval history, reviewed the chart and examined the patient. I agree with the Advanced Practitioner's note, impression and recommendations.   Lucio Edward, MD [facility]

## 2018-09-29 NOTE — Progress Notes (Addendum)
Subjective:   [name] feels "dry" and her throat hurst because of the NG tube. She cannot talk well because she does not have her teeth. She had a lot of issues with the NG tube placement yesterday. Her abdomen does not hurt until someone pushes on it. She does states that if she has to go through NG tube placement again, she does not want to do it. During her episode of [facility] yesterday she felt she was going to die. Reports it was triggered by coughing.   We discussed the plan to continue to coordinate with GI and Cardiology. She voices understanding. All questions and concerns addressed.   Objective:  Vital signs in last 24 hours: Vitals:   09/28/18 1800 09/28/18 2030 09/28/18 2324 09/29/18 0343  BP: (!) 134/97 (!) 145/66 (!) 140/54 (!) 129/53  Pulse: 89 83 73 83  Resp: (!) 24 20 (!) 22 (!) 21  Temp:  (!) 97.4 F (36.3 C) (!) 97.4 F (36.3 C) 97.7 F (36.5 C)  TempSrc:  Oral Oral Oral  SpO2: 96% 99% 94% 94%  Weight:    93 kg  Height:       Physical Exam Vitals signs and nursing note reviewed.  Constitutional:      Appearance: She is obese.  Cardiovascular:     Rate and Rhythm: Normal rate. Rhythm irregular.     Heart sounds: Murmur (systolic, 2/6) present.  Pulmonary:     Effort: Pulmonary effort is normal. No accessory muscle usage.     Breath sounds: Normal breath sounds. No wheezing.  Abdominal:     General: Bowel sounds are normal. There is distension (consistent with prior exams).     Palpations: Abdomen is soft.     Tenderness: There is generalized abdominal tenderness. There is guarding (minimal, improved from prior exams).  Neurological:     Mental Status: She is alert.    Assessment/Plan:  Principal Problem:   Atrial fibrillation with RVR (HCC) Active Problems:   Moderate aortic stenosis   Ileus (HCC)   Pleural effusion   Abdominal distention   Pneumonia   Nasogastric tube present   Respiratory difficulty  #Atrial fibrillation with atrial flutter  CHADsVAC 5-6 with HASBLED of 1. Echocardiogramon 8/14with normal LVEF, no LAE/RAE, but moderate AS.   Patient has not converted back to sinus rhythm. Due to ileus, not a candidate for cardioversion. Heart rate has remained within the 70-80s majority of time. Until tolerating PO again, will continue to IV Dilt and Metoprolol. She has not needed Metoprolol PRN in 3 days now.   - Cardiologyon board and we appreciate the recommendations - Replace K to maintain >4.0 and Mg >2.0 - Diltiazem IV   - Heparin per pharmacy - Metoprolol PRN for HR >120   #Ileus Repeat KUB on 8/15 did show a possible ileus.NG tube was placed temporarily for symptom relief on 8/16 and replaced overnight last night due to worsening abdominal pain and distention. Repeat xray demonstrated non-obstructive bowel gas patterns. CT abdomen/pelvis obtained due to decompensation clinically. No obstruction or inflammation but ileus affecting the cecum that is more than 10cm wide.   Although patient states her SOB yesterday started after coughing, her persistent severe abdominal pain that likely exacerbated with coughing may be what triggered her. Considered pain medication to avoid this happening again but do not want to constipate her. No Zofran in at least 4 days; not contributing to constipation. Replacing potassium today to get above 4.0.   - GI  on board and we appreciate their recommendations - Maintain K >4.0 - PRN zofran for nausea  - IV protonix 40 mg QD  - Rectal tube - NG tube replaced  #Acute Respiratory Failure: #Pneumonia: Chest xray (8/16) shows stable left atelectasis or pneumonia with new right basilar atelectasis or pneumonia with pleural effusion and progressive bronchitic changes. Febrile episodesand worsening respiratorydecompensationon8/16.   Weaned to room air overnight. Continue breathing treatments for continued wheezing.   - Levalbuterol PRN - Wean oxygen as tolerated.    #Hypertension Discontinued Amlodipine a few days after admission to soft BPs. She is on IV Dilt for A. Fib. No new medications at this time.    Dispo: Anticipated dischargepending medical improvement.  Dr. Jose Persia Internal Medicine PGY-1  Pager: [phone] 09/29/2018, 6:47 AM

## 2018-09-29 NOTE — Progress Notes (Signed)
Physical Therapy Treatment Patient Details Name: [name] MRN: [id number] DOB: 04-26-1924 Today's Date: 09/29/2018    History of Present Illness 85 y.o. female with past medical history of hypertension, ileus, pericarditis admitted with atrial flutter with rapid ventricular response, increased dyspnea and chest pain.  Also with epigastric pain.  Echocardiogram shows normal LV function and moderate aortic stenosis.    PT Comments    Patient received in bed, son present. Patient agrees to PT/OT session. Uncomfortable with tubes. Patient requires min +2 assist for supine><sit. Cues for safety and hand placement. Patient was able to perform sit to stand x 3 reps with mod+2 assist to stand from low bed. Performed marching in place and took 3 side steps up toward head of bed min assist.Patient will continue to benefit from skilled PT to address her weakness, decreased activity tolerance, decreased functional mobility.       Follow Up Recommendations  Home health PT;Supervision for mobility/OOB;Supervision/Assistance - 24 hour     Equipment Recommendations  None recommended by PT    Recommendations for Other Services       Precautions / Restrictions Precautions Precautions: Fall Restrictions Weight Bearing Restrictions: No    Mobility  Bed Mobility Overal bed mobility: Needs Assistance Bed Mobility: Supine to Sit;Sit to Supine     Supine to sit: Min assist;+2 for safety/equipment Sit to supine: Min assist;+2 for safety/equipment   General bed mobility comments: requires cues for hand placement  Transfers Overall transfer level: Needs assistance Equipment used: Rolling walker (2 wheeled) Transfers: Sit to/from Stand Sit to Stand: Mod assist;+2 safety/equipment            Ambulation/Gait   Gait Distance (Feet): 3 Feet Assistive device: Rolling walker (2 wheeled) Gait Pattern/deviations: Shuffle;Decreased step length - left;Decreased step length - right Gait  velocity: decreased   General Gait Details: side steps toward head of bed   Stairs             Wheelchair Mobility    Modified Rankin (Stroke Patients Only)       Balance Overall balance assessment: Needs assistance Sitting-balance support: Feet supported Sitting balance-Leahy Scale: Good     Standing balance support: Bilateral upper extremity supported;During functional activity Standing balance-Leahy Scale: Fair                              Cognition Arousal/Alertness: Awake/alert Behavior During Therapy: WFL for tasks assessed/performed Overall Cognitive Status: Within Functional Limits for tasks assessed                                        Exercises Other Exercises Other Exercises: standing marching in place x 30 sec    General Comments        Pertinent Vitals/Pain Pain Assessment: Faces Faces Pain Scale: Hurts little more Pain Location: NG tube Pain Descriptors / Indicators: Grimacing;Guarding;Discomfort Pain Intervention(s): Limited activity within patient's tolerance;Monitored during session;Repositioned    Home Living                      Prior Function            PT Goals (current goals can now be found in the care plan section) Acute Rehab PT Goals Patient Stated Goal: to return home with son, decrease pain PT Goal Formulation: With patient/family Time For Goal  Achievement: 10/09/18 Potential to Achieve Goals: Fair Progress towards PT goals: Progressing toward goals    Frequency    Min 2X/week      PT Plan Current plan remains appropriate    Co-evaluation              AM-PAC PT "6 Clicks" Mobility   Outcome Measure  Help needed turning from your back to your side while in a flat bed without using bedrails?: A Lot Help needed moving from lying on your back to sitting on the side of a flat bed without using bedrails?: A Lot Help needed moving to and from a bed to a chair (including  a wheelchair)?: A Lot Help needed standing up from a chair using your arms (e.g., wheelchair or bedside chair)?: A Lot Help needed to walk in hospital room?: A Lot Help needed climbing 3-5 steps with a railing? : Total 6 Click Score: 11    End of Session Equipment Utilized During Treatment: Gait belt Activity Tolerance: Patient tolerated treatment well;Patient limited by fatigue Patient left: in bed;with call bell/phone within reach;with family/visitor present;with nursing/sitter in room Nurse Communication: Mobility status PT Visit Diagnosis: Muscle weakness (generalized) (M62.81);Difficulty in walking, not elsewhere classified (R26.2);Other abnormalities of gait and mobility (R26.89);Pain;Unsteadiness on feet (R26.81) Pain - Right/Left: (NG tube discomfort)     Time: [id number] PT Time Calculation (min) (ACUTE ONLY): 27 min  Charges:  $Therapeutic Activity: 23-37 mins                     Laurie Lovejoy, PT, GCS 09/29/18,12:40 PM

## 2018-09-29 NOTE — Progress Notes (Addendum)
Rectal tube discontinued as ordered, Tap water enema given twice, well tolerated, small amount of yellow colored stool returned both times, patient in no distress,will continue to monitor.

## 2018-09-29 NOTE — Progress Notes (Signed)
Occupational Therapy Treatment Patient Details Name: [name] MRN: [id number] DOB: Feb 26, 1924 Today's Date: 09/29/2018    History of present illness 85 y.o. female with past medical history of hypertension, ileus, pericarditis admitted with atrial flutter with rapid ventricular response, increased dyspnea and chest pain.  Also with epigastric pain.  Echocardiogram shows normal LV function and moderate aortic stenosis.   OT comments  Pt progressing toward established goals. Pt currently requires totalA for LB dressing, pt able to tolerate sitting EOB ~76min in preparation for ADL. Pt required minA+2 for bed mobility. Pt performed sit<>stand from EOB x3 with modA+2 and tooke 3 steps toward head of the bed with minA and use of RW. HR briefly 151bpm with marching in place during 3rd standing trial. Resting HR 110s. SpO2 >93% RA throughout session. Pt will continue to benefit from skilled OT services to maximize safety and independence with ADL/IADL and functional mobility. Will continue to follow acutely and progress as tolerated.      Follow Up Recommendations  SNF;Home health OT;Supervision/Assistance - 24 hour    Equipment Recommendations  3 in 1 bedside commode    Recommendations for Other Services      Precautions / Restrictions Precautions Precautions: Fall Restrictions Weight Bearing Restrictions: No       Mobility Bed Mobility Overal bed mobility: Needs Assistance Bed Mobility: Supine to Sit;Sit to Supine     Supine to sit: Min assist;+2 for safety/equipment Sit to supine: Min assist;+2 for safety/equipment   General bed mobility comments: requires cues for hand placement  Transfers Overall transfer level: Needs assistance Equipment used: Rolling walker (2 wheeled) Transfers: Sit to/from Stand Sit to Stand: Mod assist;+2 safety/equipment         General transfer comment: sit<>stand x3 from EOB with modA+2 for powerup    Balance Overall balance assessment:  Needs assistance Sitting-balance support: Feet supported Sitting balance-Leahy Scale: Good     Standing balance support: Bilateral upper extremity supported;During functional activity Standing balance-Leahy Scale: Fair                             ADL either performed or assessed with clinical judgement   ADL Overall ADL's : Needs assistance/impaired Eating/Feeding: NPO Eating/Feeding Details (indicate cue type and reason): NG tube Grooming: Wash/dry face;Set up;Sitting;Bed level               Lower Body Dressing: Total assistance;+2 for physical assistance;Sit to/from stand;Moderate assistance       Toileting- Clothing Manipulation and Hygiene: Total assistance;+2 for physical assistance;Sit to/from stand;Moderate assistance       Functional mobility during ADLs: +2 for physical assistance;Rolling walker;Moderate assistance General ADL Comments: sit<>stand from EOB x3 with modA+2 for powerup;pt able to tolerate sitting EOB about 83min;HR up to 151bpm with walking in place during 3rd standing trial     Vision       Perception     Praxis      Cognition Arousal/Alertness: Awake/alert Behavior During Therapy: WFL for tasks assessed/performed Overall Cognitive Status: Within Functional Limits for tasks assessed                                          Exercises Other Exercises Other Exercises: standing marching in place x 30 sec   Shoulder Instructions       General Comments pt's son present  during session;HR elevated to 151bpm briefly with marching in place on 3rd standing trial;    Pertinent Vitals/ Pain       Pain Assessment: Faces Faces Pain Scale: Hurts little more Pain Location: NG tube Pain Descriptors / Indicators: Grimacing;Guarding;Discomfort Pain Intervention(s): Limited activity within patient's tolerance;Monitored during session;Repositioned  Home Living                                           Prior Functioning/Environment              Frequency  Min 2X/week        Progress Toward Goals  OT Goals(current goals can now be found in the care plan section)  Progress towards OT goals: Progressing toward goals  Acute Rehab OT Goals Patient Stated Goal: to return home with son, decrease pain OT Goal Formulation: With patient Time For Goal Achievement: 10/08/18 Potential to Achieve Goals: Good ADL Goals Pt Will Perform Grooming: with set-up;sitting Pt Will Perform Upper Body Dressing: with min guard assist;sitting Pt Will Perform Lower Body Dressing: with min guard assist;sit to/from stand Pt Will Transfer to Toilet: ambulating;with min assist Pt Will Perform Toileting - Clothing Manipulation and hygiene: with min assist;sit to/from stand Additional ADL Goal #1: Pt will progress to EOB with supervision in preparation for ADL.  Plan Discharge plan remains appropriate    Co-evaluation    PT/OT/SLP Co-Evaluation/Treatment: Yes Reason for Co-Treatment: For patient/therapist safety;To address functional/ADL transfers;Complexity of the patient's impairments (multi-system involvement) PT goals addressed during session: Mobility/safety with mobility;Strengthening/ROM;Proper use of DME;Balance OT goals addressed during session: ADL's and self-care      AM-PAC OT "6 Clicks" Daily Activity     Outcome Measure   Help from another person eating meals?: Total Help from another person taking care of personal grooming?: A Lot Help from another person toileting, which includes using toliet, bedpan, or urinal?: A Lot Help from another person bathing (including washing, rinsing, drying)?: A Lot Help from another person to put on and taking off regular upper body clothing?: A Lot Help from another person to put on and taking off regular lower body clothing?: A Lot 6 Click Score: 11    End of Session Equipment Utilized During Treatment: Gait belt;Rolling walker  OT Visit  Diagnosis: Other abnormalities of gait and mobility (R26.89);History of falling (Z91.81);Pain;Feeding difficulties (R63.3) Pain - part of body: (ng tube)   Activity Tolerance Patient tolerated treatment well;Patient limited by fatigue   Patient Left in bed;with call bell/phone within reach;with bed alarm set;with family/visitor present   Nurse Communication Mobility status        Time: [id number] OT Time Calculation (min): 27 min  Charges: OT General Charges $OT Visit: 1 Visit OT Treatments $Self Care/Home Management : 8-22 mins  Dorinda Hill OTR/L [place] Office: [phone]    Wyn Forster 09/29/2018, 1:24 PM

## 2018-09-30 ENCOUNTER — Inpatient Hospital Stay (HOSPITAL_COMMUNITY): Payer: Medicare Other

## 2018-09-30 LAB — BASIC METABOLIC PANEL
Anion gap: 14 (ref 5–15)
BUN: 9 mg/dL (ref 8–23)
CO2: 22 mmol/L (ref 22–32)
Calcium: 8.8 mg/dL — ABNORMAL LOW (ref 8.9–10.3)
Chloride: 93 mmol/L — ABNORMAL LOW (ref 98–111)
Creatinine, Ser: 0.92 mg/dL (ref 0.44–1.00)
GFR calc Af Amer: >60 mL/min (ref >60)
GFR calc non Af Amer: 54 mL/min — ABNORMAL LOW (ref >60)
Glucose, Bld: 109 mg/dL — ABNORMAL HIGH (ref 70–99)
Potassium: 3.5 mmol/L (ref 3.5–5.1)
Sodium: 129 mmol/L — ABNORMAL LOW (ref 135–145)

## 2018-09-30 LAB — CBC
HCT: 37.6 % (ref 36.0–46.0)
Hemoglobin: 12.4 g/dL (ref 12.0–15.0)
MCH: 30 pg (ref 26.0–34.0)
MCHC: 33 g/dL (ref 30.0–36.0)
MCV: 91 fL (ref 80.0–100.0)
Platelets: 323 10*3/uL (ref 150–400)
RBC: 4.13 MIL/uL (ref 3.87–5.11)
RDW: 12.4 % (ref 11.5–15.5)
WBC: 8.9 10*3/uL (ref 4.0–10.5)
nRBC: 0 % (ref 0.0–0.2)

## 2018-09-30 LAB — HEPARIN LEVEL (UNFRACTIONATED): Heparin Unfractionated: 0.63 IU/mL (ref 0.30–0.70)

## 2018-09-30 LAB — MAGNESIUM: Magnesium: 1.8 mg/dL (ref 1.7–2.4)

## 2018-09-30 MED ORDER — FUROSEMIDE 10 MG/ML IJ SOLN
20.0000 mg | Freq: Once | INTRAMUSCULAR | Status: AC
  Administered 2018-09-30: 14:00:00 20 mg via INTRAVENOUS

## 2018-09-30 MED ORDER — FUROSEMIDE 10 MG/ML IJ SOLN
INTRAMUSCULAR | Status: AC
  Filled 2018-09-30: qty 2

## 2018-09-30 MED ORDER — FUROSEMIDE 10 MG/ML IJ SOLN
20.0000 mg | Freq: Once | INTRAMUSCULAR | Status: AC
  Administered 2018-09-30: 20 mg via INTRAVENOUS
  Filled 2018-09-30: qty 2

## 2018-09-30 MED ORDER — MAGIC MOUTHWASH W/LIDOCAINE
2.0000 mL | ORAL | Status: AC | PRN
  Administered 2018-09-30: 2 mL via ORAL
  Filled 2018-09-30: qty 5

## 2018-09-30 MED ORDER — BOOST / RESOURCE BREEZE PO LIQD CUSTOM
1.0000 | Freq: Three times a day (TID) | ORAL | Status: DC
  Administered 2018-09-30 – 2018-10-06 (×14): 1 via ORAL

## 2018-09-30 MED ORDER — MAGNESIUM SULFATE 2 GM/50ML IV SOLN
2.0000 g | Freq: Once | INTRAVENOUS | Status: AC
  Administered 2018-09-30: 2 g via INTRAVENOUS
  Filled 2018-09-30: qty 50

## 2018-09-30 MED ORDER — POTASSIUM CHLORIDE 10 MEQ/100ML IV SOLN
10.0000 meq | INTRAVENOUS | Status: AC
  Administered 2018-09-30 (×2): 10 meq via INTRAVENOUS
  Filled 2018-09-30 (×2): qty 100

## 2018-09-30 MED ORDER — POTASSIUM CHLORIDE 20 MEQ PO PACK
40.0000 meq | PACK | Freq: Once | ORAL | Status: AC
  Administered 2018-09-30: 13:00:00 40 meq via NASOGASTRIC
  Filled 2018-09-30: qty 2

## 2018-09-30 MED ORDER — ADULT MULTIVITAMIN W/MINERALS CH
1.0000 | ORAL_TABLET | Freq: Every day | ORAL | Status: DC
  Administered 2018-10-02 – 2018-10-06 (×5): 1 via ORAL
  Filled 2018-09-30 (×7): qty 1

## 2018-09-30 MED ORDER — FAMOTIDINE IN NACL 20-0.9 MG/50ML-% IV SOLN
20.0000 mg | INTRAVENOUS | Status: DC
  Administered 2018-09-30 – 2018-10-09 (×10): 20 mg via INTRAVENOUS
  Filled 2018-09-30 (×11): qty 50

## 2018-09-30 NOTE — Progress Notes (Addendum)
ANTICOAGULATION CONSULT NOTE  Pharmacy Consult:  Heparin Indication: atrial fibrillation  Allergies  Allergen Reactions  . Diclofenac Sodium Anaphylaxis  . Ibuprofen Anaphylaxis  . Lisinopril     Fall hazard, dizzy  . Adhesive [Tape]   . Codeine Nausea Only  . Tizanidine     "knocked out cold"    Patient Measurements: Height: 5\' 6"  (167.6 cm) Weight: 201 lb 8 oz (91.4 kg) IBW/kg (Calculated) : 59.3 Heparin Dosing Weight: 80 kg  Vital Signs: Temp: 97.4 F (36.3 C) (08/26 0429) Temp Source: Oral (08/26 0429) BP: 147/58 (08/26 0429) Pulse Rate: 79 (08/26 0429)  Labs: Recent Labs    09/27/18 0831 09/28/18 0253 09/29/18 0238  HGB 13.2 12.1 12.7  HCT 40.9 37.5 38.5  PLT 302 273 304  APTT 64* 72*  --   HEPARINUNFRC 0.42 0.27* 0.31  CREATININE 0.91 0.82 1.06*    Estimated Creatinine Clearance: 39.3 mL/min (A) (by C-G formula based on SCr of 1.06 mg/dL (H)).  Assessment: 77 YOF with new atrial fibrillation this admit. Received IV heparin 8/14>>8/18, when changed to Eliquis 5 mg PO BID.  Meds back to IV heparin on 09/24/18 due to ileus.    Heparin level is therapeutic and toward the low end of normal.  No bleeding reported.  Goal of Therapy:  Heparin level 0.3-0.7 units/mL Monitor platelets by anticoagulation protocol: Yes   Plan:  Increase heparin gtt slightly to 1250 units/hr Daily heparin level and CBC Reduce Pepcid to daily d/t low renal clearance  Dhiren Azimi D. Mina Marble, PharmD, BCPS, [place] 09/30/2018, 8:11 AM

## 2018-09-30 NOTE — Progress Notes (Signed)
Re-evaluated patient at bedside after receiving message from GI PA regarding respiratory distress with concern for volume overload.   On evaluation, no JVD present bilaterally and only trace pitting edema bilaterally. No crackles on exam but audible wheezes that are slightly worse than this morning. Her son Saralyn Pilar at bedside notes that respiratory distress on 08/24 started similarly to current presentation and was alleviated with IV Lasix. At this time, ordered Lasix 20mg  IV.   If no improvement in 1-2 hours will consider a second dose of Lasix 20mg  IV.

## 2018-09-30 NOTE — Progress Notes (Addendum)
Patient ID: [name], female   DOB: January 18, 1925, 85 y.o.   MRN: [id number]    Progress Note   Subjective  Day # 13 CC: colonic ileus  Good results from enemas yesterday, rectal tube left out. Per nursing notes patient was confused and anxious during the night-resting quietly now.  Awakens easily, knows my name and is appropriate.  No complaints of abdominal discomfort or nausea.  K 3.5 KUB - similar - dilated loops of large bowel     Objective   Vital signs in last 24 hours: Temp:  [97.4 F (36.3 C)-97.6 F (36.4 C)] 97.4 F (36.3 C) (08/26 0429) Pulse Rate:  [68-97] 79 (08/26 0429) Resp:  [15-16] 15 (08/26 0429) BP: (122-148)/(58-103) 147/58 (08/26 0429) SpO2:  [95 %-96 %] 95 % (08/26 0429) Weight:  [91.4 kg] 91.4 kg (08/26 0429) Last BM Date: 09/29/18 General: Elderly white female in NAD Heart:  irRegular rate and rhythm; no murmurs Lungs: Respirations even and unlabored, lungs, few crackles bilateral bases Abdomen:  Soft, nontender , mildly distended, bowel sounds are present Extremities:  Without edema. Neurologic:  Alert and oriented,  grossly normal neurologically. Psych:  Cooperative. Normal mood and affect.  Intake/Output from previous day: 08/25 0701 - 08/26 0700 In: 1311.1 [I.V.:641.1; NG/GT:370; IV Piggyback:300] Out: 1210 [Urine:260; Emesis/NG output:200; Stool:750] Intake/Output this shift: No intake/output data recorded.  Lab Results: Recent Labs    09/28/18 0253 09/29/18 0238 09/30/18 0830  WBC 9.6 10.0 8.9  HGB 12.1 12.7 12.4  HCT 37.5 38.5 37.6  PLT 273 304 323   BMET Recent Labs    09/28/18 0253 09/29/18 0238 09/30/18 0830  NA 130* 129* 129*  K 3.3* 3.9 3.5  CL 99 94* 93*  CO2 22 24 22   GLUCOSE 118* 109* 109*  BUN 7* 7* 9  CREATININE 0.82 1.06* 0.92  CALCIUM 8.8* 8.6* 8.8*    Studies/Results: Dg Chest 1 View  Result Date: 09/28/2018 CLINICAL DATA:  Respiratory difficulty. Cough. EXAM: CHEST  1 VIEW COMPARISON:  Chest x-rays  dated 09/23/2018 and 09/20/2018 and 09/29/2018 FINDINGS: There are persistent small bilateral pleural effusions. Heart size is within normal limits considering the AP portable technique. Pulmonary vascularity is within normal limits. Extensive aortic atherosclerosis. No discrete lung consolidation. No acute bone abnormality. IMPRESSION: Persistent small bilateral pleural effusions. Aortic atherosclerosis. No significant change. Electronically Signed   By: Lorriane Shire M.D.   On: 09/28/2018 11:04   Dg Abd 1 View  Result Date: 09/29/2018 CLINICAL DATA:  Ileus. EXAM: ABDOMEN - 1 VIEW COMPARISON:  09/28/2018.  CT 09/24/2018. FINDINGS: NG tube noted in the distal stomach/proximal duodenum persistent colonic distention. Colonic ileus or obstruction could present this fashion. No interim change noted from prior exam. No free air. Postsurgical changes left hip. Degenerative changes lumbar spine and both hips. IMPRESSION: 1. NG tube noted with its tip in the distal stomach/proximal duodenum. 2. Persistent colonic distention consistent with colonic ileus or obstruction. No interim change from prior exam. Electronically Signed   By: Marcello Moores  Register   On: 09/29/2018 14:48   Dg Abd 1 View  Result Date: 09/28/2018 CLINICAL DATA:  Ileus EXAM: ABDOMEN - 1 VIEW COMPARISON:  Portable exam 1430 hours compared to 09/26/2018 FINDINGS: Again identified significant dilatation of bowel loops in the mid abdomen, appears to be colon. No definite bowel wall thickening. Bones demineralized. Orthopedic hardware proximal LEFT femur. Atherosclerotic calcifications. IMPRESSION: Persistent colonic distention question colonic ileus. Electronically Signed   By: Crist Infante.D.  On: 09/28/2018 14:44       Assessment / Plan:    #14 85 year old female with history of chronic constipation and prior colonic ileus with persistent moderate colonic ileus in setting of acute illness with pneumonia, hypoxic respiratory failure and atrial fib  flutter.  Rectal tube discontinued yesterday, she did have fairly good results with enemas, abdomen clinically about the same today, and patient has no complaints of abdominal pain.  KUB showing persistently dilated colonic loops-suspect she may have some chronic component to bowel dilation  #2 bilateral pleural effusion  #3 hypokalemia-resolved, potassium is drifted again we will give 2 rounds of 10 mEq today  #4 transient nighttime confusion  Plan:   Will give to tap water enemas again today Continue MiraLAX via NG tube twice daily Clamp NG tube today and start clear liquids as she needs nutrition.  If she tolerates liquids well today will DC NG tube tomorrow To need to replace potassium as needed to keep > 4 and magnesium > 2  Consider trial of erythromycin suspension, have some benefit.  Will discuss   Principal Problem:   Atrial fibrillation with RVR (HCC) Active Problems:   Moderate aortic stenosis   Ileus (HCC)   Pleural effusion   Abdominal distention   Pneumonia   Nasogastric tube present   Respiratory difficulty    LOS: 13 days   Amy Esterwood PA-C 09/30/2018, 9:34 AM      Attending Physician Note   I have taken an interval history, reviewed the chart and examined the patient. I agree with the Advanced Practitioner's note, impression and recommendations.   Lucio Edward, MD [facility]

## 2018-09-30 NOTE — Progress Notes (Signed)
Initial Nutrition Assessment  DOCUMENTATION CODES:   Not applicable  INTERVENTION:    Boost Breeze po TID, each supplement provides 250 kcal and 9 grams of protein  MVI daily  NUTRITION DIAGNOSIS:   Increased nutrient needs related to acute illness as evidenced by estimated needs.  GOAL:   Patient will meet greater than or equal to 90% of their needs  MONITOR:   PO intake, Supplement acceptance, Diet advancement, Skin, Weight trends, Labs, I & O's  REASON FOR ASSESSMENT:   NPO/Clear Liquid Diet    ASSESSMENT:   Patient with PMH significant for HTN, HLD, and previous SBO. Presents this admission with colonic ileus.   8/24- NGT placed   RD working remotely.  Unable to reach pt by phone. Pt had good results from enemas yesterday. Rectal tube out. NGT clamped. Diet advanced to clear liquids. RD to provide supplementation to maximize kcal and protein.If pt unable to tolerate clear, recommend initiation of nutrition support as pt has been without adequate nutrition for prolonged period.   Admission weight: 95.7 kg Current weight: 91.4 kg   Per chart history, weight recordings are limited over the last year.   I/O: -2,447 ml since admit UOP: 260 ml x 24 hrs NGT: 200 ml x 24 hrs  Rectal tube: 750 ml x 24 hrs   Drips: Mg sulfate, 10 mEq KCl Medications: miralax Labs: Na 129 (L)    Diet Order:   Diet Order            Diet clear liquid Room service appropriate? Yes; Fluid consistency: Thin  Diet effective now              EDUCATION NEEDS:   Not appropriate for education at this time  Skin:  Skin Assessment: Skin Integrity Issues: Skin Integrity Issues:: Other (Comment) Other: MASD- buttocks, nose  Last BM:  8/25  Height:   Ht Readings from Last 1 Encounters:  09/08/2018 5\' 6"  (1.676 m)    Weight:   Wt Readings from Last 1 Encounters:  09/30/18 91.4 kg    Ideal Body Weight:  59.1 kg  BMI:  Body mass index is 32.52 kg/m.  Estimated  Nutritional Needs:   Kcal:  1700-1900 kcal  Protein:  85-100 grams  Fluid:  >/= 1.7 L/day   Mariana Single RD, LDN Clinical Nutrition Pager # - [phone]

## 2018-09-30 NOTE — Progress Notes (Signed)
Subjective:   [name] reports she is doing better today. She feels her stomach is less distended and painful today after having the enemas yesterday. She notes some continued wheezing but is no longer feeling short of breathe.   Per nursing, patient did have an episode of delirium last night but was easily calmed and re-oriented. This morning, she is feeling like herself. Notes she was able to get some good sleep.   Objective:  Vital signs in last 24 hours: Vitals:   09/29/18 1626 09/29/18 2046 09/30/18 0014 09/30/18 0429  BP: (!) 144/103 (!) 141/61 (!) 148/68 (!) 147/58  Pulse: 86 97 68 79  Resp: 16 15 16 15   Temp: 97.6 F (36.4 C) (!) 97.5 F (36.4 C) (!) 97.5 F (36.4 C) (!) 97.4 F (36.3 C)  TempSrc: Oral Oral Oral Oral  SpO2: 96% 96% 96% 95%  Weight:    91.4 kg  Height:       Physical Exam Vitals signs and nursing note reviewed.  Constitutional:      Appearance: She is obese.  Cardiovascular:     Rate and Rhythm: Normal rate. Rhythm irregular.     Heart sounds: Murmur (systolic, 2/6) present.  Pulmonary:     Effort: Pulmonary effort is normal. No accessory muscle usage or respiratory distress.     Breath sounds: Wheezing (Minimal, diffuse) present. No rales.  Abdominal:     General: There is distension (improving).     Palpations: Abdomen is soft.     Comments: Decreased bowel sounds continue.  Neurological:     Mental Status: She is alert.    Assessment/Plan:  Principal Problem:   Atrial fibrillation with RVR (HCC) Active Problems:   Moderate aortic stenosis   Ileus (HCC)   Pleural effusion   Abdominal distention   Pneumonia   Nasogastric tube present   Respiratory difficulty  [name] is a 85 y/o female with a PMHx of HTN and ileus who was admitted for new onset A. Fib with RVR. She developed some emesis initially on admission with possible aspiration, and was treated for aspiration pneumonia. Hospital stay has also been complicated by ileus  development.  #Atrial fibrillation with atrial flutter CHADsVAC 5-6 with HASBLED of 1. Echocardiogramon 8/14with normal LVEF, no LAE/RAE, but moderate AS.   Continue with Diltiazem drip and Heparin until patient can tolerate PO again.   - Cardiologyon board and we appreciate the recommendations - Replace K to maintain >4.0 and Mg >2.0 - Diltiazem IV   - Heparin per pharmacy - Metoprolol PRN for HR >120   #Ileus Improved today clinically since having two enemas yesterday. Potassium on the lower end of normal today, so replaced.   - GI on board and we appreciate their recommendations - Maintain K >4.0 - PRN zofran for nausea  - IV protonix 40 mg QD  - NG tube replaced - Miralax BID through NG  #Acute Respiratory Failure: #Pneumonia:Treated Continues to tolerate room air with saturation in the low 90s overnight. No additional bouts of respiratory distress since 08/24. Continued wheezing on exam that has been present since admission. Wondering if there is a component of chronic lung disease, undiagnosed.   - Levalbuterol PRN for wheezing.   #Hypertension Discontinued Amlodipine a few days after admission to soft BPs. She is on IV Dilt for A. Fib. No new medications at this time.    Dispo: Anticipated dischargepending medical improvement.  Dr. Jose Persia Internal Medicine PGY-1  Pager: [phone] 09/30/2018, 7:03  AM

## 2018-09-30 NOTE — Progress Notes (Signed)
NG tube has been placed back on low-intermittent suction. Pt tolerating well.  Will continue to monitor.  Lupita Dawn, RN

## 2018-09-30 NOTE — Progress Notes (Signed)
Progress Note  Patient Name: [name] Date of Encounter: 09/30/2018  Primary Cardiologist: Shelva Majestic, MD   Subjective   Confused last night, did not know where she was.  Sundowning.  Anxious.  She eventually calmed down.  Inpatient Medications    Scheduled Meds:  polyethylene glycol  17 g Oral BID   sodium chloride flush  3 mL Intravenous Q12H   Continuous Infusions:  diltiazem (CARDIZEM) infusion 15 mg/hr (09/30/18 0738)   famotidine (PEPCID) IV     heparin 1,200 Units/hr (09/30/18 0400)   PRN Meds: acetaminophen **OR** acetaminophen, capsaicin, levalbuterol, lip balm, metoprolol tartrate, metroNIDAZOLE, ondansetron **OR** ondansetron (ZOFRAN) IV, phenol, simethicone, sodium chloride flush   Vital Signs    Vitals:   09/29/18 1626 09/29/18 2046 09/30/18 0014 09/30/18 0429  BP: (!) 144/103 (!) 141/61 (!) 148/68 (!) 147/58  Pulse: 86 97 68 79  Resp: 16 15 16 15   Temp: 97.6 F (36.4 C) (!) 97.5 F (36.4 C) (!) 97.5 F (36.4 C) (!) 97.4 F (36.3 C)  TempSrc: Oral Oral Oral Oral  SpO2: 96% 96% 96% 95%  Weight:    91.4 kg  Height:        Intake/Output Summary (Last 24 hours) at 09/30/2018 0838 Last data filed at 09/30/2018 [id number] Gross per 24 hour  Intake 1311.1 ml  Output 1210 ml  Net 101.1 ml   Last 3 Weights 09/30/2018 09/29/2018 09/28/2018  Weight (lbs) 201 lb 8 oz 205 lb 0.4 oz 210 lb 1.6 oz  Weight (kg) 91.4 kg 93 kg 95.3 kg      Telemetry    Atrial fibrillation heart rates in the 90s- Personally Reviewed  ECG    No new- Personally Reviewed  Physical Exam   GEN: No acute distress.  Elderly Neck: No JVD Cardiac:  Irregularly irregular, no murmurs, rubs, or gallops.  Respiratory: Clear to auscultation bilaterally. GI: Soft, nontender, non-distended  MS: No edema; No deformity. Neuro:  Nonfocal  Psych: Normal affect   Labs    High Sensitivity Troponin:   Recent Labs  Lab 09/15/2018 1015 09/05/2018 1235 09/21/18 0407 09/21/18 0923  09/21/18 1107  TROPONINIHS 45* 48* 14 11 13       Chemistry Recent Labs  Lab 09/27/18 0831 09/28/18 0253 09/29/18 0238  NA 134* 130* 129*  K 4.2 3.3* 3.9  CL 100 99 94*  CO2 19* 22 24  GLUCOSE 100* 118* 109*  BUN 9 7* 7*  CREATININE 0.91 0.82 1.06*  CALCIUM 9.0 8.8* 8.6*  GFRNONAA 55* >60 46*  GFRAA >60 >60 53*  ANIONGAP 15 9 11      Hematology Recent Labs  Lab 09/27/18 0831 09/28/18 0253 09/29/18 0238  WBC 12.3* 9.6 10.0  RBC 4.43 4.08 4.21  HGB 13.2 12.1 12.7  HCT 40.9 37.5 38.5  MCV 92.3 91.9 91.4  MCH 29.8 29.7 30.2  MCHC 32.3 32.3 33.0  RDW 12.6 12.6 12.5  PLT 302 273 304    BNPNo results for input(s): BNP, PROBNP in the last 168 hours.   DDimer No results for input(s): DDIMER in the last 168 hours.   Radiology    Dg Chest 1 View  Result Date: 09/28/2018 CLINICAL DATA:  Respiratory difficulty. Cough. EXAM: CHEST  1 VIEW COMPARISON:  Chest x-rays dated 09/23/2018 and 09/20/2018 and 09/06/2018 FINDINGS: There are persistent small bilateral pleural effusions. Heart size is within normal limits considering the AP portable technique. Pulmonary vascularity is within normal limits. Extensive aortic atherosclerosis. No discrete lung consolidation.  No acute bone abnormality. IMPRESSION: Persistent small bilateral pleural effusions. Aortic atherosclerosis. No significant change. Electronically Signed   By: Lorriane Shire M.D.   On: 09/28/2018 11:04   Dg Abd 1 View  Result Date: 09/29/2018 CLINICAL DATA:  Ileus. EXAM: ABDOMEN - 1 VIEW COMPARISON:  09/28/2018.  CT 09/24/2018. FINDINGS: NG tube noted in the distal stomach/proximal duodenum persistent colonic distention. Colonic ileus or obstruction could present this fashion. No interim change noted from prior exam. No free air. Postsurgical changes left hip. Degenerative changes lumbar spine and both hips. IMPRESSION: 1. NG tube noted with its tip in the distal stomach/proximal duodenum. 2. Persistent colonic distention  consistent with colonic ileus or obstruction. No interim change from prior exam. Electronically Signed   By: Marcello Moores  Register   On: 09/29/2018 14:48   Dg Abd 1 View  Result Date: 09/28/2018 CLINICAL DATA:  Ileus EXAM: ABDOMEN - 1 VIEW COMPARISON:  Portable exam 1430 hours compared to 09/26/2018 FINDINGS: Again identified significant dilatation of bowel loops in the mid abdomen, appears to be colon. No definite bowel wall thickening. Bones demineralized. Orthopedic hardware proximal LEFT femur. Atherosclerotic calcifications. IMPRESSION: Persistent colonic distention question colonic ileus. Electronically Signed   By: Lavonia Dana M.D.   On: 09/28/2018 14:44    Cardiac Studies   EF 60%.  Moderate aortic stenosis.  Patient Profile     85 y.o. female with ileus, atrial fibrillation, normal EF, moderate aortic stenosis, transient delirium  Assessment & Plan    Persistent atrial fibrillation -Continue with rate control with IV diltiazem.  Unable to take p.o. at this time.  Heart rate well controlled.  Chronic anticoagulation -Currently on IV heparin because she is unable to take p.o.  When able, transition to [place]  Aortic stenosis -Moderate.  At this time should be of no clinical significance.  Normal EF.  Delirium -Sundowning noted last night.  Anxiety.  On 09/28/2018 when she had her episode of acute respiratory distress requiring BiPAP, I wonder if this was anxiety related/delirium.  Her blood gas during this episode was essentially unremarkable.  Ileus - Has been a chronic issue off and on.  NG tube.  No new changes.     For questions or updates, please contact [place] Please consult [email or web address] for contact info under        Signed, Candee Furbish, MD  09/30/2018, 8:38 AM

## 2018-09-30 NOTE — Progress Notes (Signed)
Miralax has been given through the NG tube.  Suction has been turned off and NG tube clamped.  Will continue to monitor.  Lupita Dawn, RN

## 2018-09-30 NOTE — Progress Notes (Signed)
Pt was found awake at 0230 in a state of confusion.  Pt knew her name, birthday, what year it was but could not remember where she was or why she was here.  Pt also couldn't remember the nurse or tech's names.  Repeatedly said, "I'm lost.  I need to go."  Appeared anxious.  Nurse spent time calming the pt and explaining what was going on.  The more time went on, pt appeared calmer and able to remember more. Pt currently resting in bed.  Will continue to monitor.  Lupita Dawn, RN

## 2018-10-01 ENCOUNTER — Inpatient Hospital Stay (HOSPITAL_COMMUNITY): Payer: Medicare Other

## 2018-10-01 DIAGNOSIS — J9600 Acute respiratory failure, unspecified whether with hypoxia or hypercapnia: Secondary | ICD-10-CM

## 2018-10-01 LAB — CBC
HCT: 39.2 % (ref 36.0–46.0)
Hemoglobin: 12.9 g/dL (ref 12.0–15.0)
MCH: 29.9 pg (ref 26.0–34.0)
MCHC: 32.9 g/dL (ref 30.0–36.0)
MCV: 90.7 fL (ref 80.0–100.0)
Platelets: 329 10*3/uL (ref 150–400)
RBC: 4.32 MIL/uL (ref 3.87–5.11)
RDW: 12.5 % (ref 11.5–15.5)
WBC: 9.7 10*3/uL (ref 4.0–10.5)
nRBC: 0 % (ref 0.0–0.2)

## 2018-10-01 LAB — BASIC METABOLIC PANEL
Anion gap: 14 (ref 5–15)
BUN: 10 mg/dL (ref 8–23)
CO2: 22 mmol/L (ref 22–32)
Calcium: 8.8 mg/dL — ABNORMAL LOW (ref 8.9–10.3)
Chloride: 93 mmol/L — ABNORMAL LOW (ref 98–111)
Creatinine, Ser: 0.92 mg/dL (ref 0.44–1.00)
GFR calc Af Amer: >60 mL/min (ref >60)
GFR calc non Af Amer: 54 mL/min — ABNORMAL LOW (ref >60)
Glucose, Bld: 95 mg/dL (ref 70–99)
Potassium: 3.7 mmol/L (ref 3.5–5.1)
Sodium: 129 mmol/L — ABNORMAL LOW (ref 135–145)

## 2018-10-01 LAB — HEPARIN LEVEL (UNFRACTIONATED)
Heparin Unfractionated: 0.79 IU/mL — ABNORMAL HIGH (ref 0.30–0.70)
Heparin Unfractionated: 0.72 IU/mL — ABNORMAL HIGH (ref 0.30–0.70)

## 2018-10-01 LAB — MAGNESIUM: Magnesium: 1.9 mg/dL (ref 1.7–2.4)

## 2018-10-01 MED ORDER — MAGIC MOUTHWASH
2.0000 mL | Freq: Three times a day (TID) | ORAL | Status: DC | PRN
  Administered 2018-10-01 – 2018-10-11 (×4): 2 mL via ORAL
  Filled 2018-10-01 (×6): qty 5

## 2018-10-01 MED ORDER — POTASSIUM CHLORIDE 10 MEQ/100ML IV SOLN
10.0000 meq | INTRAVENOUS | Status: AC
  Administered 2018-10-01 (×4): 10 meq via INTRAVENOUS
  Filled 2018-10-01 (×4): qty 100

## 2018-10-01 MED ORDER — MAGNESIUM SULFATE 2 GM/50ML IV SOLN
2.0000 g | Freq: Once | INTRAVENOUS | Status: AC
  Administered 2018-10-01: 2 g via INTRAVENOUS
  Filled 2018-10-01: qty 50

## 2018-10-01 MED ORDER — ALBUTEROL SULFATE (2.5 MG/3ML) 0.083% IN NEBU
2.5000 mg | INHALATION_SOLUTION | Freq: Four times a day (QID) | RESPIRATORY_TRACT | Status: DC | PRN
  Administered 2018-10-01 – 2018-10-02 (×2): 2.5 mg via RESPIRATORY_TRACT
  Filled 2018-10-01 (×2): qty 3

## 2018-10-01 MED ORDER — ENSURE SURGERY PO LIQD
237.0000 mL | Freq: Two times a day (BID) | ORAL | Status: DC
  Filled 2018-10-01 (×3): qty 237

## 2018-10-01 NOTE — Progress Notes (Addendum)
Patient ID: [name], female   DOB: 1924-03-29, 85 y.o.   MRN: [id number]    Progress Note   Subjective  Day # 14  CC: persistent colonic ileus  Sitting up in chair, says she is feeling better, denies any shortness of breath today. Patient son is convinced she does better when she stays on a small dose of Lasix Had fair results with enemas yesterday, mostly liquid stool return. NG tube has been clamped and tolerating. Had a liquid bowel movement on her own this morning  K= 3.7  CBC -wnl   Objective   Vital signs in last 24 hours: Temp:  [97.4 F (36.3 C)-97.5 F (36.4 C)] 97.5 F (36.4 C) (08/27 0742) Pulse Rate:  [52-86] 64 (08/27 0742) Resp:  [16-24] 18 (08/27 0742) BP: (139-153)/(57-81) 153/74 (08/27 0742) SpO2:  [95 %-97 %] 97 % (08/27 0742) Weight:  [91.9 kg] 91.9 kg (08/27 0404) Last BM Date: 09/30/18   General: Elderly   Elderly white female in NAD.  NG in place and clamped up in chair, in good spirits, mentating well Heart:  irregular rate and rhythm; no murmurs Lungs: Respirations even and unlabored, lungs CTA bilaterally Abdomen:  Soft, nontender and very mildly distended.  bowel sounds present Extremities:  Without edema. Neurologic:  Alert and oriented,  grossly normal neurologically. Psych:  Cooperative. Normal mood and affect.  Intake/Output from previous day: 08/26 0701 - 08/27 0700 In: -  Out: [id number] [Urine:1650] Intake/Output this shift: Total I/O In: 3 [I.V.:3] Out: -   Lab Results: Recent Labs    09/29/18 0238 09/30/18 0830 10/01/18 0341  WBC 10.0 8.9 9.7  HGB 12.7 12.4 12.9  HCT 38.5 37.6 39.2  PLT 304 323 329   BMET Recent Labs    09/29/18 0238 09/30/18 0830 10/01/18 0341  NA 129* 129* 129*  K 3.9 3.5 3.7  CL 94* 93* 93*  CO2 24 22 22   GLUCOSE 109* 109* 95  BUN 7* 9 10  CREATININE 1.06* 0.92 0.92  CALCIUM 8.6* 8.8* 8.8*   LFT No results for input(s): PROT, ALBUMIN, AST, ALT, ALKPHOS, BILITOT, BILIDIR, IBILI in the last  72 hours. PT/INR No results for input(s): LABPROT, INR in the last 72 hours.  Studies/Results: Dg Abd 1 View  Result Date: 09/30/2018 CLINICAL DATA:  ileus EXAM: ABDOMEN - 1 VIEW COMPARISON:  Abdominal radiographs dated 09/29/2018, 09/28/2018 FINDINGS: A nasogastric tube courses below the diaphragm with side port projecting over the distal stomach. Unchanged bowel gas pattern with multiple dilated loops of bowel in the mid abdomen likely colon. No supine evidence for free air. Small left pleural effusion. IMPRESSION: Similar bowel gas pattern with multiple dilated loops of likely large bowel possibly representing ileus or obstruction. Electronically Signed   By: Audie Pinto M.D.   On: 09/30/2018 10:59   Dg Abd 1 View  Result Date: 09/29/2018 CLINICAL DATA:  Ileus. EXAM: ABDOMEN - 1 VIEW COMPARISON:  09/28/2018.  CT 09/24/2018. FINDINGS: NG tube noted in the distal stomach/proximal duodenum persistent colonic distention. Colonic ileus or obstruction could present this fashion. No interim change noted from prior exam. No free air. Postsurgical changes left hip. Degenerative changes lumbar spine and both hips. IMPRESSION: 1. NG tube noted with its tip in the distal stomach/proximal duodenum. 2. Persistent colonic distention consistent with colonic ileus or obstruction. No interim change from prior exam. Electronically Signed   By: [place]   On: 09/29/2018 14:48       Assessment /  Plan:    #19 85 year old white female with with chronic constipation and prior history of colonic ileus with persistent moderate ileus in setting of acute illness while admitted for pneumonia, hypoxic respiratory failure, and atrial fib flutter  She is improving, has tolerated NG clamped for 24 hours, abdomen benign and had liquid bowel movement on her own today  #2 bilateral pleural effusions #3 hypokalemia resolved #4 persistent atrial fibrillation- #5 moderate aortic stenosis, normal EF-episodes of  increased work of breathing and complaints of dyspnea and alleviated with Lasix. Patient had been on as needed Lasix at home long-term. As per cardiology would favor continuing low-dose Lasix i.e. 20 mg daily, then may need potassium supplementation also  Plan:  DC NG tube Start full liquid diet Continue MiraLAX twice daily, and give with prune juice Repeat abdominal films today   Principal Problem:   Atrial fibrillation with RVR (HCC) Active Problems:   Moderate aortic stenosis   Ileus (HCC)   Pleural effusion   Abdominal distention   Pneumonia   Nasogastric tube present   Respiratory difficulty    LOS: 14 days   Amy EsterwoodPA-C  10/01/2018, 10:35 AM      Attending Physician Note   I have taken an interval history, reviewed the chart and examined the patient. I agree with the Advanced Practitioner's note, impression and recommendations.   Chronic constipation, colonic ileus - both improving  Some degree of colon dilation is likely chronic  Advance diet to full liquids and then advance very gradually Miralax bid long term Prune juice daily to bid long term Mobilize patient as appropriate  Ideal to keep K > 4 and Mg > 2 Outpatient follow up with Dr. Scarlette Shorts as needed GI signing off  Lucio Edward, MD [facility]

## 2018-10-01 NOTE — Progress Notes (Signed)
Subjective: Ms. Deheer is doing okay this AM. She continues to have some throat discomfort from the tube. She is not feeling nauseous currently. She has not tried any PO intake. We discussed continuing to coordinate care with GI and cardiology. We also discussed trying to advance her diet. All questions and concerns addressed.   Objective: Vital signs in last 24 hours: Vitals:   10/01/18 0022 10/01/18 0200 10/01/18 0404 10/01/18 0742  BP: (!) 141/57 139/66 (!) 149/71 (!) 153/74  Pulse: 69 74 (!) 52 64  Resp: 20 17 16 18   Temp: (!) 97.4 F (36.3 C)  (!) 97.4 F (36.3 C) (!) 97.5 F (36.4 C)  TempSrc: Oral  Oral Oral  SpO2: 97% 95% 96% 97%  Weight:   91.9 kg   Height:       Physical Exam Vitals signs reviewed.  Constitutional:      General: She is not in acute distress.    Appearance: She is normal weight. She is ill-appearing. She is not toxic-appearing.  HENT:     Head: Normocephalic and atraumatic.     Nose:     Comments: NG tube in place Eyes:     General: No scleral icterus.       Right eye: No discharge.        Left eye: No discharge.     Extraocular Movements: Extraocular movements intact.  Cardiovascular:     Rate and Rhythm: Regular rhythm. Bradycardia present.     Pulses: Normal pulses.     Heart sounds: Normal heart sounds. No murmur. No friction rub. No gallop.   Pulmonary:     Effort: Pulmonary effort is normal. No respiratory distress.     Breath sounds: Wheezing (diffuse wheezing with expirations ) present. No rales.  Abdominal:     General: Abdomen is flat. Bowel sounds are normal. There is distension.     Tenderness: There is abdominal tenderness (TTP in periubmilical, suprapubic region).  Musculoskeletal:        General: No swelling or deformity.  Skin:    General: Skin is dry.  Neurological:     General: No focal deficit present.     Mental Status: She is alert. Mental status is at baseline.  Psychiatric:        Mood and Affect: Mood normal.     Assessment/Plan:  Principal Problem:   Atrial fibrillation with RVR (HCC) Active Problems:   Moderate aortic stenosis   Ileus (HCC)   Pleural effusion   Abdominal distention   Pneumonia   Nasogastric tube present   Respiratory difficulty  In summary, Mrs. Hinkelman is a 85 y/o female with a PMHx of HTN and ileus who was admitted for new onset A. Fib with RVR. She developed some emesis initially on admission with possible aspiration, and was treated for aspiration pneumonia. Hospital stay has also been complicated by ileus development.  #Atrial fibrillation with atrial flutter CHADsVAC 5-6 with HASBLED of 1. Echocardiogramon 8/14with normal LVEF, no LAE/RAE, but moderate AS.  - Continue with Diltiazem drip and Heparin until patient can tolerate PO again.  - Cardiologyon board and we appreciate the recommendations - Replace K to maintain >4.0 and Mg >2.0 - Diltiazem IV  - Heparin per pharmacy - Metoprolol PRN for HR >120   #Ileus Patient says she does not feel nauseous at the moment.  She does not like getting enemas, however we reinforced how important it is to get them as needed in order for the ileus  to resolve.  Abdominal x-ray was obtained yesterday for NG tube placement confirmation.  Notable for a large bowel dilation, suggesting ileus still present. - GI on board and we appreciate their recommendations - Maintain K >4.0 - PRN zofran for nausea  - IV protonix 40 mg QD  - Miralax BID through NG  #Acute Respiratory Failure #Pneumonia:Resolved The patient continues to tolerate room air with saturation in the low 90s overnight. No additional bouts of respiratory distress since 08/24. The patient continues to have diffuse wheezing on pulmonary exam, which has been present since admission. Wondering if there is a component of chronic lung disease, undiagnosed.  - Levalbuterol PRN for wheezing.   #Hypertension - Discontinued Amlodipine a few days after admission to soft  BPs. She is on IV Dilt for A. Fib. No new medications at this time.   Dispo: Anticipated dischargepending medical improvement.   Earlene Plater, MD Internal Medicine, PGY1 Pager: [phone]  10/01/2018,9:52 AM

## 2018-10-01 NOTE — Progress Notes (Signed)
Progress Note  Patient Name: [name] Date of Encounter: 10/01/2018  Primary Cardiologist: Shelva Majestic, MD   Subjective   Had another episode yesterday of what appeared to be respiratory distress.  Son in room.  IV Lasix administered.  Inpatient Medications    Scheduled Meds: . feeding supplement  1 Container Oral TID BM  . multivitamin with minerals  1 tablet Oral Daily  . polyethylene glycol  17 g Oral BID  . sodium chloride flush  3 mL Intravenous Q12H   Continuous Infusions: . diltiazem (CARDIZEM) infusion 15 mg/hr (09/30/18 2203)  . famotidine (PEPCID) IV 20 mg (09/30/18 2300)  . heparin 1,250 Units/hr (09/30/18 1436)  . magnesium sulfate bolus IVPB 2 g (10/01/18 0943)  . potassium chloride 10 mEq (10/01/18 0937)   PRN Meds: acetaminophen **OR** acetaminophen, capsaicin, levalbuterol, lip balm, metoprolol tartrate, metroNIDAZOLE, ondansetron **OR** ondansetron (ZOFRAN) IV, phenol, simethicone, sodium chloride flush   Vital Signs    Vitals:   10/01/18 0022 10/01/18 0200 10/01/18 0404 10/01/18 0742  BP: (!) 141/57 139/66 (!) 149/71 (!) 153/74  Pulse: 69 74 (!) 52 64  Resp: 20 17 16 18   Temp: (!) 97.4 F (36.3 C)  (!) 97.4 F (36.3 C) (!) 97.5 F (36.4 C)  TempSrc: Oral  Oral Oral  SpO2: 97% 95% 96% 97%  Weight:   91.9 kg   Height:        Intake/Output Summary (Last 24 hours) at 10/01/2018 0948 Last data filed at 10/01/2018 0946 Gross per 24 hour  Intake 3 ml  Output 1650 ml  Net -1647 ml   Last 3 Weights 10/01/2018 09/30/2018 09/29/2018  Weight (lbs) 202 lb 9.6 oz 201 lb 8 oz 205 lb 0.4 oz  Weight (kg) 91.9 kg 91.4 kg 93 kg      Telemetry    Atrial fibrillation heart rate in the 70s and 80s- Personally Reviewed  ECG    No new- Personally Reviewed  Physical Exam   GEN: No acute distress.  Elderly Neck: No JVD Cardiac:  Irregularly irregular, 2/6 systolic murmur, no rubs, or gallops.  Respiratory: Clear to auscultation bilaterally. GI: Soft,  nontender, non-distended  MS: No edema; No deformity. Neuro:  Nonfocal  Psych: Normal affect   Labs    High Sensitivity Troponin:   Recent Labs  Lab 09/10/2018 1015 09/24/2018 1235 09/21/18 0407 09/21/18 0923 09/21/18 1107  TROPONINIHS 45* 48* 14 11 13       Chemistry Recent Labs  Lab 09/29/18 0238 09/30/18 0830 10/01/18 0341  NA 129* 129* 129*  K 3.9 3.5 3.7  CL 94* 93* 93*  CO2 24 22 22   GLUCOSE 109* 109* 95  BUN 7* 9 10  CREATININE 1.06* 0.92 0.92  CALCIUM 8.6* 8.8* 8.8*  GFRNONAA 46* 54* 54*  GFRAA 53* >60 >60  ANIONGAP 11 14 14      Hematology Recent Labs  Lab 09/29/18 0238 09/30/18 0830 10/01/18 0341  WBC 10.0 8.9 9.7  RBC 4.21 4.13 4.32  HGB 12.7 12.4 12.9  HCT 38.5 37.6 39.2  MCV 91.4 91.0 90.7  MCH 30.2 30.0 29.9  MCHC 33.0 33.0 32.9  RDW 12.5 12.4 12.5  PLT 304 323 329    BNPNo results for input(s): BNP, PROBNP in the last 168 hours.   DDimer No results for input(s): DDIMER in the last 168 hours.   Radiology    Dg Abd 1 View  Result Date: 09/30/2018 CLINICAL DATA:  ileus EXAM: ABDOMEN - 1 VIEW COMPARISON:  Abdominal radiographs dated 09/29/2018, 09/28/2018 FINDINGS: A nasogastric tube courses below the diaphragm with side port projecting over the distal stomach. Unchanged bowel gas pattern with multiple dilated loops of bowel in the mid abdomen likely colon. No supine evidence for free air. Small left pleural effusion. IMPRESSION: Similar bowel gas pattern with multiple dilated loops of likely large bowel possibly representing ileus or obstruction. Electronically Signed   By: Audie Pinto M.D.   On: 09/30/2018 10:59   Dg Abd 1 View  Result Date: 09/29/2018 CLINICAL DATA:  Ileus. EXAM: ABDOMEN - 1 VIEW COMPARISON:  09/28/2018.  CT 09/24/2018. FINDINGS: NG tube noted in the distal stomach/proximal duodenum persistent colonic distention. Colonic ileus or obstruction could present this fashion. No interim change noted from prior exam. No free  air. Postsurgical changes left hip. Degenerative changes lumbar spine and both hips. IMPRESSION: 1. NG tube noted with its tip in the distal stomach/proximal duodenum. 2. Persistent colonic distention consistent with colonic ileus or obstruction. No interim change from prior exam. Electronically Signed   By: [place]   On: 09/29/2018 14:48    Cardiac Studies   EF 60%.  Moderate aortic stenosis.   Patient Profile     85 y.o. female with ileus, atrial fibrillation, normal EF, moderate aortic stenosis, transient delirium  Assessment & Plan    Persistent atrial fibrillation -Continue with rate control with IV diltiazem.  Unable to take p.o. at this time.  Heart rate well controlled.  Telemetry personally reviewed.  No major changes made.  Chronic anticoagulation -Currently on IV heparin because she is unable to take p.o.  When able, transition to [place].  No changes  Aortic stenosis -Moderate.  At this time should be of no clinical significance.  Normal EF.  No changes  Delirium -Also has had periods of what appears to be respiratory distress.  Perhaps this was alleviated with Lasix previously.  I wonder if anxiety is also playing a role.  Nonetheless, Lasix is a pretty benign treatment and seems to be helping.  Continue to monitor creatinine.  Ileus - Has been a chronic issue off and on.  NG tube.  Appreciate GIs assistance.  KUB still shows dilated large bowel  No new changes.     For questions or updates, please contact [place] Please consult [email or web address] for contact info under        Signed, Candee Furbish, MD  10/01/2018, 9:48 AM

## 2018-10-01 NOTE — Progress Notes (Signed)
10/01/18 1341  PT Visit Information  Last PT Received On 10/01/18  Assistance Needed +2  History of Present Illness 85 y.o. female with past medical history of hypertension, ileus, pericarditis admitted with atrial flutter with rapid ventricular response, increased dyspnea and chest pain.  Also with epigastric pain.  Echocardiogram shows normal LV function and moderate aortic stenosis.  Subjective Data  Patient Stated Goal to return home with son, decrease pain  Precautions  Precautions Fall  Restrictions  Weight Bearing Restrictions No  Pain Assessment  Pain Assessment No/denies pain  Cognition  Arousal/Alertness Awake/alert  Behavior During Therapy WFL for tasks assessed/performed  Overall Cognitive Status Within Functional Limits for tasks assessed  General Comments pt oriented x4, difficult to assess cognition further  Bed Mobility  General bed mobility comments Pt on 3N1 and NT asked PT to assist off of 3N1.   Transfers  Overall transfer level Needs assistance  Equipment used Rolling walker (2 wheeled)  Transfers Sit to/from [employer] Transfers  Sit to Stand +2 safety/equipment;Min assist  Stand pivot transfers Min assist;+2 safety/equipment  General transfer comment sit<>stand from 3N1 with min A for powerup with nurse cleaning pt and then pt took pivotal steps to recliner from 3N1.   Ambulation/Gait  Ambulation/Gait assistance Min assist;+2 safety/equipment  Gait Distance (Feet) 3 Feet  Assistive device Rolling walker (2 wheeled)  Gait Pattern/deviations Shuffle;Decreased step length - left;Decreased step length - right  General Gait Details pivotal steps to recliner from 3N1  Gait velocity decreased  Balance  Overall balance assessment Needs assistance  Sitting-balance support Feet supported  Sitting balance-Leahy Scale Good  Standing balance support Bilateral upper extremity supported;During functional activity  Standing balance-Leahy Scale Poor  Standing  balance comment relies on UE support  General Comments  General comments (skin integrity, edema, etc.) Pts son returned at end of this treatment when pt off 3N1.   PT - End of Session  Equipment Utilized During Treatment Gait belt  Activity Tolerance Patient tolerated treatment well;Patient limited by fatigue  Patient left with call bell/phone within reach;in chair;with family/visitor present  Nurse Communication Mobility status (pt on 3N1)   PT - Assessment/Plan  PT Plan Current plan remains appropriate;Frequency needs to be updated  PT Visit Diagnosis Muscle weakness (generalized) (M62.81);Difficulty in walking, not elsewhere classified (R26.2);Other abnormalities of gait and mobility (R26.89);Pain;Unsteadiness on feet (R26.81)  Pain - Right/Left  (NG tube discomfort)  Pain - part of body Leg (bottom, vaginal area, legs)  PT Frequency (ACUTE ONLY) Min 3X/week  Follow Up Recommendations Home health PT;Supervision for mobility/OOB;Supervision/Assistance - 24 hour  PT equipment None recommended by PT  AM-PAC PT "6 Clicks" Mobility Outcome Measure (Version 2)  Help needed turning from your back to your side while in a flat bed without using bedrails? 3  Help needed moving from lying on your back to sitting on the side of a flat bed without using bedrails? 3  Help needed moving to and from a bed to a chair (including a wheelchair)? 2  Help needed standing up from a chair using your arms (e.g., wheelchair or bedside chair)? 2  Help needed to walk in hospital room? 2  Help needed climbing 3-5 steps with a railing?  1  6 Click Score 13  Consider Recommendation of Discharge To: CIR/SNF/LTACH  PT Goal Progression  Progress towards PT goals Progressing toward goals  PT Time Calculation  PT Start Time (ACUTE ONLY) 1105  PT Stop Time (ACUTE ONLY) 1114  PT Time  Calculation (min) (ACUTE ONLY) 9 min  PT General Charges  $$ ACUTE PT VISIT 1 Visit  PT Treatments  $Therapeutic Activity 8-22 mins   Assisted NT with assisting pt off 3N1 to recliner.  Thanks. [place] Pager:  [phone]  Office:  [phone]

## 2018-10-01 NOTE — Progress Notes (Signed)
ANTICOAGULATION CONSULT NOTE  Pharmacy Consult:  Heparin Indication: atrial fibrillation  Allergies  Allergen Reactions  . Diclofenac Sodium Anaphylaxis  . Ibuprofen Anaphylaxis  . Lisinopril     Fall hazard, dizzy  . Adhesive [Tape]   . Codeine Nausea Only  . Tizanidine     "knocked out cold"    Patient Measurements: Height: 5\' 6"  (0000000 cm) Weight: 202 lb 9.6 oz (91.9 kg) IBW/kg (Calculated) : 59.3 Heparin Dosing Weight: 80 kg  Vital Signs: Temp: 97.5 F (36.4 C) (08/27 0742) Temp Source: Oral (08/27 0742) BP: 153/74 (08/27 0742) Pulse Rate: 64 (08/27 0742)  Labs: Recent Labs    09/29/18 0238 09/30/18 0830 10/01/18 0341  HGB 12.7 12.4 12.9  HCT 38.5 37.6 39.2  PLT 304 323 329  HEPARINUNFRC 0.31 0.63 0.79*  CREATININE 1.06* 0.92 0.92    Estimated Creatinine Clearance: 45.5 mL/min (by C-G formula based on SCr of 0.92 mg/dL).  Assessment: 27 YOF with new atrial fibrillation this admit. Received IV heparin 8/14>>8/18, when changed to Eliquis 5 mg PO BID.  Meds back to IV heparin on 09/24/18 due to ileus, unable to take po.    Heparin level  Up to 0.79, supratherapeutic on heparin drip rate 1250 units/hr. No bleeding noted confirmed with RN.  Will reduce heparin rate.  Cardiologist recommended to transition to [place] when patient able to take po.  Goal of Therapy:  Heparin level 0.3-0.7 units/mL Monitor platelets by anticoagulation protocol: Yes   Plan:  Decrease heparin gtt to 1150 units/hr Check a 6 hour heparin level.  Daily heparin level and CBC   Nicole Cella,  Clinical Pharmacist [phone] Please check AMION for all [place] phone numbers After 10:00 PM, call [place] [phone] 10/01/2018, 10:48 AM

## 2018-10-01 NOTE — Progress Notes (Signed)
Physical Therapy Treatment Patient Details Name: [name] MRN: [id number] DOB: [birth year] Today's Date: 10/01/2018    History of Present Illness 85 y.o. female with past medical history of hypertension, ileus, pericarditis admitted with atrial flutter with rapid ventricular response, increased dyspnea and chest pain.  Also with epigastric pain.  Echocardiogram shows normal LV function and moderate aortic stenosis.    PT Comments    Pt admitted with above diagnosis. Pt encouraged to get OOB.  Pt having liquid stools once began movement therefore got pt on 3N1 and asked her to call nursing to help her off as she wanted to sit awhile.  Pt needs min assist +2 for safety with mobility.   Pt currently with functional limitations due to balance and endurance deficits. Pt will benefit from skilled PT to increase their independence and safety with mobility to allow discharge to the venue listed below.     Follow Up Recommendations  Home health PT;Supervision for mobility/OOB;Supervision/Assistance - 24 hour     Equipment Recommendations  None recommended by PT    Recommendations for Other Services       Precautions / Restrictions Precautions Precautions: Fall Restrictions Weight Bearing Restrictions: No    Mobility  Bed Mobility Overal bed mobility: Needs Assistance Bed Mobility: Supine to Sit;Sit to Supine     Supine to sit: Min assist;+2 for safety/equipment     General bed mobility comments: requires cues for hand placement and assist for trunk elevation.  When removing purewick pt stated," I think I am pooping."  noted very loose stool on pad.  Placed towel between legs to limit spillage of stool when pt stands.   Transfers Overall transfer level: Needs assistance Equipment used: Rolling walker (2 wheeled) Transfers: Sit to/from Stand;Squat Pivot Transfers;Stand Pivot Transfers Sit to Stand: +2 safety/equipment;Min assist   Squat pivot transfers: Min assist;+2  safety/equipment     General transfer comment: sit<>stand from EOB with min A for powerup and squat pivot to 3N1 with min assist  Ambulation/Gait                 Stairs             Wheelchair Mobility    Modified Rankin (Stroke Patients Only)       Balance Overall balance assessment: Needs assistance Sitting-balance support: Feet supported Sitting balance-Leahy Scale: Good     Standing balance support: Bilateral upper extremity supported;During functional activity Standing balance-Leahy Scale: Poor Standing balance comment: relies on UE support                            Cognition Arousal/Alertness: Awake/alert Behavior During Therapy: WFL for tasks assessed/performed Overall Cognitive Status: Within Functional Limits for tasks assessed                                 General Comments: pt oriented x4, difficult to assess cognition further      Exercises      General Comments General comments (skin integrity, edema, etc.): Pts son left while pt using 3N1      Pertinent Vitals/Pain Pain Assessment: Faces Faces Pain Scale: Hurts little more Pain Location: NG tube Pain Descriptors / Indicators: Grimacing;Guarding;Discomfort Pain Intervention(s): Limited activity within patient's tolerance;Monitored during session;Repositioned  VSS  Home Living  Prior Function            PT Goals (current goals can now be found in the care plan section) Acute Rehab PT Goals Patient Stated Goal: to return home with son, decrease pain Progress towards PT goals: Progressing toward goals    Frequency    Min 3X/week      PT Plan Current plan remains appropriate;Frequency needs to be updated    Co-evaluation              AM-PAC PT "6 Clicks" Mobility   Outcome Measure  Help needed turning from your back to your side while in a flat bed without using bedrails?: A Little Help needed moving from  lying on your back to sitting on the side of a flat bed without using bedrails?: A Little Help needed moving to and from a bed to a chair (including a wheelchair)?: A Lot Help needed standing up from a chair using your arms (e.g., wheelchair or bedside chair)?: A Lot Help needed to walk in hospital room?: A Lot Help needed climbing 3-5 steps with a railing? : Total 6 Click Score: 13    End of Session Equipment Utilized During Treatment: Gait belt Activity Tolerance: Patient tolerated treatment well;Patient limited by fatigue Patient left: with call bell/phone within reach(on 3N1) Nurse Communication: Mobility status(pt on 3N1) PT Visit Diagnosis: Muscle weakness (generalized) (M62.81);Difficulty in walking, not elsewhere classified (R26.2);Other abnormalities of gait and mobility (R26.89);Pain;Unsteadiness on feet (R26.81) Pain - Right/Left: (NG tube discomfort) Pain - part of body: Leg(bottom, vaginal area, legs)     Time: [id number] PT Time Calculation (min) (ACUTE ONLY): 21 min  Charges:  $Therapeutic Activity: 8-22 mins                     [name],PT Acute Rehabilitation Services Pager:  [phone]  Office:  [place] 10/01/2018, 1:38 PM

## 2018-10-01 NOTE — Progress Notes (Signed)
ANTICOAGULATION CONSULT NOTE  Pharmacy Consult:  Heparin Indication: atrial fibrillation  Allergies  Allergen Reactions  . Diclofenac Sodium Anaphylaxis  . Ibuprofen Anaphylaxis  . Lisinopril     Fall hazard, dizzy  . Adhesive [Tape]   . Codeine Nausea Only  . Tizanidine     "knocked out cold"    Patient Measurements: Height: 5\' 6"  (167.6 cm) Weight: 202 lb 9.6 oz (91.9 kg) IBW/kg (Calculated) : 59.3 Heparin Dosing Weight: 80 kg  Vital Signs: Temp: 97.6 F (36.4 C) (08/27 1246) Temp Source: Oral (08/27 1246) BP: 137/70 (08/27 1500) Pulse Rate: 70 (08/27 1500)  Labs: Recent Labs    09/29/18 0238 09/30/18 0830 10/01/18 0341 10/01/18 1706  HGB 12.7 12.4 12.9  --   HCT 38.5 37.6 39.2  --   PLT 304 323 329  --   HEPARINUNFRC 0.31 0.63 0.79* 0.72*  CREATININE 1.06* 0.92 0.92  --     Estimated Creatinine Clearance: 45.5 mL/min (by C-G formula based on SCr of 0.92 mg/dL).  Assessment: 3 YOF with new atrial fibrillation this admit. Received IV heparin 8/14>>8/18, when changed to Eliquis 5 mg PO BID.  Meds back to IV heparin on 09/24/18 due to ileus, unable to take po.    Heparin level still above goal at 0.72, on heparin drip rate 1150 units/hr. No bleeding noted.  Will reduce heparin rate again.  Cardiologist recommended to transition to [place] when patient able to take po.  Goal of Therapy:  Heparin level 0.3-0.7 units/mL Monitor platelets by anticoagulation protocol: Yes   Plan:  Decrease heparin gtt to 1000 units/hr Daily heparin level and CBC  Erin Hearing PharmD., BCPS Clinical Pharmacist 10/01/2018 5:58 PM

## 2018-10-02 DIAGNOSIS — Z8701 Personal history of pneumonia (recurrent): Secondary | ICD-10-CM

## 2018-10-02 LAB — CBC
HCT: 36.3 % (ref 36.0–46.0)
Hemoglobin: 12.3 g/dL (ref 12.0–15.0)
MCH: 30.1 pg (ref 26.0–34.0)
MCHC: 33.9 g/dL (ref 30.0–36.0)
MCV: 88.8 fL (ref 80.0–100.0)
Platelets: 309 10*3/uL (ref 150–400)
RBC: 4.09 MIL/uL (ref 3.87–5.11)
RDW: 12.3 % (ref 11.5–15.5)
WBC: 9.3 10*3/uL (ref 4.0–10.5)
nRBC: 0 % (ref 0.0–0.2)

## 2018-10-02 LAB — BASIC METABOLIC PANEL
Anion gap: 10 (ref 5–15)
Anion gap: 10 (ref 5–15)
BUN: 11 mg/dL (ref 8–23)
BUN: 9 mg/dL (ref 8–23)
CO2: 21 mmol/L — ABNORMAL LOW (ref 22–32)
CO2: 21 mmol/L — ABNORMAL LOW (ref 22–32)
Calcium: 8.5 mg/dL — ABNORMAL LOW (ref 8.9–10.3)
Calcium: 8.9 mg/dL (ref 8.9–10.3)
Chloride: 93 mmol/L — ABNORMAL LOW (ref 98–111)
Chloride: 92 mmol/L — ABNORMAL LOW (ref 98–111)
Creatinine, Ser: 0.81 mg/dL (ref 0.44–1.00)
Creatinine, Ser: 0.86 mg/dL (ref 0.44–1.00)
GFR calc Af Amer: >60 mL/min (ref >60)
GFR calc Af Amer: >60 mL/min (ref >60)
GFR calc non Af Amer: >60 mL/min (ref >60)
GFR calc non Af Amer: 59 mL/min — ABNORMAL LOW (ref >60)
Glucose, Bld: 111 mg/dL — ABNORMAL HIGH (ref 70–99)
Glucose, Bld: 136 mg/dL — ABNORMAL HIGH (ref 70–99)
Potassium: 3.9 mmol/L (ref 3.5–5.1)
Potassium: 4.1 mmol/L (ref 3.5–5.1)
Sodium: 124 mmol/L — ABNORMAL LOW (ref 135–145)
Sodium: 123 mmol/L — ABNORMAL LOW (ref 135–145)

## 2018-10-02 LAB — HEPARIN LEVEL (UNFRACTIONATED): Heparin Unfractionated: 0.38 IU/mL (ref 0.30–0.70)

## 2018-10-02 LAB — MAGNESIUM: Magnesium: 1.9 mg/dL (ref 1.7–2.4)

## 2018-10-02 MED ORDER — POTASSIUM CHLORIDE 20 MEQ/15ML (10%) PO SOLN
40.0000 meq | Freq: Once | ORAL | Status: AC
  Administered 2018-10-02: 10:00:00 40 meq via ORAL
  Filled 2018-10-02: qty 30

## 2018-10-02 MED ORDER — SODIUM CHLORIDE 0.9 % IV SOLN
INTRAVENOUS | Status: DC
  Administered 2018-10-02: 09:00:00 via INTRAVENOUS

## 2018-10-02 MED ORDER — POTASSIUM CHLORIDE CRYS ER 20 MEQ PO TBCR: 40.0000 meq | EXTENDED_RELEASE_TABLET | Freq: Once | ORAL | Status: DC

## 2018-10-02 MED ORDER — FUROSEMIDE 40 MG PO TABS
40.0000 mg | ORAL_TABLET | Freq: Every day | ORAL | Status: DC
  Administered 2018-10-02 – 2018-10-05 (×4): 40 mg via ORAL
  Filled 2018-10-02 (×4): qty 1

## 2018-10-02 MED ORDER — IPRATROPIUM-ALBUTEROL 0.5-2.5 (3) MG/3ML IN SOLN
3.0000 mL | Freq: Once | RESPIRATORY_TRACT | Status: AC
  Administered 2018-10-02: 3 mL via RESPIRATORY_TRACT
  Filled 2018-10-02: qty 3

## 2018-10-02 MED ORDER — MAGNESIUM SULFATE 2 GM/50ML IV SOLN
2.0000 g | Freq: Once | INTRAVENOUS | Status: AC
  Administered 2018-10-02: 2 g via INTRAVENOUS
  Filled 2018-10-02: qty 50

## 2018-10-02 MED ORDER — ALBUTEROL SULFATE (2.5 MG/3ML) 0.083% IN NEBU
2.5000 mg | INHALATION_SOLUTION | RESPIRATORY_TRACT | Status: DC | PRN
  Administered 2018-10-02: 21:00:00 2.5 mg via RESPIRATORY_TRACT
  Filled 2018-10-02: qty 3

## 2018-10-02 NOTE — Progress Notes (Addendum)
Progress Note  Patient Name: [name] Date of Encounter: 10/02/2018  Primary Cardiologist: Shelva Majestic, MD   Subjective   Abdomen still distended.  No chest pain no palpitations  Inpatient Medications    Scheduled Meds: . feeding supplement  1 Container Oral TID BM  . multivitamin with minerals  1 tablet Oral Daily  . polyethylene glycol  17 g Oral BID  . potassium chloride  40 mEq Oral Once  . sodium chloride flush  3 mL Intravenous Q12H   Continuous Infusions: . sodium chloride 50 mL/hr at 10/02/18 0923  . diltiazem (CARDIZEM) infusion 15 mg/hr (10/02/18 0947)  . famotidine (PEPCID) IV Stopped (10/01/18 2215)  . heparin 1,000 Units/hr (10/02/18 0400)  . magnesium sulfate bolus IVPB 2 g (10/02/18 0927)   PRN Meds: acetaminophen **OR** acetaminophen, albuterol, capsaicin, lip balm, magic mouthwash, metoprolol tartrate, metroNIDAZOLE, ondansetron **OR** ondansetron (ZOFRAN) IV, phenol, simethicone, sodium chloride flush   Vital Signs    Vitals:   10/01/18 1500 10/01/18 2007 10/01/18 2358 10/02/18 0351  BP: 137/70 (!) 128/57 (!) 130/56 115/70  Pulse: 70 80 73 (!) 41  Resp: (!) 24 (!) 22 19 17   Temp:  97.7 F (36.5 C) (!) 97.5 F (36.4 C) 97.7 F (36.5 C)  TempSrc:  Oral Oral Axillary  SpO2: 96% 100% 97% 96%  Weight:    91.5 kg  Height:        Intake/Output Summary (Last 24 hours) at 10/02/2018 0957 Last data filed at 10/02/2018 0400 Gross per 24 hour  Intake 1585.68 ml  Output -  Net 1585.68 ml   Last 3 Weights 10/02/2018 10/01/2018 09/30/2018  Weight (lbs) 201 lb 11.5 oz 202 lb 9.6 oz 201 lb 8 oz  Weight (kg) 91.5 kg 91.9 kg 91.4 kg      Telemetry    Atrial fibrillation well rate controlled 70s no significant pauses- Personally Reviewed  ECG    No new- Personally Reviewed  Physical Exam   GEN: No acute distress.   Neck: No JVD NG tube removed Cardiac:  Irregularly irregular, no murmurs, rubs, or gallops.  Respiratory: Clear to auscultation  bilaterally. GI: Soft, nontender, ended MS: No edema; No deformity. Neuro:  Nonfocal  Psych: Normal affect   Labs    High Sensitivity Troponin:   Recent Labs  Lab 09/18/2018 1015 09/07/2018 1235 09/21/18 0407 09/21/18 0923 09/21/18 1107  TROPONINIHS 45* 48* 14 11 13       Chemistry Recent Labs  Lab 09/30/18 0830 10/01/18 0341 10/02/18 0428  NA 129* 129* 124*  K 3.5 3.7 3.9  CL 93* 93* 93*  CO2 22 22 21*  GLUCOSE 109* 95 111*  BUN 9 10 11   CREATININE 0.92 0.92 0.81  CALCIUM 8.8* 8.8* 8.5*  GFRNONAA 54* 54* >60  GFRAA >60 >60 >60  ANIONGAP 14 14 10      Hematology Recent Labs  Lab 09/30/18 0830 10/01/18 0341 10/02/18 0428  WBC 8.9 9.7 9.3  RBC 4.13 4.32 4.09  HGB 12.4 12.9 12.3  HCT 37.6 39.2 36.3  MCV 91.0 90.7 88.8  MCH 30.0 29.9 30.1  MCHC 33.0 32.9 33.9  RDW 12.4 12.5 12.3  PLT 323 329 309    BNPNo results for input(s): BNP, PROBNP in the last 168 hours.   DDimer No results for input(s): DDIMER in the last 168 hours.   Radiology    Dg Abd 1 View  Result Date: 10/01/2018 CLINICAL DATA:  85 year old female with a history ileus EXAM: ABDOMEN -  1 VIEW COMPARISON:  September 30, 2018, September 29, 2010 FINDINGS: Similar appearance of distended colonic loops with relative absence of stomach gas in small bowel gas. Interval removal of the gastric tube. No rectal gas observed. IMPRESSION: Unchanged appearance of the gas pattern with colonic distention and interval removal of the gastric tube. Electronically Signed   By: Corrie Mckusick D.O.   On: 10/01/2018 15:46    Cardiac Studies   Moderate aortic stenosis, EF 60%  Patient Profile     85 y.o. female with ileus, atrial fibrillation normal ejection fraction moderate aortic stenosis transient delirium  Assessment & Plan    Persistent atrial fibrillation - IV diltiazem - IV heparin - Transition to p.o. when able  Chronic anticoagulation - Transition to NOAC when able  Aortic stenosis -Moderate.  We will  continue to monitor as outpatient.  Ileus -NG tube removed GI following.  Trying to advance diet     For questions or updates, please contact [place] Please consult [email or web address] for contact info under        Signed, Candee Furbish, MD  10/02/2018, 9:57 AM

## 2018-10-02 NOTE — Progress Notes (Signed)
Occupational Therapy Treatment Patient Details Name: [name] MRN: [id number] DOB: 1924-08-07 Today's Date: 10/02/2018    History of present illness 85 y.o. female with past medical history of hypertension, ileus, pericarditis admitted with atrial flutter with rapid ventricular response, increased dyspnea and chest pain.  Also with epigastric pain.  Echocardiogram shows normal LV function and moderate aortic stenosis.   OT comments  Pt. Seen for skilled OT session. Pt. And pts. Son who was present report that pt. Is not feeling well today.  States her stomach is bothering her and also she is SOB.  Agreeable to bed mobility for repositioning.  Pt. x2 person total assist. Son able to assist me.  Educated on in bed rom/exercises in prep for increasing independence with bed mobility when she feels better.  pts son requests HEP to be provided from each discipline at next session.  Will pass along.    Follow Up Recommendations  SNF;Home health OT;Supervision/Assistance - 24 hour    Equipment Recommendations  3 in 1 bedside commode    Recommendations for Other Services      Precautions / Restrictions Precautions Precautions: Fall       Mobility Bed Mobility Overal bed mobility: Needs Assistance             General bed mobility comments: pts. son present, assisted with bed mobility and repositioning.  x2 total to pull up in the bed and reposition  Transfers                 General transfer comment: pt. and son decline eob due to pts. sob and not feeling well today.  educated in exercises that simulate steps for rolling and bed mobility in prep for actual bed mobility when able    Balance                                           ADL either performed or assessed with clinical judgement   ADL Overall ADL's : Needs assistance/impaired                                       General ADL Comments: pt.s son present, very active caregiver.   keeps a journal in the room to document everything so he can keep up with and remember how to help her. 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     Vision       Perception     Praxis      Cognition Arousal/Alertness: Lethargic                                              Exercises Other Exercises Other Exercises: provided demo of rolling and reaching to each bed rail in prep for actual rolling for supine/sidelying/sit when pt. able.  also discussed w/c push ups if pt. seated eob as another good exercise for increasing functional mobility during sit/stands   Shoulder Instructions       General Comments      Pertinent Vitals/ Pain       Pain Location: pt. c/o SOB and distended stomach Pain Descriptors / Indicators: Grimacing;Guarding;Discomfort  Home Living  Prior Functioning/Environment              Frequency  Min 2X/week        Progress Toward Goals  OT Goals(current goals can now be found in the care plan section)  Progress towards OT goals: Progressing toward goals     Plan Discharge plan remains appropriate    Co-evaluation                 AM-PAC OT "6 Clicks" Daily Activity     Outcome Measure   Help from another person eating meals?: Total Help from another person taking care of personal grooming?: A Lot Help from another person toileting, which includes using toliet, bedpan, or urinal?: A Lot Help from another person bathing (including washing, rinsing, drying)?: A Lot Help from another person to put on and taking off regular upper body clothing?: A Lot Help from another person to put on and taking off regular lower body clothing?: A Lot 6 Click Score: 11    End of Session    OT Visit Diagnosis: Other abnormalities of gait and mobility (R26.89);History of falling (Z91.81);Pain;Feeding difficulties (R63.3)   Activity Tolerance Patient tolerated  treatment well;Patient limited by fatigue   Patient Left in bed;with call bell/phone within reach;with bed alarm set;with family/visitor present   Nurse Communication          Time: [id number] OT Time Calculation (min): 19 min  Charges: OT General Charges $OT Visit: 1 Visit OT Treatments $Self Care/Home Management : 8-22 mins   Janice Coffin, COTA/L 10/02/2018, 1:52 PM

## 2018-10-02 NOTE — Progress Notes (Signed)
ANTICOAGULATION CONSULT NOTE  Pharmacy Consult:  Heparin Indication: atrial fibrillation  Allergies  Allergen Reactions  . Diclofenac Sodium Anaphylaxis  . Ibuprofen Anaphylaxis  . Lisinopril     Fall hazard, dizzy  . Adhesive [Tape]   . Codeine Nausea Only  . Tizanidine     "knocked out cold"    Patient Measurements: Height: 5\' 6"  (167.6 cm) Weight: 201 lb 11.5 oz (91.5 kg) IBW/kg (Calculated) : 59.3 Heparin Dosing Weight: 80 kg  Vital Signs: Temp: 97.7 F (36.5 C) (08/28 0351) Temp Source: Axillary (08/28 0351) BP: 115/70 (08/28 0351) Pulse Rate: 41 (08/28 0351)  Labs: Recent Labs    09/30/18 0830 10/01/18 0341 10/01/18 1706 10/02/18 0428  HGB 12.4 12.9  --  12.3  HCT 37.6 39.2  --  36.3  PLT 323 329  --  309  HEPARINUNFRC 0.63 0.79* 0.72* 0.38  CREATININE 0.92 0.92  --  0.81    Estimated Creatinine Clearance: 51.6 mL/min (by C-G formula based on SCr of 0.81 mg/dL).  Assessment: 61 YOF with new atrial fibrillation this admit. Received IV heparin 8/14>>8/18, when changed to Eliquis 5 mg PO BID.  Meds back to IV heparin on 09/24/18 due to ileus, unable to take po.    Heparin level now within goal range. No bleeding noted.   Cardiologist recommended to transition to [place] when patient able to take po.  Goal of Therapy:  Heparin level 0.3-0.7 units/mL Monitor platelets by anticoagulation protocol: Yes   Plan:  Continue IV heparin at current rate. Daily heparin level and CBC F/u ability to take po, and begin Eliquis.  Marguerite Olea, [facility] Clinical Pharmacist Phone [phone]  10/02/2018 8:24 AM

## 2018-10-02 NOTE — Progress Notes (Signed)
Given information in report that patient has been having wheezing and is SOB. Verified this information with assessment. Patient due to eat some of full liquid diet- patient drank various juices and had some yogurt/pudding. Patient becoming increasingly SOB during eating. Lung sounds wheezy. Patient given one dose of Albuterol without much improvement. Auscultated wet lung sounds to right lung greater than left. Will notify IMTS team.   Milford Cage, RN

## 2018-10-02 NOTE — Progress Notes (Signed)
Paged by bedside RN regarding concerns of increased right sided crackles since shift change at 1900. Pt was eating when noted to have change. Albuterol neb given without change. Dr. Maricela Bo and myself assessed pt at bedside. Patient notes that she believes her breathing is somewhat better than an hour ago. She reports feeling comfortable and is without complaints.  VSS. Pt breathing comfortably on room air. Able to speak in full sentences. Diffuse expiratory wheezes throughout all lung fields. No crackles auscultated at time of our exam.   HRR. No peripheral edema of b/l lower extremities. Plan: [place]. Encouraged bedside RN to contact us with any changes or concerns. Appreciate her attentiveness.

## 2018-10-02 NOTE — Progress Notes (Signed)
Subjective:   Pt seen at the bedside on rounds today. Pt says she feels like her abdomen is distended and there is gas. Her breathing is much better today. Yesterday, she complained of her throat hurting while the NG tube was in, but now that it is out she is much more comfortable. She hasn't tried to eat or drink anything today yet.   We reinforced that she needs to take the eating one step at a time. Only liquids for now. She endorses understanding. We also let her know the goal is to transition from IV meds to PO meds and says she understands. All of the patient's questions have been answered.   Objective:  Vital signs in last 24 hours: Vitals:   10/01/18 1500 10/01/18 2007 10/01/18 2358 10/02/18 0351  BP: 137/70 (!) 128/57 (!) 130/56 115/70  Pulse: 70 80 73 (!) 41  Resp: (!) 24 (!) 22 19 17   Temp:  97.7 F (36.5 C) (!) 97.5 F (36.4 C) 97.7 F (36.5 C)  TempSrc:  Oral Oral Axillary  SpO2: 96% 100% 97% 96%  Weight:    91.5 kg  Height:       Physical Exam Vitals signs and nursing note reviewed.  Constitutional:      General: She is not in acute distress.    Appearance: She is obese. She is not toxic-appearing.  Neck:     Vascular: No JVD.  Cardiovascular:     Rate and Rhythm: Normal rate. Rhythm irregular.     Heart sounds: Murmur present.  Pulmonary:     Effort: Pulmonary effort is normal. No accessory muscle usage or respiratory distress.     Breath sounds: Wheezing present.  Abdominal:     General: Bowel sounds are normal.     Palpations: Abdomen is soft.  Skin:    General: Skin is warm and dry.  Neurological:     General: No focal deficit present.     Mental Status: She is alert and oriented to person, place, and time.  Psychiatric:        Mood and Affect: Mood normal. Mood is not anxious.        Behavior: Behavior normal. Behavior is not agitated.    Assessment/Plan:  Principal Problem:   Atrial fibrillation with RVR (HCC) Active Problems:   Moderate  aortic stenosis   Ileus (HCC)   Pleural effusion   Abdominal distention   Pneumonia   Nasogastric tube present   Respiratory difficulty  In summary, Mrs. Bolster is a 85 y/o female with a PMHx of HTN and ileus who was admitted for new onset A. Fib with RVR. She developed some emesis initially on admission with possible aspiration, and was treated for aspiration pneumonia. Hospital stay has also been complicated by ileus development.  #Atrial fibrillation with atrial flutter CHADsVAC 5-6 with HASBLED of 1. Echocardiogramon 8/14with normal LVEF, no LAE/RAE, but moderate AS.   - Cardiologyon board and we appreciate the recommendations - Replace K to maintain >4.0 and Mg >2.0 - Continue with Diltiazem drip and Heparin until patient can tolerate PO again. - Metoprolol PRN for HR >120   #Ileus Some symptomatic improvement, however patient continues to have distention and feeling of gassiness.  Tolerating liquids p.o. GI has recommended continuing MiraLAX twice daily and prune juice.  At this time they have signed off.  - Maintain K >4.0 - PRN zofran for nausea  - IV protonix 40 mg QD  - Miralax BID - Prune juice  daily, twice daily when tolerated  #Shortness of breath  No additional bouts of SOB since 08/24.The patient continues to have diffuse wheezing on pulmonary exam, which has been present since admission. Wondering if there is a component of chronic lung disease, undiagnosed. Patient and her son feel like Lasix is what keeps her shortness of breath controlled.  Dr. Marlou Porch feels it is safe enough to do a low-dose if it is giving her relief.  - Proventil PRNfor wheezing. - Lasix 40 mg daily p.o.   #Hypertension: Stable #Aspiration pneumonia:Resolved  Dispo: Anticipated dischargepending medical improvement.   Dr. Jose Persia Internal Medicine PGY-1  Pager: [phone] 10/02/2018, 2:08 PM

## 2018-10-02 NOTE — Care Management Important Message (Signed)
Important Message  Patient Details  Name: [name] MRN: [id number] Date of Birth: Jan 21, 1925   Medicare Important Message Given:  Yes     Shelda Altes 10/02/2018, 1:02 PM

## 2018-10-03 DIAGNOSIS — I5033 Acute on chronic diastolic (congestive) heart failure: Secondary | ICD-10-CM

## 2018-10-03 DIAGNOSIS — E871 Hypo-osmolality and hyponatremia: Secondary | ICD-10-CM

## 2018-10-03 LAB — BASIC METABOLIC PANEL
Anion gap: 10 (ref 5–15)
BUN: 9 mg/dL (ref 8–23)
CO2: 21 mmol/L — ABNORMAL LOW (ref 22–32)
Calcium: 9 mg/dL (ref 8.9–10.3)
Chloride: 92 mmol/L — ABNORMAL LOW (ref 98–111)
Creatinine, Ser: 0.98 mg/dL (ref 0.44–1.00)
GFR calc Af Amer: 58 mL/min — ABNORMAL LOW (ref >60)
GFR calc non Af Amer: 50 mL/min — ABNORMAL LOW (ref >60)
Glucose, Bld: 129 mg/dL — ABNORMAL HIGH (ref 70–99)
Potassium: 3.8 mmol/L (ref 3.5–5.1)
Sodium: 123 mmol/L — ABNORMAL LOW (ref 135–145)

## 2018-10-03 LAB — CBC
HCT: 35.9 % — ABNORMAL LOW (ref 36.0–46.0)
Hemoglobin: 12 g/dL (ref 12.0–15.0)
MCH: 30.3 pg (ref 26.0–34.0)
MCHC: 33.4 g/dL (ref 30.0–36.0)
MCV: 90.7 fL (ref 80.0–100.0)
Platelets: 316 10*3/uL (ref 150–400)
RBC: 3.96 MIL/uL (ref 3.87–5.11)
RDW: 12.4 % (ref 11.5–15.5)
WBC: 10.4 10*3/uL (ref 4.0–10.5)
nRBC: 0 % (ref 0.0–0.2)

## 2018-10-03 LAB — HEPARIN LEVEL (UNFRACTIONATED): Heparin Unfractionated: 0.38 IU/mL (ref 0.30–0.70)

## 2018-10-03 MED ORDER — POTASSIUM CHLORIDE 20 MEQ PO PACK
40.0000 meq | PACK | Freq: Once | ORAL | Status: AC
  Administered 2018-10-03: 40 meq via ORAL
  Filled 2018-10-03: qty 2

## 2018-10-03 MED ORDER — IPRATROPIUM-ALBUTEROL 0.5-2.5 (3) MG/3ML IN SOLN
3.0000 mL | Freq: Four times a day (QID) | RESPIRATORY_TRACT | Status: DC | PRN
  Administered 2018-10-04 – 2018-10-11 (×4): 3 mL via RESPIRATORY_TRACT
  Filled 2018-10-03 (×4): qty 3

## 2018-10-03 NOTE — Progress Notes (Signed)
Progress Note  Patient Name: [name] Date of Encounter: 10/03/2018  Primary Cardiologist: Shelva Majestic, MD   Subjective   She had some difficulty with shortness of breath last night but this has now resolved. Still has substantial abdominal distention, but is holding down liquids and has liquid stool. Excellent ventricular rate control, around 70 bpm. Good urine output, but net neutral fluid balance overnight.  Weight has reportedly gone up 6 pounds overnight, but I suspect this is an error.  Inpatient Medications    Scheduled Meds: . feeding supplement  1 Container Oral TID BM  . furosemide  40 mg Oral Daily  . multivitamin with minerals  1 tablet Oral Daily  . polyethylene glycol  17 g Oral BID  . sodium chloride flush  3 mL Intravenous Q12H   Continuous Infusions: . diltiazem (CARDIZEM) infusion 15 mg/hr (10/03/18 0600)  . famotidine (PEPCID) IV Stopped (10/02/18 2156)  . heparin 1,000 Units/hr (10/03/18 0500)   PRN Meds: acetaminophen **OR** acetaminophen, albuterol, capsaicin, ipratropium-albuterol, lip balm, magic mouthwash, metoprolol tartrate, metroNIDAZOLE, ondansetron **OR** ondansetron (ZOFRAN) IV, phenol, simethicone, sodium chloride flush   Vital Signs    Vitals:   10/03/18 0408 10/03/18 0500 10/03/18 0730 10/03/18 1210  BP: (!) 115/57 127/79 121/76 129/75  Pulse: 90 68 70 70  Resp: 16 19 17 18   Temp: 98.2 F (36.8 C)  97.6 F (36.4 C) 97.8 F (36.6 C)  TempSrc: Oral  Oral Oral  SpO2: 98% 96% 98% 99%  Weight: 93.9 kg     Height:        Intake/Output Summary (Last 24 hours) at 10/03/2018 1214 Last data filed at 10/03/2018 0900 Gross per 24 hour  Intake 667.48 ml  Output 1100 ml  Net -432.52 ml   Last 3 Weights 10/03/2018 10/02/2018 10/01/2018  Weight (lbs) 207 lb 0.2 oz 201 lb 11.5 oz 202 lb 9.6 oz  Weight (kg) 93.9 kg 91.5 kg 91.9 kg      Telemetry    Regular rhythm since she has mostly atrial flutter with 4: 1 AV block.  Occasionally  faster and more irregular atrial fibrillation background, rate consistently less than 100 bpm- Personally Reviewed  ECG    Coarse atrial fibrillation with controlled ventricular response and diffuse nonspecific ST segment changes- Personally Reviewed  ECGs at faster rates show intraventricular conduction delay (rate related left bundle branch block)  Physical Exam  Appears comfortable, lying with head of bed at about 30 degrees elevation GEN: No acute distress.   Neck:  5-6 cm JVD Cardiac: RRR background with occasional irregularity, no murmurs, rubs, or gallops.  Respiratory: Clear to auscultation bilaterally. GI: Soft, moderately distended, not tender, quiet bowel sounds MS: No edema; No deformity. Neuro:  Nonfocal  Psych: Normal affect   Labs    High Sensitivity Troponin:   Recent Labs  Lab 10/02/2018 1015 09/13/2018 1235 09/21/18 0407 09/21/18 0923 09/21/18 1107  TROPONINIHS 45* 48* 14 11 13       Chemistry Recent Labs  Lab 10/02/18 0428 10/02/18 1505 10/03/18 0256  NA 124* 123* 123*  K 3.9 4.1 3.8  CL 93* 92* 92*  CO2 21* 21* 21*  GLUCOSE 111* 136* 129*  BUN 11 9 9   CREATININE 0.81 0.86 0.98  CALCIUM 8.5* 8.9 9.0  GFRNONAA >60 59* 50*  GFRAA >60 >60 58*  ANIONGAP 10 10 10      Hematology Recent Labs  Lab 10/01/18 0341 10/02/18 0428 10/03/18 0256  WBC 9.7 9.3 10.4  RBC  4.32 4.09 3.96  HGB 12.9 12.3 12.0  HCT 39.2 36.3 35.9*  MCV 90.7 88.8 90.7  MCH 29.9 30.1 30.3  MCHC 32.9 33.9 33.4  RDW 12.5 12.3 12.4  PLT 329 309 316    BNPNo results for input(s): BNP, PROBNP in the last 168 hours.   DDimer No results for input(s): DDIMER in the last 168 hours.   Radiology    Dg Abd 1 View  Result Date: 10/01/2018 CLINICAL DATA:  85 year old female with a history ileus EXAM: ABDOMEN - 1 VIEW COMPARISON:  September 30, 2018, September 29, 2010 FINDINGS: Similar appearance of distended colonic loops with relative absence of stomach gas in small bowel gas. Interval  removal of the gastric tube. No rectal gas observed. IMPRESSION: Unchanged appearance of the gas pattern with colonic distention and interval removal of the gastric tube. Electronically Signed   By: Corrie Mckusick D.O.   On: 10/01/2018 15:46    Cardiac Studies   09/18/2018 echo   1. The left ventricle has normal systolic function, with an ejection fraction of 55-60%. The cavity size was normal. There is moderately increased left ventricular wall thickness. Left ventricular diastolic function could not be evaluated secondary to  atrial fibrillation.  2. The right ventricle has normal systolic function. The cavity was normal. There is no increase in right ventricular wall thickness. Right ventricular systolic pressure could not be assessed.  3. The mitral valve is grossly normal.  4. The tricuspid valve is grossly normal.  5. The aortic valve was not well visualized. Moderate calcification of the aortic valve. Aortic valve regurgitation was not assessed by color flow Doppler.  6. Very poor visualization of the AV. Cannot assess number of cusps. There is thickening and calcificaiton of the AV. By doppler there is mild to moderate AS with man AVG 56mmHg and Vmax 276cm/s.  7. The aorta is normal unless otherwise noted.  8. The inferior vena cava was dilated in size with <50% respiratory variability.  9. The interatrial septum appears to be lipomatous. 10. Recommend repeat limited echo o f the AV.  Patient Profile     85 y.o. female with moderate aortic stenosis, chronic diastolic heart failure, recent onset atrial fibrillation/atrial flutter, recurrent episodes of ileus.  Assessment & Plan    1.  Persistent atrial fibrillation: Unfortunately need to continue intravenous medications (diltiazem and heparin) until she has normalization of intestinal function.  She is well rate controlled. Chronicity is uncertain.  Last EKG documenting sinus rhythm was in 2013.  Physical exam performed yearly by  primary care provider reports regular rate and rhythm as recently as July 27, 2018 2. CHF: Her recurrent problems with symptoms of heart failure may be related to mobilization of third space fluids.  May require intermittent dosing with intravenous furosemide.  Right now she appears reasonably well hemodynamically balanced. 3. AS: This has progressed compared to previous years but is still not severe.   4. Hyponatremia: steadily worsening over the last few days but appears to have stabilized.  Urine electrolytes are being checked.     For questions or updates, please contact [place] Please consult [email or web address] for contact info under        Signed, Sanda Klein, MD  10/03/2018, 12:14 PM

## 2018-10-03 NOTE — Plan of Care (Signed)
Poc progressing.  

## 2018-10-03 NOTE — Progress Notes (Signed)
Subjective:   [name] reports she is feeling alright this morning. She was woken up by her infusion alarm and cannot fall back asleep now. She endorses wheezing that causes her to cough and have phelgm. Denies this ever being a problem before admission. In regards to her abdominal distention, she feels like it is still present but not currently bothering her. She reports urinating and even having a bowel movement. Denies any other acute complaints at this time.   Objective:  Vital signs in last 24 hours: Vitals:   10/02/18 2300 10/03/18 0000 10/03/18 0408 10/03/18 0500  BP: 112/66 105/80 (!) 115/57 127/79  Pulse: 78 94 90 68  Resp: 17 20 16 19   Temp:  (!) 97.5 F (36.4 C) 98.2 F (36.8 C)   TempSrc:  Oral Oral   SpO2: 96% 96% 98% 96%  Weight:   93.9 kg   Height:       Physical Exam Vitals signs and nursing note reviewed.  Constitutional:      General: She is not in acute distress.    Appearance: She is obese. She is not toxic-appearing.  Neck:     Vascular: No JVD.  Cardiovascular:     Rate and Rhythm: Normal rate. Rhythm irregular.     Heart sounds: Murmur present.  Pulmonary:     Effort: Pulmonary effort is normal. No accessory muscle usage or respiratory distress.     Breath sounds: Wheezing (Diffuse wheezing, most prominent in the LLL. Expiratory only. Most wheezing is upper airway.) and rales (Very minimal rales bilateral, unchanged from prior exams) present.  Abdominal:     General: Bowel sounds are decreased. There is distension.     Palpations: Abdomen is soft.     Tenderness: There is abdominal tenderness in the right upper quadrant and right lower quadrant. There is no guarding.  Skin:    General: Skin is warm and dry.  Neurological:     General: No focal deficit present.     Mental Status: She is alert and oriented to person, place, and time.  Psychiatric:        Mood and Affect: Mood normal. Mood is not anxious.        Behavior: Behavior normal. Behavior  is not agitated.    Assessment/Plan:  Principal Problem:   Atrial fibrillation with RVR (HCC) Active Problems:   Moderate aortic stenosis   Ileus (HCC)   Pleural effusion   Abdominal distention   Pneumonia   Nasogastric tube present   Respiratory difficulty  In summary, [name] is a 85 y/o female with a PMHx of HTN and ileus who was admitted for new onset A. Fib with RVR. She developed some emesis initially on admission with possible aspiration, and was treated for aspiration pneumonia. Hospital stay has also been complicated by ileus development.   #Atrial fibrillation with atrial flutter CHADsVAC 5-6 with HASBLED of 1. Echocardiogramon 8/14with normal LVEF, no LAE/RAE, but moderate AS.   - Cardiologyon board and we appreciate the recommendations - Replace K to maintain >4.0 and Mg >2.0 - Continue with Diltiazem drip and Heparin until patient can tolerate PO again. - Metoprolol PRN for HR >120   #Ileus GI has recommended continuing MiraLAX twice daily and prune juice. They have signed off on 08/27. Patient continues to have distention and discomfort despite bowel movements. She is tolerating small amounts of liquid.   - Maintain K >4.0 - PRN zofran for nausea  - IV protonix 40 mg QD  -  Miralax BID - Prune juice daily, twice daily when tolerated  #Shortness of breath  Patient has had bilateral rales throughout admission that have been unchanged. No signs of volume overload at this time. In addition, she has had wheezing that is mostly upper airway but is mildly diffuse as well. Most prominent in the left side this morning. She denies breathing problems prior to admission.   - Proventil and Duoneb PRNfor wheezing. - Lasix 40 mg daily p.o.  # Hyponatremia:  Sodium continues to trend down during admission with a low of 123 yesterday and this morning. Gentle hydration was provided, however sodium continued to trend down. Possibly SIADH. Checking urine studies now  that its been 18 hours since her Lasix was given.   - BMPs daily - Urine sodium and osmolality.    #Hypertension: Stable #Aspiration pneumonia:Resolved  Dispo: Anticipated dischargepending medical improvement.   Dr. Jose Persia Internal Medicine PGY-1  Pager: [phone] 10/03/2018, 6:51 AM

## 2018-10-03 NOTE — Progress Notes (Signed)
ANTICOAGULATION CONSULT NOTE  Pharmacy Consult:  Heparin Indication: atrial fibrillation  Allergies  Allergen Reactions  . Diclofenac Sodium Anaphylaxis  . Ibuprofen Anaphylaxis  . Lisinopril     Fall hazard, dizzy  . Adhesive [Tape]   . Codeine Nausea Only  . Tizanidine     "knocked out cold"    Patient Measurements: Height: 5\' 6"  (167.6 cm) Weight: 207 lb 0.2 oz (93.9 kg) IBW/kg (Calculated) : 59.3 Heparin Dosing Weight: 80 kg  Vital Signs: Temp: 97.6 F (36.4 C) (08/29 0730) Temp Source: Oral (08/29 0730) BP: 121/76 (08/29 0730) Pulse Rate: 70 (08/29 0730)  Labs: Recent Labs    10/01/18 0341 10/01/18 1706 10/02/18 0428 10/02/18 1505 10/03/18 0256  HGB 12.9  --  12.3  --  12.0  HCT 39.2  --  36.3  --  35.9*  PLT 329  --  309  --  316  HEPARINUNFRC 0.79* 0.72* 0.38  --  0.38  CREATININE 0.92  --  0.81 0.86 0.98    Estimated Creatinine Clearance: 43.1 mL/min (by C-G formula based on SCr of 0.98 mg/dL).  Assessment: 89 YOF with new atrial fibrillation this admit. Received IV heparin 8/14>>8/18, when changed to Eliquis 5 mg PO BID.  Meds back to IV heparin on 09/24/18 due to ileus, unable to take po.    Heparin level remains within goal range at 0.38. No bleeding noted. CBC stable. Cardiologist recommended to transition to [place] when patient able to take po.  Goal of Therapy:  Heparin level 0.3-0.7 units/mL Monitor platelets by anticoagulation protocol: Yes   Plan:  Continue IV heparin at 1000 units/hr Daily heparin level and CBC F/u ability to take po, and begin Eliquis.  Richardine Service, PharmD PGY1 Pharmacy Resident Phone: [phone] 10/03/2018  10:41 AM  Please check [email or web address] for unit-specific pharmacy phone numbers.

## 2018-10-03 NOTE — Progress Notes (Signed)
Placed pt on Bipap per MD due to BBS CRS/Wheezes on RA.Marland Kitchen Pt agreeing to wear 2 hours. RT will continue to monitor

## 2018-10-04 ENCOUNTER — Inpatient Hospital Stay (HOSPITAL_COMMUNITY): Payer: Medicare Other

## 2018-10-04 DIAGNOSIS — R0602 Shortness of breath: Secondary | ICD-10-CM

## 2018-10-04 DIAGNOSIS — I4892 Unspecified atrial flutter: Secondary | ICD-10-CM

## 2018-10-04 LAB — BASIC METABOLIC PANEL
Anion gap: 11 (ref 5–15)
Anion gap: 11 (ref 5–15)
BUN: 7 mg/dL — ABNORMAL LOW (ref 8–23)
BUN: 7 mg/dL — ABNORMAL LOW (ref 8–23)
CO2: 23 mmol/L (ref 22–32)
CO2: 21 mmol/L — ABNORMAL LOW (ref 22–32)
Calcium: 8.9 mg/dL (ref 8.9–10.3)
Calcium: 8.8 mg/dL — ABNORMAL LOW (ref 8.9–10.3)
Chloride: 88 mmol/L — ABNORMAL LOW (ref 98–111)
Chloride: 86 mmol/L — ABNORMAL LOW (ref 98–111)
Creatinine, Ser: 0.82 mg/dL (ref 0.44–1.00)
Creatinine, Ser: 0.87 mg/dL (ref 0.44–1.00)
GFR calc Af Amer: >60 mL/min (ref >60)
GFR calc Af Amer: >60 mL/min (ref >60)
GFR calc non Af Amer: >60 mL/min (ref >60)
GFR calc non Af Amer: 58 mL/min — ABNORMAL LOW (ref >60)
Glucose, Bld: 115 mg/dL — ABNORMAL HIGH (ref 70–99)
Glucose, Bld: 126 mg/dL — ABNORMAL HIGH (ref 70–99)
Potassium: 3.2 mmol/L — ABNORMAL LOW (ref 3.5–5.1)
Potassium: 4.2 mmol/L (ref 3.5–5.1)
Sodium: 122 mmol/L — ABNORMAL LOW (ref 135–145)
Sodium: 118 mmol/L — CL (ref 135–145)

## 2018-10-04 LAB — CBC
HCT: 37.6 % (ref 36.0–46.0)
Hemoglobin: 12.8 g/dL (ref 12.0–15.0)
MCH: 30 pg (ref 26.0–34.0)
MCHC: 34 g/dL (ref 30.0–36.0)
MCV: 88.3 fL (ref 80.0–100.0)
Platelets: 312 10*3/uL (ref 150–400)
RBC: 4.26 MIL/uL (ref 3.87–5.11)
RDW: 12.3 % (ref 11.5–15.5)
WBC: 10.3 10*3/uL (ref 4.0–10.5)
nRBC: 0 % (ref 0.0–0.2)

## 2018-10-04 LAB — HEPARIN LEVEL (UNFRACTIONATED)
Heparin Unfractionated: 0.19 IU/mL — ABNORMAL LOW (ref 0.30–0.70)
Heparin Unfractionated: 0.73 IU/mL — ABNORMAL HIGH (ref 0.30–0.70)

## 2018-10-04 LAB — MAGNESIUM: Magnesium: 1.5 mg/dL — ABNORMAL LOW (ref 1.7–2.4)

## 2018-10-04 MED ORDER — ACETAMINOPHEN 10 MG/ML IV SOLN
1000.0000 mg | Freq: Once | INTRAVENOUS | Status: AC
  Administered 2018-10-04: 1000 mg via INTRAVENOUS
  Filled 2018-10-04: qty 100

## 2018-10-04 MED ORDER — SODIUM CHLORIDE 0.9 % IV SOLN
INTRAVENOUS | Status: AC
  Administered 2018-10-04: 22:00:00 via INTRAVENOUS

## 2018-10-04 MED ORDER — HEPARIN BOLUS VIA INFUSION
2400.0000 [IU] | Freq: Once | INTRAVENOUS | Status: AC
  Administered 2018-10-04: 2400 [IU] via INTRAVENOUS
  Filled 2018-10-04: qty 2400

## 2018-10-04 MED ORDER — MAGNESIUM SULFATE 4 GM/100ML IV SOLN
4.0000 g | Freq: Once | INTRAVENOUS | Status: AC
  Administered 2018-10-04: 09:00:00 4 g via INTRAVENOUS
  Filled 2018-10-04: qty 100

## 2018-10-04 MED ORDER — SODIUM CHLORIDE 0.9 % IV BOLUS
500.0000 mL | Freq: Once | INTRAVENOUS | Status: AC
  Administered 2018-10-04: 500 mL via INTRAVENOUS

## 2018-10-04 MED ORDER — METOCLOPRAMIDE HCL 5 MG/ML IJ SOLN
10.0000 mg | Freq: Four times a day (QID) | INTRAMUSCULAR | Status: DC
  Administered 2018-10-04 – 2018-10-12 (×32): 10 mg via INTRAVENOUS
  Filled 2018-10-04 (×32): qty 2

## 2018-10-04 MED ORDER — POTASSIUM CHLORIDE 10 MEQ/100ML IV SOLN
10.0000 meq | INTRAVENOUS | Status: AC
  Administered 2018-10-04 (×5): 10 meq via INTRAVENOUS
  Filled 2018-10-04 (×6): qty 100

## 2018-10-04 MED ORDER — KETOROLAC TROMETHAMINE 30 MG/ML IJ SOLN: 15.0000 mg | Freq: Once | INTRAMUSCULAR | Status: DC

## 2018-10-04 MED ORDER — POTASSIUM CHLORIDE 10 MEQ/100ML IV SOLN
10.0000 meq | Freq: Once | INTRAVENOUS | Status: AC
  Administered 2018-10-04: 17:00:00 10 meq via INTRAVENOUS

## 2018-10-04 MED ORDER — LORAZEPAM 2 MG/ML IJ SOLN
0.5000 mg | Freq: Once | INTRAMUSCULAR | Status: AC | PRN
  Administered 2018-10-04: 0.5 mg via INTRAVENOUS
  Filled 2018-10-04: qty 1

## 2018-10-04 NOTE — Progress Notes (Signed)
CRITICAL VALUE ALERT  Critical Value:  Sodium 118, osmolality 247  Date & Time Notied:  10/04/18, 2334  Provider Notified: DR. Darrick Meigs Rylee.   Orders Received/Actions taken: MD called back, orders in [facility] for bolus and BMP.

## 2018-10-04 NOTE — Progress Notes (Signed)
Attending MD paged regarding increased respiratory distress, severe abdominale pain, and anxiety post GI MD assessment.  Respiratory therapy was also paged to administer breathing treatment.  IV acetaminophen, Reglan, and Ativan ordered.  Additional orders for In/Out cath and  Abdominal X-ray placed.

## 2018-10-04 NOTE — Progress Notes (Signed)
    Progress Note   Subjective  Asked to re-evaluate today due to increased abdominal distention and pain. Pts son at beside   Objective  Vital signs in last 24 hours: Temp:  [97.5 F (36.4 C)-97.8 F (36.6 C)] 97.5 F (36.4 C) (08/30 0621) Pulse Rate:  [69-155] 155 (08/30 0937) Resp:  [14-17] 14 (08/30 0937) BP: (138-146)/(58-79) 146/58 (08/30 0937) SpO2:  [99 %-100 %] 100 % (08/30 0937) Weight:  [98.4 kg] 98.4 kg (08/30 0621) Last BM Date: 10/03/18  General: Alert, elderly, well-developed, uncomfortable Heart:  Regular rate and rhythm; no murmurs Chest: End expiratory wheezes  Abdomen:  Soft, mild generalized tender and distended. Normal bowel sounds, without guarding, and without rebound.   Extremities:  Without edema. Neurologic:  Alert and  oriented x4; grossly normal neurologically. Psych:  Alert and cooperative. Normal mood and affect.  Intake/Output from previous day: 08/29 0701 - 08/30 0700 In: 414.2 [P.O.:50; I.V.:314.2; IV Piggyback:50] Out: 450 [Urine:450] Intake/Output this shift: No intake/output data recorded.  Lab Results: Recent Labs    10/02/18 0428 10/03/18 0256 10/04/18 0652  WBC 9.3 10.4 10.3  HGB 12.3 12.0 12.8  HCT 36.3 35.9* 37.6  PLT 309 316 312   BMET Recent Labs    10/02/18 1505 10/03/18 0256 10/04/18 0652  NA 123* 123* 122*  K 4.1 3.8 3.2*  CL 92* 92* 88*  CO2 21* 21* 23  GLUCOSE 136* 129* 115*  BUN 9 9 7*  CREATININE 0.86 0.98 0.82  CALCIUM 8.9 9.0 8.9    Studies/Results: Dg Abd 1 View  Result Date: 10/04/2018 CLINICAL DATA:  Abdominal distention. EXAM: ABDOMEN - 1 VIEW COMPARISON:  Abdominal x-ray dated October 01, 2018. FINDINGS: Mildly worsened gaseous distention of the colon, most prominently involving the cecum. The cecum measures up to 18.4 cm in maximal diameter, previously 15.3 cm. Scattered air within nondilated small bowel. IMPRESSION: 1. Mildly worsened colonic distention most prominently involving the cecum.  Electronically Signed   By: Titus Dubin M.D.   On: 10/04/2018 12:22     Assessment & Recommendations   1. Colonic ileus, chronic colon distention, chronic constipation have worsened in past couple days. K and Mg are both low today. Her colon is chronically dilated so symptoms and abdominal exams are probably more helpful than colon measurements.  Resume rectal tube IV Reglan Sips of clear liquids Continue Miralax bid [place] to maintain K > 4 and Mg > 2 If not improving the NPO and NGT to LIS     LOS: 17 days   Luisdavid Hamblin T. Fuller Plan MD 10/04/2018, 3:53 PM

## 2018-10-04 NOTE — Progress Notes (Signed)
Paged by RN for respiratory distress. Patient was seen by GI and started developing abdominal pain and respiratory distress after examination. She was placed on BiPAP but continues to be tachypnic and complaining of generalized abdominal pain. On exam, abdomen is distended with generalized tenderness. Normoactive bowel sounds present. Minimal expiratory wheezing present on lung auscultation. Patient appears to be in discomfort. Of note, she has not voided today.  Plan: - Urine cath  - X-ray abdomen - Ativan for anxiety  - IV acetaminophen

## 2018-10-04 NOTE — Progress Notes (Signed)
Pt's son Saralyn Pilar updated last night and this am and answered all the  questions.

## 2018-10-04 NOTE — Progress Notes (Signed)
Subjective:   [name] is doing okay this AM. She is drinking some fluids. She had a good night of rest. Continues to have some discomfort with her breathing that she feels the DuoNebs help with. Continues to have wheezing though. She is having some issues with her bowels but is unable to tell us why. She is hopeful that she will get better.   We discussed the plan to touch base with GI again today and continue the course. She voices understanding. All questions and concerns addressed.   Objective:  Vital signs in last 24 hours: Vitals:   10/03/18 1210 10/03/18 2003 10/04/18 0011 10/04/18 0621  BP: 129/75 140/68 (!) 141/79 138/73  Pulse: 70 72 73 69  Resp: 18 17 15 16   Temp: 97.8 F (36.6 C) 97.8 F (36.6 C) 97.6 F (36.4 C) (!) 97.5 F (36.4 C)  TempSrc: Oral Oral Oral Oral  SpO2: 99% 100% 100% 99%  Weight:    98.4 kg  Height:       Physical Exam Vitals signs and nursing note reviewed.  Constitutional:      General: She is not in acute distress.    Appearance: She is obese. She is not toxic-appearing.  Neck:     Vascular: No JVD.  Cardiovascular:     Rate and Rhythm: Normal rate. Rhythm irregular.     Heart sounds: Murmur present.  Pulmonary:     Effort: Pulmonary effort is normal. No accessory muscle usage or respiratory distress.     Breath sounds: Wheezing (Diffuse and expiratory. Improved since receiving Duoneb) present. No rales.  Abdominal:     General: Bowel sounds are decreased. There is distension (Appears more so than yesterday).     Palpations: Abdomen is soft.     Tenderness: There is generalized abdominal tenderness. There is no guarding.     Comments: Bowel sounds are more decreased today than prior exam  Skin:    General: Skin is warm and dry.  Neurological:     General: No focal deficit present.     Mental Status: She is alert and oriented to person, place, and time.  Psychiatric:        Mood and Affect: Mood normal. Mood is not anxious.      Behavior: Behavior normal. Behavior is not agitated.    Assessment/Plan:  Principal Problem:   Atrial fibrillation with RVR (HCC) Active Problems:   Moderate aortic stenosis   Ileus (HCC)   Pleural effusion   Abdominal distention   Pneumonia   Nasogastric tube present   Respiratory difficulty  In summary, [name] is a 85 y/o female with a PMHx of HTN and ileus who was admitted for new onset A. Fib with RVR. She developed some emesis initially on admission with possible aspiration, and was treated for aspiration pneumonia. Hospital stay has also been complicated by ileus development.   #Atrial fibrillation with atrial flutter CHADsVAC 5-6 with HASBLED of 1. Echocardiogramon 8/14with normal LVEF, no LAE/RAE, but moderate AS. Heart rate has been very well controlled and maintains in the 70s to 80s.  Since she continues to have questionable p.o. tolerance, continue with current course of IV rate control.  - Cardiologyon board and we appreciate the recommendations - Replace K to maintain >4.0 and Mg >2.0 - Continue with Diltiazem drip and Heparin until patient can tolerate PO again. - Metoprolol PRN for HR >120   #Ileus GI has recommended continuing MiraLAX twice daily and prune juice. They have  signed off on 08/27.  Given patient's continued distention and decreasing in bowel sounds, spoke with the GI PA, Amy Esterwood, regarding additional options. She recommended we first obtain a KUB to assess any changes in ileus and then we could try erythromycin, unless worsening, which could require decompression.  KUB shows a mild worsening of ileus and the cecum now distended approximately 18 cm, when priorly it was distended 15 cm. I have reached back out to Amy regarding possible need for decompression. Although some aspect of distention may be chronic, I am still concerned for possible bowel ischemia with the continued enlargement.   - Maintain K >4.0 - PRN zofran for nausea  - IV  protonix 40 mg QD  - Miralax BID - Prune juice daily, twice daily when tolerated - Follow up GI recommendations  #Shortness of breath  Patient has had bilateral rales throughout admission that have been unchanged. Wheezing today has improved after receiving Duoneb, more so than it has from Levalbuterol or Proventil.   - Proventil and Duoneb PRNfor wheezing. - Lasix 40 mg daily p.o.  # Hyponatremia:  Sodium continues to trend down during admission with a low of 122 this morning. Gentle hydration was provided on 08/28, however sodium continued to trend down. Possibly SIADH. Per cardiology, could also be due to third spacing. Will continue to monitor closely. Plan to retry for urine specimen tomorrow morning.   - BMPs daily - Urine sodium and osmolality tomorrow.  #Hypertension: Stable #Aspiration pneumonia:Resolved   Dispo: Anticipated dischargepending medical improvement.   Dr. Jose Persia Internal Medicine PGY-1  Pager: [phone] 10/04/2018, 6:48 AM

## 2018-10-04 NOTE — Progress Notes (Signed)
ANTICOAGULATION CONSULT NOTE  Pharmacy Consult:  Heparin Indication: atrial fibrillation  Allergies  Allergen Reactions  . Diclofenac Sodium Anaphylaxis  . Ibuprofen Anaphylaxis  . Lisinopril     Fall hazard, dizzy  . Adhesive [Tape]   . Codeine Nausea Only  . Tizanidine     "knocked out cold"    Patient Measurements: Height: 5\' 6"  (167.6 cm) Weight: 216 lb 14.9 oz (98.4 kg) IBW/kg (Calculated) : 59.3 Heparin Dosing Weight: 80 kg  Vital Signs: Temp: 97.5 F (36.4 C) (08/30 0621) Temp Source: Oral (08/30 0621) BP: 146/58 (08/30 0937) Pulse Rate: 95 (08/30 1627)  Labs: Recent Labs    10/02/18 0428 10/02/18 1505 10/03/18 0256 10/04/18 0652 10/04/18 1545  HGB 12.3  --  12.0 12.8  --   HCT 36.3  --  35.9* 37.6  --   PLT 309  --  316 312  --   HEPARINUNFRC 0.38  --  0.38 0.19* 0.73*  CREATININE 0.81 0.86 0.98 0.82  --     Estimated Creatinine Clearance: 52.8 mL/min (by C-G formula based on SCr of 0.82 mg/dL).  Assessment: 67 YOF with new atrial fibrillation this admit. Received IV heparin 8/14>>8/18, when changed to Eliquis 5 mg PO BID.  Meds back to IV heparin on 09/24/18 due to ileus, unable to take po.    Heparin level 0.73  Goal of Therapy:  Heparin level 0.3-0.7 units/mL Monitor platelets by anticoagulation protocol: Yes   Plan:  Decrease heparin to 1150 units / hr Daily heparin level and CBC F/u ability to take po, and begin Eliquis.  Thank you Anette Guarneri, PharmD 10/04/2018  4:32 PM  Please check [email or web address] for unit-specific pharmacy phone numbers.

## 2018-10-04 NOTE — Progress Notes (Signed)
ANTICOAGULATION CONSULT NOTE  Pharmacy Consult:  Heparin Indication: atrial fibrillation  Allergies  Allergen Reactions  . Diclofenac Sodium Anaphylaxis  . Ibuprofen Anaphylaxis  . Lisinopril     Fall hazard, dizzy  . Adhesive [Tape]   . Codeine Nausea Only  . Tizanidine     "knocked out cold"    Patient Measurements: Height: 5\' 6"  (167.6 cm) Weight: 216 lb 14.9 oz (98.4 kg) IBW/kg (Calculated) : 59.3 Heparin Dosing Weight: 80 kg  Vital Signs: Temp: 97.5 F (36.4 C) (08/30 0621) Temp Source: Oral (08/30 0621) BP: 138/73 (08/30 0621) Pulse Rate: 69 (08/30 0621)  Labs: Recent Labs    10/02/18 0428 10/02/18 1505 10/03/18 0256 10/04/18 0652  HGB 12.3  --  12.0 12.8  HCT 36.3  --  35.9* 37.6  PLT 309  --  316 312  HEPARINUNFRC 0.38  --  0.38 0.19*  CREATININE 0.81 0.86 0.98  --     Estimated Creatinine Clearance: 44.2 mL/min (by C-G formula based on SCr of 0.98 mg/dL).  Assessment: 59 YOF with new atrial fibrillation this admit. Received IV heparin 8/14>>8/18, when changed to Eliquis 5 mg PO BID.  Meds back to IV heparin on 09/24/18 due to ileus, unable to take po.    Heparin level subtherapeutic at 0.19 after being therapeutic for 2 days. Confirmed that heparin is still running with no bleeding or infusion issues per RN. CBC stable. Cardiologist recommended to transition to [place] when patient able to take po.  Goal of Therapy:  Heparin level 0.3-0.7 units/mL Monitor platelets by anticoagulation protocol: Yes   Plan:  Re-bolus IV heparin 2400 units Increase IV heparin to 1250 units/hr Daily heparin level and CBC F/u ability to take po, and begin Eliquis.  Richardine Service, PharmD PGY1 Pharmacy Resident Phone: [phone] 10/04/2018  7:36 AM  Please check [email or web address] for unit-specific pharmacy phone numbers.

## 2018-10-04 NOTE — Progress Notes (Signed)
  Date: 10/04/2018  Patient name: [name]  Medical record number: [id number]  Date of birth: 09/07/1924        I have seen and evaluated this patient and I have discussed the plan of care with the house staff. Please see their note for complete details. I concur with their findings with the following additions/corrections: Ms Ketterling was seen this morning on team rounds.  She had audible wheezing without the stethoscope along with a faint expiratory wheezing without stethoscope.  She could have some upper airway dysfunction and rather than an extensive work-up for the etiology, for expediency we will obtain a speech consult to see if they can do some therapy to help with her symptoms of air hunger/dyspnea.  Abdomen is still distended with decreased bowel sounds.  Bartholomew Crews, MD 10/04/2018, 2:18 PM

## 2018-10-04 NOTE — Progress Notes (Signed)
Progress Note  Patient Name: [name] Date of Encounter: 10/04/2018  Primary Cardiologist: Shelva Majestic, MD   Subjective   Abdomen a little more distended, appetite poor. Remains in AFib with excellent rate control  Inpatient Medications    Scheduled Meds: . feeding supplement  1 Container Oral TID BM  . furosemide  40 mg Oral Daily  . multivitamin with minerals  1 tablet Oral Daily  . polyethylene glycol  17 g Oral BID  . sodium chloride flush  3 mL Intravenous Q12H   Continuous Infusions: . diltiazem (CARDIZEM) infusion 15 mg/hr (10/04/18 0246)  . famotidine (PEPCID) IV 20 mg (10/03/18 2119)  . heparin 1,250 Units/hr (10/04/18 0825)  . potassium chloride 10 mEq (10/04/18 1017)   PRN Meds: acetaminophen **OR** acetaminophen, albuterol, capsaicin, ipratropium-albuterol, lip balm, magic mouthwash, metoprolol tartrate, metroNIDAZOLE, ondansetron **OR** ondansetron (ZOFRAN) IV, phenol, simethicone, sodium chloride flush   Vital Signs    Vitals:   10/03/18 2003 10/04/18 0011 10/04/18 0621 10/04/18 0937  BP: 140/68 (!) 141/79 138/73 (!) 146/58  Pulse: 72 73 69 (!) 155  Resp: 17 15 16 14   Temp: 97.8 F (36.6 C) 97.6 F (36.4 C) (!) 97.5 F (36.4 C)   TempSrc: Oral Oral Oral   SpO2: 100% 100% 99% 100%  Weight:   98.4 kg   Height:        Intake/Output Summary (Last 24 hours) at 10/04/2018 1133 Last data filed at 10/04/2018 0300 Gross per 24 hour  Intake 414.22 ml  Output -  Net 414.22 ml   Last 3 Weights 10/04/2018 10/03/2018 10/02/2018  Weight (lbs) 216 lb 14.9 oz 207 lb 0.2 oz 201 lb 11.5 oz  Weight (kg) 98.4 kg 93.9 kg 91.5 kg      Telemetry    AFib (occ looks like typical Aflutter) with good rate control - Personally Reviewed  ECG    No new tracing - Personally Reviewed  Physical Exam  Breathing comfortably GEN: No acute distress.   Neck: No JVD Cardiac: irregular, early peaking Ao ejection murmur no diastolic murmurs, rubs, or gallops.   Respiratory: Clear to auscultation bilaterally. GI: distended , reduced bowel sounds MS: No edema; No deformity. Neuro:  Nonfocal  Psych: Normal affect   Labs    High Sensitivity Troponin:   Recent Labs  Lab 09/30/2018 1015 09/25/2018 1235 09/21/18 0407 09/21/18 0923 09/21/18 1107  TROPONINIHS 45* 48* 14 11 13       Chemistry Recent Labs  Lab 10/02/18 1505 10/03/18 0256 10/04/18 0652  NA 123* 123* 122*  K 4.1 3.8 3.2*  CL 92* 92* 88*  CO2 21* 21* 23  GLUCOSE 136* 129* 115*  BUN 9 9 7*  CREATININE 0.86 0.98 0.82  CALCIUM 8.9 9.0 8.9  GFRNONAA 59* 50* >60  GFRAA >60 58* >60  ANIONGAP 10 10 11      Hematology Recent Labs  Lab 10/02/18 0428 10/03/18 0256 10/04/18 0652  WBC 9.3 10.4 10.3  RBC 4.09 3.96 4.26  HGB 12.3 12.0 12.8  HCT 36.3 35.9* 37.6  MCV 88.8 90.7 88.3  MCH 30.1 30.3 30.0  MCHC 33.9 33.4 34.0  RDW 12.3 12.4 12.3  PLT 309 316 312    BNPNo results for input(s): BNP, PROBNP in the last 168 hours.   DDimer No results for input(s): DDIMER in the last 168 hours.   Radiology    No results found.  Cardiac Studies   09/18/2018 echo  1. The left ventricle has normal systolic function,  with an ejection fraction of 55-60%. The cavity size was normal. There is moderately increased left ventricular wall thickness. Left ventricular diastolic function could not be evaluated secondary to  atrial fibrillation. 2. The right ventricle has normal systolic function. The cavity was normal. There is no increase in right ventricular wall thickness. Right ventricular systolic pressure could not be assessed. 3. The mitral valve is grossly normal. 4. The tricuspid valve is grossly normal. 5. The aortic valve was not well visualized. Moderate calcification of the aortic valve. Aortic valve regurgitation was not assessed by color flow Doppler. 6. Very poor visualization of the AV. Cannot assess number of cusps. There is thickening and calcificaiton of the AV. By  doppler there is mild to moderate AS with man AVG 47mmHg and Vmax 276cm/s. 7. The aorta is normal unless otherwise noted. 8. The inferior vena cava was dilated in size with <50% respiratory variability. 9. The interatrial septum appears to be lipomatous. 10. Recommend repeat limited echo o f the AV.  Patient Profile     85 y.o. female with moderate aortic stenosis, chronic diastolic heart failure, recent onset atrial fibrillation/atrial flutter, recurrent episodes of ileus.  Assessment & Plan    1.  Persistent atrial fibrillation: Well rate controlled and asymptomatic. Transition to PO diltiazem and DOAC, once has normalization of intestinal function.  She is well rate controlled. Chronicity is uncertain.  Last EKG documenting sinus rhythm was in 2013.  Physical exam performed yearly by primary care provider reports regular rate and rhythm as recently as July 27, 2018 2. CHF: Her recurrent problems with symptoms of heart failure may be related to mobilization of third space fluids.  May require intermittent dosing with intravenous furosemide.  Right now she appears reasonably well hemodynamically balanced. 3. AS: This has progressed compared to previous years but is still not severe.   4. Hyponatremia: steadily worsening over the last few days, likely due to GI third spacing and SIADH  For questions or updates, please contact [place] Please consult [email or web address] for contact info under        Signed, Sanda Klein, MD  10/04/2018, 11:33 AM

## 2018-10-05 LAB — OSMOLALITY, URINE: Osmolality, Ur: 290 mOsm/kg — ABNORMAL LOW (ref 300–900)

## 2018-10-05 LAB — BASIC METABOLIC PANEL
Anion gap: 12 (ref 5–15)
Anion gap: 10 (ref 5–15)
Anion gap: 10 (ref 5–15)
Anion gap: 12 (ref 5–15)
Anion gap: 11 (ref 5–15)
BUN: 7 mg/dL — ABNORMAL LOW (ref 8–23)
BUN: 7 mg/dL — ABNORMAL LOW (ref 8–23)
BUN: 6 mg/dL — ABNORMAL LOW (ref 8–23)
BUN: <5 mg/dL — ABNORMAL LOW (ref 8–23)
BUN: 5 mg/dL — ABNORMAL LOW (ref 8–23)
CO2: 21 mmol/L — ABNORMAL LOW (ref 22–32)
CO2: 22 mmol/L (ref 22–32)
CO2: 22 mmol/L (ref 22–32)
CO2: 22 mmol/L (ref 22–32)
CO2: 21 mmol/L — ABNORMAL LOW (ref 22–32)
Calcium: 8.5 mg/dL — ABNORMAL LOW (ref 8.9–10.3)
Calcium: 8.4 mg/dL — ABNORMAL LOW (ref 8.9–10.3)
Calcium: 8.2 mg/dL — ABNORMAL LOW (ref 8.9–10.3)
Calcium: 8 mg/dL — ABNORMAL LOW (ref 8.9–10.3)
Calcium: 8.1 mg/dL — ABNORMAL LOW (ref 8.9–10.3)
Chloride: 86 mmol/L — ABNORMAL LOW (ref 98–111)
Chloride: 88 mmol/L — ABNORMAL LOW (ref 98–111)
Chloride: 90 mmol/L — ABNORMAL LOW (ref 98–111)
Chloride: 85 mmol/L — ABNORMAL LOW (ref 98–111)
Chloride: 89 mmol/L — ABNORMAL LOW (ref 98–111)
Creatinine, Ser: 0.9 mg/dL (ref 0.44–1.00)
Creatinine, Ser: 0.86 mg/dL (ref 0.44–1.00)
Creatinine, Ser: 0.87 mg/dL (ref 0.44–1.00)
Creatinine, Ser: 0.91 mg/dL (ref 0.44–1.00)
Creatinine, Ser: 0.76 mg/dL (ref 0.44–1.00)
GFR calc Af Amer: >60 mL/min (ref >60)
GFR calc Af Amer: >60 mL/min (ref >60)
GFR calc Af Amer: >60 mL/min (ref >60)
GFR calc Af Amer: >60 mL/min (ref >60)
GFR calc Af Amer: >60 mL/min (ref >60)
GFR calc non Af Amer: 56 mL/min — ABNORMAL LOW (ref >60)
GFR calc non Af Amer: 59 mL/min — ABNORMAL LOW (ref >60)
GFR calc non Af Amer: 58 mL/min — ABNORMAL LOW (ref >60)
GFR calc non Af Amer: 55 mL/min — ABNORMAL LOW (ref >60)
GFR calc non Af Amer: >60 mL/min (ref >60)
Glucose, Bld: 120 mg/dL — ABNORMAL HIGH (ref 70–99)
Glucose, Bld: 121 mg/dL — ABNORMAL HIGH (ref 70–99)
Glucose, Bld: 121 mg/dL — ABNORMAL HIGH (ref 70–99)
Glucose, Bld: 127 mg/dL — ABNORMAL HIGH (ref 70–99)
Glucose, Bld: 104 mg/dL — ABNORMAL HIGH (ref 70–99)
Potassium: 3.7 mmol/L (ref 3.5–5.1)
Potassium: 3.7 mmol/L (ref 3.5–5.1)
Potassium: 3.5 mmol/L (ref 3.5–5.1)
Potassium: 3.6 mmol/L (ref 3.5–5.1)
Potassium: 3.8 mmol/L (ref 3.5–5.1)
Sodium: 119 mmol/L — CL (ref 135–145)
Sodium: 120 mmol/L — ABNORMAL LOW (ref 135–145)
Sodium: 122 mmol/L — ABNORMAL LOW (ref 135–145)
Sodium: 119 mmol/L — CL (ref 135–145)
Sodium: 121 mmol/L — ABNORMAL LOW (ref 135–145)

## 2018-10-05 LAB — OSMOLALITY: Osmolality: 247 mOsm/kg — CL (ref 275–295)

## 2018-10-05 LAB — MAGNESIUM: Magnesium: 1.9 mg/dL (ref 1.7–2.4)

## 2018-10-05 LAB — SODIUM, URINE, RANDOM: Sodium, Ur: 82 mmol/L

## 2018-10-05 LAB — CBC
HCT: 36.8 % (ref 36.0–46.0)
Hemoglobin: 12.1 g/dL (ref 12.0–15.0)
MCH: 29.9 pg (ref 26.0–34.0)
MCHC: 32.9 g/dL (ref 30.0–36.0)
MCV: 90.9 fL (ref 80.0–100.0)
Platelets: 338 10*3/uL (ref 150–400)
RBC: 4.05 MIL/uL (ref 3.87–5.11)
RDW: 12.3 % (ref 11.5–15.5)
WBC: 11 10*3/uL — ABNORMAL HIGH (ref 4.0–10.5)
nRBC: 0 % (ref 0.0–0.2)

## 2018-10-05 LAB — HEPARIN LEVEL (UNFRACTIONATED): Heparin Unfractionated: 0.59 IU/mL (ref 0.30–0.70)

## 2018-10-05 MED ORDER — POTASSIUM CHLORIDE CRYS ER 20 MEQ PO TBCR
40.0000 meq | EXTENDED_RELEASE_TABLET | Freq: Once | ORAL | Status: AC
  Administered 2018-10-05: 40 meq via ORAL
  Filled 2018-10-05: qty 2

## 2018-10-05 MED ORDER — MAGNESIUM SULFATE 2 GM/50ML IV SOLN
2.0000 g | Freq: Once | INTRAVENOUS | Status: AC
  Administered 2018-10-05: 2 g via INTRAVENOUS
  Filled 2018-10-05: qty 50

## 2018-10-05 MED ORDER — SODIUM CHLORIDE 0.9 % IV SOLN
INTRAVENOUS | Status: AC
  Administered 2018-10-05 – 2018-10-10 (×7): via INTRAVENOUS

## 2018-10-05 MED ORDER — POTASSIUM CHLORIDE 20 MEQ PO PACK: 40.0000 meq | PACK | Freq: Once | ORAL | Status: DC

## 2018-10-05 MED ORDER — POTASSIUM CHLORIDE CRYS ER 20 MEQ PO TBCR
40.0000 meq | EXTENDED_RELEASE_TABLET | Freq: Once | ORAL | Status: AC
  Administered 2018-10-05: 09:00:00 40 meq via ORAL
  Filled 2018-10-05: qty 2

## 2018-10-05 NOTE — Progress Notes (Addendum)
Progress Note   Subjective  Ongoing abdominal distention and pain. Ate a few bites of breakfast without nausea or vomiting. Feels short of breath. No family present at the time of my evaluation.  Liquid stool and air present in the rectal tube bag.    Objective  Vital signs in last 24 hours: Temp:  [97.3 F (36.3 C)-97.9 F (36.6 C)] 97.9 F (36.6 C) (08/31 0347) Pulse Rate:  [69-95] 74 (08/31 0744) Resp:  [13-25] 14 (08/31 0535) BP: (118-148)/(52-72) 139/58 (08/31 0744) SpO2:  [98 %-100 %] 99 % (08/31 0744) FiO2 (%):  [35 %] 35 % (08/30 1627) Weight:  [93.9 kg] 93.9 kg (08/31 0535) Last BM Date: 10/05/18  General: Alert, elderly, well-developed, uncomfortable Abdomen:  Soft, protuberent. Distended.  Mild generalized tenderness. + tympany. Near absent bowel sounds. No rebound or guarding.   Extremities:  Without edema. Neurologic:  Alert and  oriented x4; grossly normal neurologically. Psych:  Alert and cooperative. Normal mood and affect.  Intake/Output from previous day: 08/30 0701 - 08/31 0700 In: 1406.9 [I.V.:1056.2; IV Piggyback:350.7] Out: 1180 [Urine:1170; Stool:10] Intake/Output this shift: Total I/O In: -  Out: 500 [Urine:500]  Lab Results: Recent Labs    10/03/18 0256 10/04/18 0652 10/05/18 0315  WBC 10.4 10.3 11.0*  HGB 12.0 12.8 12.1  HCT 35.9* 37.6 36.8  PLT 316 312 338   BMET Recent Labs    10/05/18 0116 10/05/18 0245 10/05/18 0803  NA 119* 120* 122*  K 3.7 3.7 3.5  CL 86* 88* 90*  CO2 21* 22 22  GLUCOSE 120* 121* 121*  BUN 7* 7* 6*  CREATININE 0.90 0.86 0.87  CALCIUM 8.5* 8.4* 8.2*    Studies/Results: Dg Abd 1 View  Result Date: 10/04/2018 CLINICAL DATA:  Abdominal pain and distension. EXAM: ABDOMEN - 1 VIEW COMPARISON:  10/04/2018 and prior radiographs FINDINGS: Gaseous distension of the colon is unchanged, greatest involving the cecum. Nondistended gas-filled loops of small bowel are present. Little significant change since the  study earlier today. IMPRESSION: Unchanged appearance of the abdomen with gaseous colonic distension, greatest involving the cecum. Electronically Signed   By: Margarette Canada M.D.   On: 10/04/2018 17:49   Dg Abd 1 View  Result Date: 10/04/2018 CLINICAL DATA:  Abdominal distention. EXAM: ABDOMEN - 1 VIEW COMPARISON:  Abdominal x-ray dated October 01, 2018. FINDINGS: Mildly worsened gaseous distention of the colon, most prominently involving the cecum. The cecum measures up to 18.4 cm in maximal diameter, previously 15.3 cm. Scattered air within nondilated small bowel. IMPRESSION: 1. Mildly worsened colonic distention most prominently involving the cecum. Electronically Signed   By: Titus Dubin M.D.   On: 10/04/2018 12:22     Assessment & Recommendations   1. Colonic ileus, chronic colon distention, chronic constipation, worsened over the last couple days. Electrolyte disturbances are likely contributing.   Her colon is chronically dilated so symptoms and abdominal exams are probably more helpful than colon measurements.  Correcting electrolytes is a high priority.  Continue rectal tube, IV Reglan. She is not a candidate for neostigmine.  She may need a NG tube to low intermittent suction if she is not improving over the course of the day. Would also consider pyridostigmine 60 mg TID in combination with Dulcolax suppositories BID if not improving today. However, this is an off label use.  Repeat abdominal films 10/06/18.  Another option would be octreotide 50 mcg QHS, but this is an off-label use with less supporting data than pyridostigmine.  LOS: 18 days   Thornton Park MD 10/05/2018, 4:14 PM

## 2018-10-05 NOTE — Progress Notes (Signed)
CRITICAL VALUE ALERT  Critical Value:  Sodium 119  Date & Time Notied:  8/31 2015  Provider Notified: Darrick Meigs MD  Orders Received/Actions taken: NS rate upped to 151ml/hr

## 2018-10-05 NOTE — Progress Notes (Signed)
I started NS bolus of 500 ml as ordered, and supposed to run fluid at 200 cc/hr after bolus. As bolus started , I heard pt started to wheeze, I could hear crackles. RT was on the floor, I told her to assess patient, RT suggested same , and said pt might need to go back to bi-pap and sounds fluid overloaded. I stooped bolus, pt had received 250 ml already out of 500 ml. I paged DR. Christian Rylee and updated on patient . MD asked how is her 02 sat and RR, I told her 02 sat is 98-99% in 3l o2 and RR is 13-17. I asked MD if she could come and see patient whenever she could not emergent. I told her I am little concerned for pt's breathing. MD said she will come by. Received order to stop bolus and start NS at 125ml/hr. Will continue to monitor.

## 2018-10-05 NOTE — Progress Notes (Addendum)
SLP Consult Note  Patient Details Name: [name] MRN: [id number] DOB: [birth year]   Orders received for speech consult to provide breathing exercises for pt with air hunger/dyspnea. Spoke with MD to clarify orders. If upper airway/voice/breath support is the issue, recommend consideration of ENT consult to visualize the pharyngeal region and upper airway to determine appropriate course of SLP treatment.   Please reconsult if further SLP intervention is warranted. ST signing off at this time. RN informed.  Celia B. Quentin [facility], CCC-SLP Speech Language Pathologist [phone]   Shonna Chock 10/05/2018, 2:28 PM

## 2018-10-05 NOTE — Progress Notes (Signed)
Per CCMD, patient had a 1.58 second pause. Patient asleep lying in bed. Son at bedside. No s/s of distress noted. PA paged via Amion system at this time to make aware.   Emelda Fear, RN

## 2018-10-05 NOTE — Progress Notes (Signed)
Occupational Therapy Treatment Patient Details Name: [name] MRN: [id number] DOB: December 27, 1924 Today's Date: 10/05/2018    History of present illness 85 y.o. female with past medical history of hypertension, ileus, pericarditis admitted with atrial flutter with rapid ventricular response, increased dyspnea and chest pain.  Also with epigastric pain.  Echocardiogram shows normal LV function and moderate aortic stenosis.   OT comments  Patient supine in bed, son present and agreeable to exercises at supine level but requested to hold off on mobility today due to distended abdomen.  Issued HEP and reviewed with son, completed exercises as below ([id number]). Will follow.    Follow Up Recommendations  SNF;Home health OT;Supervision/Assistance - 24 hour    Equipment Recommendations  3 in 1 bedside commode    Recommendations for Other Services      Precautions / Restrictions Precautions Precautions: Fall Restrictions Weight Bearing Restrictions: No       Mobility Bed Mobility               General bed mobility comments: +2 total assist to boost to [facility]  Transfers                 General transfer comment: deferred, pts son declines    Balance                                           ADL either performed or assessed with clinical judgement   ADL Overall ADL's : Needs assistance/impaired Eating/Feeding: Minimal assistance Eating/Feeding Details (indicate cue type and reason): assist to hold cup while taking a few sips from straw                                   General ADL Comments: pt son present, requested therapy to hold off on mobility due to abdomen-- initated HEP     Vision       Perception     Praxis      Cognition Arousal/Alertness: Lethargic Behavior During Therapy: WFL for tasks assessed/performed Overall Cognitive Status: Within Functional Limits for tasks assessed                                  General Comments: appears WFL, limited assessment due to lethargy        Exercises Exercises: Other exercises Other Exercises Other Exercises: completed 10 reps 1 set of supine shoulder flexion, forward press, and elbow flexion/extension    Shoulder Instructions       General Comments      Pertinent Vitals/ Pain       Pain Assessment: Faces Faces Pain Scale: Hurts a little bit Pain Location: distended stomach Pain Descriptors / Indicators: Grimacing;Guarding;Discomfort Pain Intervention(s): Limited activity within patient's tolerance;Repositioned;Monitored during session  Home Living                                          Prior Functioning/Environment              Frequency  Min 2X/week        Progress Toward Goals  OT Goals(current goals can now be found in the care plan section)  Progress towards OT goals: Not progressing toward goals - comment(limited mobility last few sessions)  Acute Rehab OT Goals Patient Stated Goal: to return home with son, decrease pain  Plan Discharge plan remains appropriate;Frequency remains appropriate    Co-evaluation                 AM-PAC OT "6 Clicks" Daily Activity     Outcome Measure   Help from another person eating meals?: A Little Help from another person taking care of personal grooming?: A Lot Help from another person toileting, which includes using toliet, bedpan, or urinal?: A Lot Help from another person bathing (including washing, rinsing, drying)?: A Lot Help from another person to put on and taking off regular upper body clothing?: A Lot Help from another person to put on and taking off regular lower body clothing?: Total 6 Click Score: 12    End of Session Equipment Utilized During Treatment: Oxygen  OT Visit Diagnosis: Other abnormalities of gait and mobility (R26.89);History of falling (Z91.81);Pain;Feeding difficulties (R63.3) Pain - part of body: (abdomen)    Activity Tolerance Patient limited by lethargy   Patient Left in bed;with call bell/phone within reach;with bed alarm set;with family/visitor present   Nurse Communication Mobility status        Time: [id number] OT Time Calculation (min): 13 min  Charges: OT General Charges $OT Visit: 1 Visit OT Treatments $Therapeutic Exercise: 8-22 mins  Delight Stare, OT [place] Pager [phone] Office [phone]    Delight Stare 10/05/2018, 12:18 PM

## 2018-10-05 NOTE — Plan of Care (Signed)
Continue to monitor

## 2018-10-05 NOTE — Progress Notes (Signed)
Two MDs from came by bedside and assessed pt. Said to call them back if pt's condition worsens. currently pt resting, will continue to monitor.

## 2018-10-05 NOTE — Progress Notes (Signed)
Pt found to have worsening hyponatremia tonight--118 from 122 earlier today. 532mL bolus +maintinance fluids ordered. Halfway through bolus, was paged to bedside by RN for concerns of worsening respiratory status. Dr. Maricela Bo and myself assessed patient. Diffuse wheezes heard throughout with possible crackles at left base.  Patient was resting comfortably while in room. O2 sats 98% on 3L [place]. VSS. Advised RN to discontinue bolus and start maintenance fluids at 165mL/hour. Repeat BMP at 0100 this am. Encouraged her to reach out to [place] with any further concerns or if pt's respiratory status declines.

## 2018-10-05 NOTE — Progress Notes (Signed)
Physical Therapy Treatment Patient Details Name: [name] MRN: [id number] DOB: Sep 03, 1924 Today's Date: 10/05/2018    History of Present Illness 85 y.o. female with past medical history of hypertension, ileus, pericarditis admitted with atrial flutter with rapid ventricular response, increased dyspnea and chest pain.  Also with epigastric pain.  Echocardiogram shows normal LV function and moderate aortic stenosis.    PT Comments    Pt admitted with above diagnosis. Pt was able to exercise UE with OT and LE with PT and gave exercise handouts to son.  Pt son refused for pt to get OOB today.   Pt currently with functional limitations due to the strength and balance as well as endurance deficits. Pt will benefit from skilled PT to increase their independence and safety with mobility to allow discharge to the venue listed below.     Follow Up Recommendations  Home health PT;Supervision for mobility/OOB;Supervision/Assistance - 24 hour     Equipment Recommendations  None recommended by PT    Recommendations for Other Services       Precautions / Restrictions Precautions Precautions: Fall Restrictions Weight Bearing Restrictions: No    Mobility  Bed Mobility               General bed mobility comments: +2 total assist to boost to [facility]  Transfers                 General transfer comment: deferred, pts son declines  Ambulation/Gait                 Stairs             Wheelchair Mobility    Modified Rankin (Stroke Patients Only)       Balance                                            Cognition Arousal/Alertness: Lethargic Behavior During Therapy: WFL for tasks assessed/performed Overall Cognitive Status: Within Functional Limits for tasks assessed                                 General Comments: appears WFL, limited assessment due to lethargy      Exercises General Exercises - Lower Extremity Ankle  Circles/Pumps: AROM;Both;10 reps;Supine Quad Sets: AROM;Both;10 reps;Supine Long Arc Quad: AROM;Both;10 reps;Supine Heel Slides: AROM;Both;10 reps;Supine Hip ABduction/ADduction: AROM;Both;10 reps;Supine Straight Leg Raises: AROM;Both;10 reps;Supine Other Exercises Other Exercises: Gave handout to son with exercises after pt performed.     General Comments        Pertinent Vitals/Pain Pain Assessment: Faces Faces Pain Scale: Hurts a little bit Pain Location: distended stomach Pain Descriptors / Indicators: Grimacing;Guarding;Discomfort Pain Intervention(s): Limited activity within patient's tolerance;Monitored during session;Premedicated before session;Repositioned    Home Living                      Prior Function            PT Goals (current goals can now be found in the care plan section) Acute Rehab PT Goals Patient Stated Goal: to return home with son, decrease pain Progress towards PT goals: Progressing toward goals    Frequency    Min 3X/week      PT Plan Current plan remains appropriate;Frequency needs to be updated    Co-evaluation  AM-PAC PT "6 Clicks" Mobility   Outcome Measure  Help needed turning from your back to your side while in a flat bed without using bedrails?: A Lot Help needed moving from lying on your back to sitting on the side of a flat bed without using bedrails?: A Lot Help needed moving to and from a bed to a chair (including a wheelchair)?: A Lot Help needed standing up from a chair using your arms (e.g., wheelchair or bedside chair)?: A Lot Help needed to walk in hospital room?: A Lot Help needed climbing 3-5 steps with a railing? : Total 6 Click Score: 11    End of Session Equipment Utilized During Treatment: Oxygen Activity Tolerance: Patient limited by fatigue Patient left: with call bell/phone within reach;with family/visitor present;in bed Nurse Communication: Mobility status PT Visit Diagnosis:  Muscle weakness (generalized) (M62.81);Difficulty in walking, not elsewhere classified (R26.2);Other abnormalities of gait and mobility (R26.89);Pain;Unsteadiness on feet (R26.81) Pain - Right/Left: (NG tube discomfort) Pain - part of body: Leg(bottom, vaginal area, legs)     Time: [id number] PT Time Calculation (min) (ACUTE ONLY): 12 min  Charges:  $Therapeutic Exercise: 8-22 mins                     [place] Pager:  [phone]  Office:  [place] 10/05/2018, 1:29 PM

## 2018-10-05 NOTE — Progress Notes (Signed)
ANTICOAGULATION CONSULT NOTE  Pharmacy Consult:  Heparin Indication: atrial fibrillation  Allergies  Allergen Reactions  . Diclofenac Sodium Anaphylaxis  . Ibuprofen Anaphylaxis  . Lisinopril     Fall hazard, dizzy  . Adhesive [Tape]   . Codeine Nausea Only  . Tizanidine     "knocked out cold"    Patient Measurements: Height: 5\' 6"  (167.6 cm) Weight: 207 lb 0.2 oz (93.9 kg) IBW/kg (Calculated) : 59.3 Heparin Dosing Weight: 80 kg  Vital Signs: Temp: 97.9 F (36.6 C) (08/31 0347) Temp Source: Axillary (08/31 0347) BP: 139/58 (08/31 0744) Pulse Rate: 74 (08/31 0744)  Labs: Recent Labs    10/03/18 0256 10/04/18 [id number] 10/04/18 1545  10/05/18 0116 10/05/18 0245 10/05/18 0315 10/05/18 0500 10/05/18 0803  HGB 12.0 12.8  --   --   --   --  12.1  --   --   HCT 35.9* 37.6  --   --   --   --  36.8  --   --   PLT 316 312  --   --   --   --  338  --   --   HEPARINUNFRC 0.38 0.19* 0.73*  --   --   --   --  0.59  --   CREATININE 0.98 0.82  --    < > 0.90 0.86  --   --  0.87   < > = values in this interval not displayed.    Estimated Creatinine Clearance: 48.6 mL/min (by C-G formula based on SCr of 0.87 mg/dL).  Assessment: 50 YOF with new atrial fibrillation this admit. Received IV heparin 8/14>>8/18, then changed to Eliquis 5 mg PO BID. Pt now transitioned back to IV heparin with ileus and inability to take PO 8/20.  Heparin level is therapeutic today, CBC stable. Pt tolerating POs so could likely transition back to Eliquis and PO diltiazem soon.  Goal of Therapy:  Heparin level 0.3-0.7 units/mL Monitor platelets by anticoagulation protocol: Yes   Plan:  -Continue heparin 1150 units/hr -Daily heparin level and CBC -F/U transitioning back to Eliquis   Arrie Senate, PharmD, BCPS Clinical Pharmacist [phone] Please check AMION for all [place] numbers 10/05/2018

## 2018-10-05 NOTE — Progress Notes (Signed)
Progress Note  Patient Name: [name] Date of Encounter: 10/05/2018  Primary Cardiologist: Shelva Majestic, MD   Subjective   Abdomen is  Distended and uncomfortable, mild SOB Remains in AFib with excellent rate control  Inpatient Medications    Scheduled Meds: . feeding supplement  1 Container Oral TID BM  . furosemide  40 mg Oral Daily  . metoCLOPramide (REGLAN) injection  10 mg Intravenous Q6H  . multivitamin with minerals  1 tablet Oral Daily  . polyethylene glycol  17 g Oral BID  . sodium chloride flush  3 mL Intravenous Q12H   Continuous Infusions: . diltiazem (CARDIZEM) infusion 15 mg/hr (10/05/18 0113)  . famotidine (PEPCID) IV 20 mg (10/04/18 2358)  . heparin 1,150 Units/hr (10/04/18 1654)  . magnesium sulfate bolus IVPB 2 g (10/05/18 0909)   PRN Meds: acetaminophen **OR** acetaminophen, albuterol, capsaicin, ipratropium-albuterol, lip balm, magic mouthwash, metoprolol tartrate, metroNIDAZOLE, ondansetron **OR** ondansetron (ZOFRAN) IV, phenol, simethicone, sodium chloride flush   Vital Signs    Vitals:   10/04/18 2335 10/05/18 0347 10/05/18 0535 10/05/18 0744  BP: 133/72 (!) 148/52  (!) 139/58  Pulse: 79 93 69 74  Resp: 13 18 14    Temp: 97.8 F (36.6 C) 97.9 F (36.6 C)    TempSrc: Axillary Axillary    SpO2: 99% 100% 100% 99%  Weight:   93.9 kg   Height:        Intake/Output Summary (Last 24 hours) at 10/05/2018 0949 Last data filed at 10/05/2018 0400 Gross per 24 hour  Intake 1406.92 ml  Output 1180 ml  Net 226.92 ml   Last 3 Weights 10/05/2018 10/04/2018 10/03/2018  Weight (lbs) 207 lb 0.2 oz 216 lb 14.9 oz 207 lb 0.2 oz  Weight (kg) 93.9 kg 98.4 kg 93.9 kg      Telemetry    AFib with good rate control - Personally Reviewed  ECG    No new tracing - Personally Reviewed  Physical Exam    GEN: No acute distress.   Neck: No JVD Cardiac: irregular, SEM RUSB. No gallop.  Respiratory: Clear to auscultation bilaterally. GI: distended ,  reduced bowel sounds, tender MS: No edema; No deformity. Neuro:  Nonfocal  Psych: Normal affect   Labs    High Sensitivity Troponin:   Recent Labs  Lab 10/02/2018 1015 09/13/2018 1235 09/21/18 0407 09/21/18 0923 09/21/18 1107  TROPONINIHS 45* 48* 14 11 13       Chemistry Recent Labs  Lab 10/05/18 0116 10/05/18 0245 10/05/18 0803  NA 119* 120* 122*  K 3.7 3.7 3.5  CL 86* 88* 90*  CO2 21* 22 22  GLUCOSE 120* 121* 121*  BUN 7* 7* 6*  CREATININE 0.90 0.86 0.87  CALCIUM 8.5* 8.4* 8.2*  GFRNONAA 56* 59* 58*  GFRAA >60 >60 >60  ANIONGAP 12 10 10      Hematology Recent Labs  Lab 10/03/18 0256 10/04/18 0652 10/05/18 0315  WBC 10.4 10.3 11.0*  RBC 3.96 4.26 4.05  HGB 12.0 12.8 12.1  HCT 35.9* 37.6 36.8  MCV 90.7 88.3 90.9  MCH 30.3 30.0 29.9  MCHC 33.4 34.0 32.9  RDW 12.4 12.3 12.3  PLT 316 312 338    BNPNo results for input(s): BNP, PROBNP in the last 168 hours.   DDimer No results for input(s): DDIMER in the last 168 hours.   Radiology    Dg Abd 1 View  Result Date: 10/04/2018 CLINICAL DATA:  Abdominal pain and distension. EXAM: ABDOMEN - 1 VIEW COMPARISON:  10/04/2018 and prior radiographs FINDINGS: Gaseous distension of the colon is unchanged, greatest involving the cecum. Nondistended gas-filled loops of small bowel are present. Little significant change since the study earlier today. IMPRESSION: Unchanged appearance of the abdomen with gaseous colonic distension, greatest involving the cecum. Electronically Signed   By: Margarette Canada M.D.   On: 10/04/2018 17:49   Dg Abd 1 View  Result Date: 10/04/2018 CLINICAL DATA:  Abdominal distention. EXAM: ABDOMEN - 1 VIEW COMPARISON:  Abdominal x-ray dated October 01, 2018. FINDINGS: Mildly worsened gaseous distention of the colon, most prominently involving the cecum. The cecum measures up to 18.4 cm in maximal diameter, previously 15.3 cm. Scattered air within nondilated small bowel. IMPRESSION: 1. Mildly worsened colonic  distention most prominently involving the cecum. Electronically Signed   By: Titus Dubin M.D.   On: 10/04/2018 12:22    Cardiac Studies   09/18/2018 echo  1. The left ventricle has normal systolic function, with an ejection fraction of 55-60%. The cavity size was normal. There is moderately increased left ventricular wall thickness. Left ventricular diastolic function could not be evaluated secondary to  atrial fibrillation. 2. The right ventricle has normal systolic function. The cavity was normal. There is no increase in right ventricular wall thickness. Right ventricular systolic pressure could not be assessed. 3. The mitral valve is grossly normal. 4. The tricuspid valve is grossly normal. 5. The aortic valve was not well visualized. Moderate calcification of the aortic valve. Aortic valve regurgitation was not assessed by color flow Doppler. 6. Very poor visualization of the AV. Cannot assess number of cusps. There is thickening and calcificaiton of the AV. By doppler there is mild to moderate AS with man AVG 71mmHg and Vmax 276cm/s. 7. The aorta is normal unless otherwise noted. 8. The inferior vena cava was dilated in size with <50% respiratory variability. 9. The interatrial septum appears to be lipomatous. 10. Recommend repeat limited echo o f the AV.  Patient Profile     85 y.o. female with moderate aortic stenosis, chronic diastolic heart failure, recent onset atrial fibrillation/atrial flutter, recurrent episodes of ileus.  Assessment & Plan    1.  Persistent atrial fibrillation: Well rate controlled on IV cardizem. Transition to PO diltiazem may be difficult since she has chronic ileus. It would be better to consider a beta blocker when able to take po's. Also resume DOAC once has normalization of intestinal function.  Chronicity of Afib is uncertain.  Last EKG documenting sinus rhythm was in 2013.   2. Chronic diastolic CHF: Her recurrent problems with symptoms  of heart failure may be related to mobilization of third space fluids.  May require intermittent dosing with intravenous furosemide.  currently volume status appears pretty good.  3. AS: moderate. This has progressed compared to previous years but is still not severe.  she is a poor candidate for TAVR.  4. Hyponatremia: improved today to 122. Likely due to GI third spacing and SIADH  For questions or updates, please contact [place] Please consult [email or web address] for contact info under        Signed, Peter Martinique, MD  10/05/2018, 9:49 AM

## 2018-10-05 NOTE — Progress Notes (Signed)
DR. Darrick Meigs Rylee called for update on pt. Updated MD that in and out cath was completed with 400 urine output per charge RN, and placing rectal tube was unsuccessful  X multiple times as patient was pushing it out. Charge nurse did talk to MD and updated her. Asked if they want to try flexiseal tube. MD stated sge will put the order in. Will continue to monitor.

## 2018-10-05 NOTE — Progress Notes (Signed)
Called dr. Darrick Meigs Rylee and updated bladder scan was just 20 cc and pt did not tolarate bladder scan well. verified order for felxiseal placement. MD stated will wait for foley cath placement, will watch if pt makes any urine. Will continue to monitor.

## 2018-10-05 NOTE — Progress Notes (Signed)
Subjective:   The patient stated that she continues to feel poorly. She is breathing about the same which is not well, per her. She did not like the new rectal tube but agrees that it may be needed. She did not attempt to eat much of the food that was brought her although they did bring a large variety of foods. She is overall still concerned with her breathing (it should be noted that she is saturating well with a normal heart rate).  Discussed that we are coordinating care with GI again to try and alleviate her abdominal distention. All questions and concerns addressed.   Objective:  Vital signs in last 24 hours: Vitals:   10/04/18 2102 10/04/18 2335 10/05/18 0347 10/05/18 0535  BP:  133/72 (!) 148/52   Pulse:  79 93 69  Resp:  13 18 14   Temp: 97.6 F (36.4 C) 97.8 F (36.6 C) 97.9 F (36.6 C)   TempSrc: Axillary Axillary Axillary   SpO2:  99% 100% 100%  Weight:    93.9 kg  Height:       Physical Exam Vitals signs and nursing note reviewed.  Constitutional:      General: She is not in acute distress.    Appearance: She is obese. She is not toxic-appearing.  Neck:     Vascular: No JVD.  Cardiovascular:     Rate and Rhythm: Normal rate. Rhythm irregular.     Heart sounds: Murmur (systolic 2/6) present.  Pulmonary:     Effort: Pulmonary effort is normal. No accessory muscle usage or respiratory distress.     Breath sounds: Wheezing (Diffuse and expiratory. Mostly upper airway ) and rales (Chronic lower lobe rales, left more than right. Unchanged from prior exams) present.  Abdominal:     General: Bowel sounds are absent. There is distension (More distented than before).     Palpations: Abdomen is soft.     Tenderness: There is generalized abdominal tenderness. There is guarding.     Comments: Bowel sounds are more decreased today than prior exam  Skin:    General: Skin is warm and dry.  Neurological:     General: No focal deficit present.     Mental Status: She is  alert and oriented to person, place, and time.    Assessment/Plan:  Principal Problem:   Atrial fibrillation with RVR (HCC) Active Problems:   Moderate aortic stenosis   Ileus (HCC)   Pleural effusion   Abdominal distention   Pneumonia   Nasogastric tube present   Respiratory difficulty  In summary, [name] is a 85 y/o female with a PMHx of HTN and ileus who was admitted for new onset A. Fib with RVR. She developed some emesis initially on admission with possible aspiration, and was treated for aspiration pneumonia. Hospital stay has also been complicated by ileus development.   #Atrial fibrillation with atrial flutter CHADsVAC 5-6 with HASBLED of 1. Echocardiogramon 8/14with normal LVEF, no LAE/RAE, but moderate AS. Heart rate has been very well controlled and maintains in the 70s to 80s.  Continue IV treatment until resolution of ileus.  - Cardiologyon board and we appreciate the recommendations - Replace K to maintain >4.0 and Mg >2.0 - Continue with Diltiazem drip and Heparin until patient can tolerate PO again. - Metoprolol PRN for HR >120   #Ileus GI has recommended continuing MiraLAX twice daily and prune juice. They signed off on 08/27.  Repeat KUB on 10/04/2018 shows a mild worsening of ileus  and the cecum now distended approximately 18 cm, when priorly it was distended 15 cm.  GI had been reconsulted due to this.  Started IV Reglan and restarted rectal tube.  They note electrolyte abnormalities may be significantly contributing to patient's symptoms.  Her potassium has been incredibly difficult to control, as no matter how much it is repleted daily it continues to be low the next morning.  If no improvement, may need to go back to n.p.o. with NG tube. Overall, not much improvement since symptom onset approximately 2 weeks ago.  - Maintain K >4.0 - PRN zofran for nausea  - IV protonix 40 mg QD  - Miralax BID - Prune juice - IV Reglan - Rectal Tube    #Shortness of breath  Patient has had bilateral rales throughout admission that have been unchanged.  In addition, her wheezing appears to be more upper airway than the typical lower airway type of wheezing.  It really is not at significantly alleviated by any treatment, but DuoNeb seems to do the most.  We have also been doing Lasix daily, however given her hyponatremia and lack of clinical findings of volume overload, this may need to be stopped.  - Proventil and Duoneb PRNfor wheezing. - Lasix 40 mg daily p.o.  # Hyponatremia:  Overnight, sodium dropped to 118.  Fluids were restarted and sodium slowly began to climb, indicating possible hypovolemia in light of reduced p.o. intake and daily Lasix.  To avoid further drop in sodium, Lasix was discontinued and will start gentle hydration with normal saline.  Will monitor BMP closely  - BMPs q6hrs - Normal Saline 69mL/hr  #Hypertension: Stable #Aspiration pneumonia:Resolved   Dispo: Anticipated dischargepending medical improvement.   Dr. Jose Persia Internal Medicine PGY-1  Pager: [phone] 10/05/2018, 6:50 AM

## 2018-10-05 NOTE — Plan of Care (Signed)
Poc progressing.  

## 2018-10-06 ENCOUNTER — Inpatient Hospital Stay (HOSPITAL_COMMUNITY): Payer: Medicare Other

## 2018-10-06 DIAGNOSIS — I110 Hypertensive heart disease with heart failure: Secondary | ICD-10-CM

## 2018-10-06 DIAGNOSIS — E861 Hypovolemia: Secondary | ICD-10-CM

## 2018-10-06 DIAGNOSIS — Z66 Do not resuscitate: Secondary | ICD-10-CM

## 2018-10-06 DIAGNOSIS — I5032 Chronic diastolic (congestive) heart failure: Secondary | ICD-10-CM

## 2018-10-06 LAB — BASIC METABOLIC PANEL
Anion gap: 9 (ref 5–15)
Anion gap: 10 (ref 5–15)
Anion gap: 10 (ref 5–15)
BUN: 5 mg/dL — ABNORMAL LOW (ref 8–23)
BUN: <5 mg/dL — ABNORMAL LOW (ref 8–23)
BUN: 6 mg/dL — ABNORMAL LOW (ref 8–23)
CO2: 22 mmol/L (ref 22–32)
CO2: 20 mmol/L — ABNORMAL LOW (ref 22–32)
CO2: 19 mmol/L — ABNORMAL LOW (ref 22–32)
Calcium: 8.4 mg/dL — ABNORMAL LOW (ref 8.9–10.3)
Calcium: 8.3 mg/dL — ABNORMAL LOW (ref 8.9–10.3)
Calcium: 8.2 mg/dL — ABNORMAL LOW (ref 8.9–10.3)
Chloride: 91 mmol/L — ABNORMAL LOW (ref 98–111)
Chloride: 92 mmol/L — ABNORMAL LOW (ref 98–111)
Chloride: 93 mmol/L — ABNORMAL LOW (ref 98–111)
Creatinine, Ser: 0.93 mg/dL (ref 0.44–1.00)
Creatinine, Ser: 0.81 mg/dL (ref 0.44–1.00)
Creatinine, Ser: 0.8 mg/dL (ref 0.44–1.00)
GFR calc Af Amer: >60 mL/min (ref >60)
GFR calc Af Amer: >60 mL/min (ref >60)
GFR calc Af Amer: >60 mL/min (ref >60)
GFR calc non Af Amer: 54 mL/min — ABNORMAL LOW (ref >60)
GFR calc non Af Amer: >60 mL/min (ref >60)
GFR calc non Af Amer: >60 mL/min (ref >60)
Glucose, Bld: 108 mg/dL — ABNORMAL HIGH (ref 70–99)
Glucose, Bld: 145 mg/dL — ABNORMAL HIGH (ref 70–99)
Glucose, Bld: 172 mg/dL — ABNORMAL HIGH (ref 70–99)
Potassium: 4.1 mmol/L (ref 3.5–5.1)
Potassium: 4.2 mmol/L (ref 3.5–5.1)
Potassium: 4.2 mmol/L (ref 3.5–5.1)
Sodium: 122 mmol/L — ABNORMAL LOW (ref 135–145)
Sodium: 122 mmol/L — ABNORMAL LOW (ref 135–145)
Sodium: 122 mmol/L — ABNORMAL LOW (ref 135–145)

## 2018-10-06 LAB — ALBUMIN: Albumin: 2.7 g/dL — ABNORMAL LOW (ref 3.5–5.0)

## 2018-10-06 LAB — HEPARIN LEVEL (UNFRACTIONATED): Heparin Unfractionated: 0.44 IU/mL (ref 0.30–0.70)

## 2018-10-06 LAB — CBC
HCT: 37.4 % (ref 36.0–46.0)
Hemoglobin: 12.6 g/dL (ref 12.0–15.0)
MCH: 30.1 pg (ref 26.0–34.0)
MCHC: 33.7 g/dL (ref 30.0–36.0)
MCV: 89.3 fL (ref 80.0–100.0)
Platelets: 311 10*3/uL (ref 150–400)
RBC: 4.19 MIL/uL (ref 3.87–5.11)
RDW: 12.5 % (ref 11.5–15.5)
WBC: 7.7 10*3/uL (ref 4.0–10.5)
nRBC: 0 % (ref 0.0–0.2)

## 2018-10-06 LAB — MAGNESIUM: Magnesium: 1.8 mg/dL (ref 1.7–2.4)

## 2018-10-06 MED ORDER — MAGNESIUM SULFATE 2 GM/50ML IV SOLN
2.0000 g | Freq: Once | INTRAVENOUS | Status: AC
  Administered 2018-10-06: 2 g via INTRAVENOUS
  Filled 2018-10-06: qty 50

## 2018-10-06 MED ORDER — PRO-STAT SUGAR FREE PO LIQD
30.0000 mL | Freq: Two times a day (BID) | ORAL | Status: DC
  Administered 2018-10-06 – 2018-10-10 (×4): 30 mL via ORAL
  Filled 2018-10-06 (×6): qty 30

## 2018-10-06 MED ORDER — POTASSIUM CHLORIDE CRYS ER 20 MEQ PO TBCR
40.0000 meq | EXTENDED_RELEASE_TABLET | Freq: Two times a day (BID) | ORAL | Status: DC
  Administered 2018-10-06 (×2): 40 meq via ORAL
  Filled 2018-10-06 (×2): qty 2

## 2018-10-06 MED ORDER — PYRIDOSTIGMINE BROMIDE 60 MG PO TABS
60.0000 mg | ORAL_TABLET | Freq: Three times a day (TID) | ORAL | Status: DC
  Administered 2018-10-06 – 2018-10-07 (×5): 60 mg via ORAL
  Filled 2018-10-06 (×11): qty 1

## 2018-10-06 MED ORDER — BISACODYL 10 MG RE SUPP
10.0000 mg | Freq: Two times a day (BID) | RECTAL | Status: DC
  Administered 2018-10-06 – 2018-10-12 (×10): 10 mg via RECTAL
  Filled 2018-10-06 (×11): qty 1

## 2018-10-06 NOTE — Progress Notes (Addendum)
Progress Note   Subjective  Ongoing abdominal distention and diffuse pain. No nausea or vomiting. Shortness of breath has improved.  No family present at the time of my evaluation but the patient has asked me to call her son. Additional brown liquid stool and air present in the rectal tube bag.    Objective  Vital signs in last 24 hours: Temp:  [97.4 F (36.3 C)-97.7 F (36.5 C)] 97.4 F (36.3 C) (09/01 0746) Pulse Rate:  [70-83] 71 (09/01 0746) Resp:  [13-20] 15 (09/01 0746) BP: (112-138)/(53-86) 133/61 (09/01 0746) SpO2:  [96 %-100 %] 100 % (09/01 0746) Weight:  [91.9 kg] 91.9 kg (09/01 0329) Last BM Date: 10/05/18  General: Alert, elderly, well-developed, more comfortable appearing today Abdomen:  Soft, protuberent. Distended.  Mild generalized tenderness with the most pain in the left mid abdomen. Tympany is improved. Near absent bowel sounds. No rebound or guarding.   Extremities:  Without edema. Neurologic:  Alert and  oriented x4; grossly normal neurologically. Psych:  Alert and cooperative. Normal mood and affect.  Intake/Output from previous day: 08/31 0701 - 09/01 0700 In: 1854.6 [I.V.:1754.6; IV Piggyback:100] Out: [id number] [Urine:1475; Stool:300] Intake/Output this shift: No intake/output data recorded.  Lab Results: Recent Labs    10/04/18 0652 10/05/18 0315 10/06/18 0526  WBC 10.3 11.0* 7.7  HGB 12.8 12.1 12.6  HCT 37.6 36.8 37.4  PLT 312 338 311   BMET Recent Labs    10/05/18 1822 10/05/18 2225 10/06/18 0526  NA 119* 121* 122*  K 3.6 3.8 4.1  CL 85* 89* 91*  CO2 22 21* 22  GLUCOSE 127* 104* 108*  BUN <5* 5* 5*  CREATININE 0.91 0.76 0.93  CALCIUM 8.0* 8.1* 8.4*    Studies/Results: Dg Abd 1 View  Result Date: 10/06/2018 CLINICAL DATA:  Abdominal pain and distention, ileus EXAM: ABDOMEN - 1 VIEW COMPARISON:  10/04/2018 FINDINGS: Colonic distention diffusely with marked gaseous distension of the cecum across the midline measuring up to 18.9 cm  transverse. No significant small bowel distension. No bowel wall thickening. Bones demineralized with degenerative changes of lumbar spine. Prior LEFT femoral ORIF. No urinary tract calcifications. IMPRESSION: Significant gaseous distention of colon especially cecum which measures 18.9 cm transverse. Findings called to [place] on 4E on 10/06/2018 at at 0902 hours. Electronically Signed   By: Lavonia Dana M.D.   On: 10/06/2018 09:04   Dg Abd 1 View  Result Date: 10/04/2018 CLINICAL DATA:  Abdominal pain and distension. EXAM: ABDOMEN - 1 VIEW COMPARISON:  10/04/2018 and prior radiographs FINDINGS: Gaseous distension of the colon is unchanged, greatest involving the cecum. Nondistended gas-filled loops of small bowel are present. Little significant change since the study earlier today. IMPRESSION: Unchanged appearance of the abdomen with gaseous colonic distension, greatest involving the cecum. Electronically Signed   By: Margarette Canada M.D.   On: 10/04/2018 17:49   Dg Abd 1 View  Result Date: 10/04/2018 CLINICAL DATA:  Abdominal distention. EXAM: ABDOMEN - 1 VIEW COMPARISON:  Abdominal x-ray dated October 01, 2018. FINDINGS: Mildly worsened gaseous distention of the colon, most prominently involving the cecum. The cecum measures up to 18.4 cm in maximal diameter, previously 15.3 cm. Scattered air within nondilated small bowel. IMPRESSION: 1. Mildly worsened colonic distention most prominently involving the cecum. Electronically Signed   By: Titus Dubin M.D.   On: 10/04/2018 12:22     Assessment & Recommendations   Colonic ileus, chronic colon distention, chronic constipation, worsened over the last  couple days. Electrolyte disturbances are likely contributing.   Her colon is chronically dilated so symptoms and abdominal exams are probably more helpful than colon measurements. Abdominal films today show persistent dilation.   Correcting electrolytes is of the highest priority.   She is not a candidate  for neostigmine.   I discussed a trial of off-label pyridostigmine 60 mg TID in combination with Dulcolax suppositories BID with the patient and her son (son Fraser Din at [phone]).  This will require discontinuing her rectal tube. I would strongly favor concurrent use of an NG tube to low intermittent suction. Continue IV Reglan. If this does not work, another option would be octreotide 50 mcg QHS, but this is another off-label use with less supporting data than pyridostigmine.      LOS: 19 days   Thornton Park MD 10/06/2018, 9:24 AM

## 2018-10-06 NOTE — Progress Notes (Signed)
Progress Note  Patient Name: [name] Date of Encounter: 10/06/2018  Primary Cardiologist: Shelva Majestic, MD   Subjective   Abdomen is  Distended and sore, breathing is better. Remains in AFib with excellent rate control  Inpatient Medications    Scheduled Meds: . feeding supplement  1 Container Oral TID BM  . metoCLOPramide (REGLAN) injection  10 mg Intravenous Q6H  . multivitamin with minerals  1 tablet Oral Daily  . polyethylene glycol  17 g Oral BID  . potassium chloride  40 mEq Oral BID  . sodium chloride flush  3 mL Intravenous Q12H   Continuous Infusions: . sodium chloride 100 mL/hr at 10/06/18 0518  . diltiazem (CARDIZEM) infusion 15 mg/hr (10/06/18 0518)  . famotidine (PEPCID) IV Stopped (10/05/18 2156)  . heparin 1,150 Units/hr (10/06/18 0716)  . magnesium sulfate bolus IVPB     PRN Meds: acetaminophen **OR** acetaminophen, albuterol, capsaicin, ipratropium-albuterol, lip balm, magic mouthwash, metoprolol tartrate, metroNIDAZOLE, ondansetron **OR** ondansetron (ZOFRAN) IV, phenol, simethicone, sodium chloride flush   Vital Signs    Vitals:   10/05/18 2000 10/06/18 0000 10/06/18 0329 10/06/18 0746  BP: 137/62 (!) 112/55 (!) 122/53 133/61  Pulse: 73 71 71 71  Resp: 16 13 14 15   Temp:   97.7 F (36.5 C) (!) 97.4 F (36.3 C)  TempSrc:   Oral Oral  SpO2: 97% 98% 98% 100%  Weight:   91.9 kg   Height:        Intake/Output Summary (Last 24 hours) at 10/06/2018 0817 Last data filed at 10/06/2018 0518 Gross per 24 hour  Intake 1854.55 ml  Output 1775 ml  Net 79.55 ml   Last 3 Weights 10/06/2018 10/05/2018 10/04/2018  Weight (lbs) 202 lb 9.6 oz 207 lb 0.2 oz 216 lb 14.9 oz  Weight (kg) 91.9 kg 93.9 kg 98.4 kg      Telemetry    AFib with good rate control - Personally Reviewed  ECG    No new tracing - Personally Reviewed  Physical Exam    GEN: No acute distress.   Neck: No JVD Cardiac: irregular, SEM RUSB. No gallop.  Respiratory: Clear to  auscultation bilaterally. GI: distended , reduced bowel sounds MS: No edema; No deformity. Neuro:  Nonfocal  Psych: Normal affect   Labs    High Sensitivity Troponin:   Recent Labs  Lab 09/13/2018 1015 09/14/2018 1235 09/21/18 0407 09/21/18 0923 09/21/18 1107  TROPONINIHS 45* 48* 14 11 13       Chemistry Recent Labs  Lab 10/05/18 1822 10/05/18 2225 10/06/18 0526  NA 119* 121* 122*  K 3.6 3.8 4.1  CL 85* 89* 91*  CO2 22 21* 22  GLUCOSE 127* 104* 108*  BUN <5* 5* 5*  CREATININE 0.91 0.76 0.93  CALCIUM 8.0* 8.1* 8.4*  ALBUMIN  --   --  2.7*  GFRNONAA 55* >60 54*  GFRAA >60 >60 >60  ANIONGAP 12 11 9      Hematology Recent Labs  Lab 10/04/18 0652 10/05/18 0315 10/06/18 0526  WBC 10.3 11.0* 7.7  RBC 4.26 4.05 4.19  HGB 12.8 12.1 12.6  HCT 37.6 36.8 37.4  MCV 88.3 90.9 89.3  MCH 30.0 29.9 30.1  MCHC 34.0 32.9 33.7  RDW 12.3 12.3 12.5  PLT 312 338 311    BNPNo results for input(s): BNP, PROBNP in the last 168 hours.   DDimer No results for input(s): DDIMER in the last 168 hours.   Radiology    Dg Abd 1 View  Result Date: 10/04/2018 CLINICAL DATA:  Abdominal pain and distension. EXAM: ABDOMEN - 1 VIEW COMPARISON:  10/04/2018 and prior radiographs FINDINGS: Gaseous distension of the colon is unchanged, greatest involving the cecum. Nondistended gas-filled loops of small bowel are present. Little significant change since the study earlier today. IMPRESSION: Unchanged appearance of the abdomen with gaseous colonic distension, greatest involving the cecum. Electronically Signed   By: Margarette Canada M.D.   On: 10/04/2018 17:49   Dg Abd 1 View  Result Date: 10/04/2018 CLINICAL DATA:  Abdominal distention. EXAM: ABDOMEN - 1 VIEW COMPARISON:  Abdominal x-ray dated October 01, 2018. FINDINGS: Mildly worsened gaseous distention of the colon, most prominently involving the cecum. The cecum measures up to 18.4 cm in maximal diameter, previously 15.3 cm. Scattered air within  nondilated small bowel. IMPRESSION: 1. Mildly worsened colonic distention most prominently involving the cecum. Electronically Signed   By: Titus Dubin M.D.   On: 10/04/2018 12:22    Cardiac Studies   09/18/2018 echo  1. The left ventricle has normal systolic function, with an ejection fraction of 55-60%. The cavity size was normal. There is moderately increased left ventricular wall thickness. Left ventricular diastolic function could not be evaluated secondary to  atrial fibrillation. 2. The right ventricle has normal systolic function. The cavity was normal. There is no increase in right ventricular wall thickness. Right ventricular systolic pressure could not be assessed. 3. The mitral valve is grossly normal. 4. The tricuspid valve is grossly normal. 5. The aortic valve was not well visualized. Moderate calcification of the aortic valve. Aortic valve regurgitation was not assessed by color flow Doppler. 6. Very poor visualization of the AV. Cannot assess number of cusps. There is thickening and calcificaiton of the AV. By doppler there is mild to moderate AS with man AVG 33mmHg and Vmax 276cm/s. 7. The aorta is normal unless otherwise noted. 8. The inferior vena cava was dilated in size with <50% respiratory variability. 9. The interatrial septum appears to be lipomatous. 10. Recommend repeat limited echo o f the AV.  Patient Profile     85 y.o. female with moderate aortic stenosis, chronic diastolic heart failure, recent onset atrial fibrillation/atrial flutter, recurrent episodes of ileus.  Assessment & Plan    1.  Persistent atrial fibrillation: Well rate controlled on IV cardizem. Transition to PO diltiazem may be difficult since she has chronic ileus. It would be better to consider a beta blocker when able to take po's. Also resume DOAC once has normalization of intestinal function.  Chronicity of Afib is uncertain.  Last EKG documenting sinus rhythm was in 2013.    2. Chronic diastolic CHF: Her recurrent problems with symptoms of heart failure may be related to mobilization of third space fluids.  May require intermittent dosing with intravenous furosemide.  currently volume status appears pretty good. Weight is down since admission and is at her baseline weight noted in June.  3. AS: moderate. This has progressed compared to previous years but is still not severe.  she is a poor candidate for TAVR.  4. Hyponatremia: improved today to 122. 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

## 2018-10-06 NOTE — Progress Notes (Signed)
CRITICAL VALUE ALERT  Critical Value:  Cecum 18.9 cm  Date & Time Notied:  10/06/18 @0905   Provider Notified: FMTS paged  Orders Received/Actions taken: new orders as needed

## 2018-10-06 NOTE — Plan of Care (Addendum)
Problem: Clinical Measurements: Goal: Respiratory complications will improve 10/06/2018 0112 by Jaymes Graff, RN Outcome: Progressing Note: Pt denies significant SOB. Sats 100% on 3L. RR 15. Will closely monitor for signs of fluid overload as pt's fluids increased.    Problem: Nutrition: Goal: Adequate nutrition will be maintained 10/06/2018 0112 by Jaymes Graff, RN Outcome: Progressing Note: Per son, pt was able to drink down maybe half a styrofoam cup's worth of beef broth for dinner before feeling full. No nausea complaints   Problem: Pain Managment: Goal: General experience of comfort will improve Outcome: Progressing Note: Pt denies abdominal pain unless pressed upon. Still quite distended

## 2018-10-06 NOTE — Progress Notes (Addendum)
Nutrition Follow-up  DOCUMENTATION CODES:   Not applicable  INTERVENTION:   Recommend initiation of TPN or goals of care discussion as pt is day 19 without adequate nutrition.    Continue Boost Breeze po TID, each supplement provides 250 kcal and 9 grams of protein  Add 30 ml Prostat BID, each supplement provides 100 kcals and 15 grams protein.   NUTRITION DIAGNOSIS:   Increased nutrient needs related to acute illness as evidenced by estimated needs.  Ongoing  GOAL:   Patient will meet greater than or equal to 90% of their needs  Not meeting  MONITOR:   PO intake, Supplement acceptance, Diet advancement, Skin, Weight trends, Labs, I & O's  REASON FOR ASSESSMENT:   NPO/Clear Liquid Diet    ASSESSMENT:   Patient with PMH significant for HTN, HLD, and previous SBO. Presents this admission with colonic ileus.    8/24- NGT placed  Repeat KUB shows dilation of cecum. Pt with ongoing abdominal distention. Denies nausea/vomiting. Rectal tube removed. GI recommends NG to suction and IV reglan. Meal completions charted as 0-50% for her last eight meals on a clear liquid diet. It looks as if pt is drinking Boost Breeze 1-2 times daily. Discussed plan with internal medicine. Plan is to speak with GI regarding TPN. Will continue supplements.   Admission weight: 95.7 kg Current weight: 91.9 kg   I/O: -1,466 ml since admit UOP: 1,475 ml x 24 hrs Rectal tube: 300 ml x 24 hrs   Drips: NS @ 100 ml/hr  Medications: dulcolox, 10 mg reglan QID, MVI with minerals, miralax, 40 mEq KCl BID,  Labs: Na 122 (L) CBG    Diet Order:   Diet Order            Diet clear liquid Room service appropriate? No; Fluid consistency: Thin  Diet effective now              EDUCATION NEEDS:   Not appropriate for education at this time  Skin:  Skin Assessment: Skin Integrity Issues: Skin Integrity Issues:: Other (Comment) Other: MASD- buttocks, nose  Last BM:  8/31  Height:   Ht  Readings from Last 1 Encounters:  09/23/2018 5\' 6"  (1.676 m)    Weight:   Wt Readings from Last 1 Encounters:  10/06/18 91.9 kg    Ideal Body Weight:  59.1 kg  BMI:  Body mass index is 32.7 kg/m.  Estimated Nutritional Needs:   Kcal:  1700-1900 kcal  Protein:  85-100 grams  Fluid:  >/= 1.7 L/day   Mariana Single RD, LDN Clinical Nutrition Pager # - [phone]

## 2018-10-06 NOTE — Progress Notes (Signed)
ANTICOAGULATION CONSULT NOTE  Pharmacy Consult:  Heparin Indication: atrial fibrillation  Allergies  Allergen Reactions  . Diclofenac Sodium Anaphylaxis  . Ibuprofen Anaphylaxis  . Lisinopril     Fall hazard, dizzy  . Adhesive [Tape]   . Codeine Nausea Only  . Tizanidine     "knocked out cold"    Patient Measurements: Height: 5\' 6"  (167.6 cm) Weight: 202 lb 9.6 oz (91.9 kg) IBW/kg (Calculated) : 59.3 Heparin Dosing Weight: 80 kg  Vital Signs: Temp: 97.4 F (36.3 C) (09/01 0746) Temp Source: Oral (09/01 0746) BP: 133/61 (09/01 0746) Pulse Rate: 71 (09/01 0746)  Labs: Recent Labs    10/04/18 [id number] 10/04/18 1545  10/05/18 0315 10/05/18 0500  10/05/18 1822 10/05/18 2225 10/06/18 0526  HGB 12.8  --   --  12.1  --   --   --   --  12.6  HCT 37.6  --   --  36.8  --   --   --   --  37.4  PLT 312  --   --  338  --   --   --   --  311  HEPARINUNFRC 0.19* 0.73*  --   --  0.59  --   --   --  0.44  CREATININE 0.82  --    < >  --   --    < > 0.91 0.76 0.93   < > = values in this interval not displayed.    Estimated Creatinine Clearance: 45 mL/min (by C-G formula based on SCr of 0.93 mg/dL).  Assessment: 89 YOF with new atrial fibrillation this admit. Received IV heparin 8/14>>8/18, then changed to Eliquis 5 mg PO BID. Pt now transitioned back to IV heparin with ileus and inability to take PO 8/20.  Heparin level is therapeutic today, CBC stable.   Pt tolerating POs so could likely transition back to Eliquis and PO diltiazem soon?  Goal of Therapy:  Heparin level 0.3-0.7 units/mL Monitor platelets by anticoagulation protocol: Yes   Plan:  -Continue heparin 1150 units/hr -Daily heparin level and CBC -F/U transitioning back to Eliquis   Anette Guarneri, PharmD Clinical Pharmacist [phone] Please check AMION for all [facility] Pharmacy numbers 10/06/2018

## 2018-10-06 NOTE — Progress Notes (Signed)
Internal Medicine Teaching Service Attending:   I saw and examined the patient. I reviewed the resident's note and I agree with the resident's findings and plan as documented in the resident's note.  Principal Problem:   Ileus ([place]) Active Problems:   Moderate aortic stenosis   Hyponatremia   Atrial fibrillation with RVR (HCC)   Pleural effusion   Pneumonia  Hospital day #19 for this 85 year old woman admitted for atrial fibrillation with RVR but now struggling with a acute on chronic colonic ileus causing severe abdominal distention, discomfort, and oral intolerance.  Greatly appreciate GI consultation and management of this difficult to treat ileus.  She has had some amount of ileus dating back to 2016, the degree of distention also suggest that this is a acute on chronic issue.  Thankfully she has not shown any signs of perforation or bowel ischemia despite the severe distention.  The teams have made efforts at supportive care, correcting electrolyte imbalance, increasing mobility, avoiding opioids, IV reglan, tap water enemas, and at this point after a discussion of risks and benefits we are proceeding with pyridostigmine.  Given her discomfort today I agree with trial of NG tube again to try to decompress some of the upper gas distention.  The ileus is complicated by a hypovolemic hyponatremia.  Serum sodium is down to 122 today, she is taking in very little oral intake due to the ileus. This is complicated by intermittent lasix due to dyspnea and chronic HFpEF. She is at risk for third spacing volume given hypoalbuminemia. We will recommend adding more IV fluid to keep up with the insensible losses and try to slowly expand her intravascular volume, though it may lead to pulmonary edema.   Atrial fibrillation seems to be well controlled on diltiazem with cardiology consultation.  This was recently transitioned to oral diltiazem, though I am not sure how well she will absorb this given the ileus  and especially for going to replace the NG tube with intermittent suction today.  She is anticoagulated with a heparin infusion.  I am worried about her overall prognosis, this is been a prolonged hospitalization complicated by deconditioning.  Patient appears uncomfortable today due to abdominal distention and anxiety.  We are continuing full scope of care, there is a DO NOT RESUSCITATE order in the chart.  I think if we do not make any progress with medical treatment of the ileus it would be advisable to recommend consultation with palliative care team to try to relieve some of these symptoms.  Lalla Brothers, MD FACP

## 2018-10-06 NOTE — Progress Notes (Signed)
Rectal tube discontinued per MD order without difficulty.  Will continue to monitor.

## 2018-10-06 NOTE — Progress Notes (Signed)
  Subjective:  Pt seen on AM rounds at the bedside today. Pt says she feels like her belly is full and is thirsty, noting there is water in the room but out of her reach. Pt asks that we come back when her son comes in to visit.    Objective:    Vital Signs (last 24 hours): Vitals:   10/05/18 1943 10/05/18 2000 10/06/18 0000 10/06/18 0329  BP: 124/68 137/62 (!) 112/55 (!) 122/53  Pulse: 75 73 71 71  Resp: 15 16 13 14   Temp: (!) 97.5 F (36.4 C)   97.7 F (36.5 C)  TempSrc: Oral   Oral  SpO2: 96% 97% 98% 98%  Weight:    91.9 kg  Height:         Physical Exam: General Awake and responds to questions appropriately  Cardiac Regular rate and rhythm, no murmurs, rubs, or gallops  Pulmonary Clear to auscultation bilaterally without wheezes, rhonchi, or rales  Abdominal Distended abdomen. Generalized tenderness  Extremities No peripheral edema     Assessment/Plan:   Principal Problem:   Atrial fibrillation with RVR (HCC) Active Problems:   Moderate aortic stenosis   Ileus (HCC)   Pleural effusion   Abdominal distention   Pneumonia   Nasogastric tube present   Respiratory difficulty  Patient is a 85 year old female who was initially mated for A. fib with RVR and he remains hospitalized due to ileus.  # Ileus: Repeat KUB shows 18.9 cm dilation of cecum - increased from 15.3 cm. GI recommends that given chronic dilation, symptoms/exams likely more beneficial than colon measurements. GI recommends priorities on correcting electrolyte abnormalities as these may be contributory.  * Reglan 10 mg Q6HR IV and miralax 17 g BID * GI evaluated today and recommends pyridostigmine 60 mg TID in combination with Dulcolax suppositories BID given lack of improvement - rectal tube removed. Also recommend NG tube with intermittent low pressure given extent of ileus.  # Atrial fibrillation with atrial flutter: Chads vas 5-6, echocardiogram on 8/14 with normal LVEF but moderate aortic stenosis.   Heart rate has been well controlled and maintained in the 70s to 80s, will continue with IV treatment with diltiazem, heparin and PRN metoprolol untill resolution of ileus/toleration of PO.   Dispo: Anticipated discharge pending clinical improvement.   Jeanmarie Hubert, MD 10/06/2018, 7:16 AM Pager: [phone]

## 2018-10-06 NOTE — Care Management Important Message (Signed)
Important Message  Patient Details  Name: [name] MRN: [id number] Date of Birth: 1924-09-07   Medicare Important Message Given:  Yes     Shelda Altes 10/06/2018, 12:08 PM

## 2018-10-06 DEATH — deceased

## 2018-10-07 ENCOUNTER — Inpatient Hospital Stay (HOSPITAL_COMMUNITY): Payer: Medicare Other

## 2018-10-07 LAB — SODIUM, URINE, RANDOM: Sodium, Ur: 88 mmol/L

## 2018-10-07 LAB — BASIC METABOLIC PANEL
Anion gap: 8 (ref 5–15)
Anion gap: 10 (ref 5–15)
Anion gap: 9 (ref 5–15)
BUN: 8 mg/dL (ref 8–23)
BUN: 7 mg/dL — ABNORMAL LOW (ref 8–23)
BUN: 5 mg/dL — ABNORMAL LOW (ref 8–23)
CO2: 19 mmol/L — ABNORMAL LOW (ref 22–32)
CO2: 18 mmol/L — ABNORMAL LOW (ref 22–32)
CO2: 18 mmol/L — ABNORMAL LOW (ref 22–32)
Calcium: 8 mg/dL — ABNORMAL LOW (ref 8.9–10.3)
Calcium: 8.4 mg/dL — ABNORMAL LOW (ref 8.9–10.3)
Calcium: 8.3 mg/dL — ABNORMAL LOW (ref 8.9–10.3)
Chloride: 95 mmol/L — ABNORMAL LOW (ref 98–111)
Chloride: 96 mmol/L — ABNORMAL LOW (ref 98–111)
Chloride: 95 mmol/L — ABNORMAL LOW (ref 98–111)
Creatinine, Ser: 0.76 mg/dL (ref 0.44–1.00)
Creatinine, Ser: 0.91 mg/dL (ref 0.44–1.00)
Creatinine, Ser: 0.65 mg/dL (ref 0.44–1.00)
GFR calc Af Amer: >60 mL/min (ref >60)
GFR calc Af Amer: >60 mL/min (ref >60)
GFR calc Af Amer: >60 mL/min (ref >60)
GFR calc non Af Amer: >60 mL/min (ref >60)
GFR calc non Af Amer: 55 mL/min — ABNORMAL LOW (ref >60)
GFR calc non Af Amer: >60 mL/min (ref >60)
Glucose, Bld: 101 mg/dL — ABNORMAL HIGH (ref 70–99)
Glucose, Bld: 111 mg/dL — ABNORMAL HIGH (ref 70–99)
Glucose, Bld: 113 mg/dL — ABNORMAL HIGH (ref 70–99)
Potassium: 4.7 mmol/L (ref 3.5–5.1)
Potassium: 4.7 mmol/L (ref 3.5–5.1)
Potassium: 4.5 mmol/L (ref 3.5–5.1)
Sodium: 122 mmol/L — ABNORMAL LOW (ref 135–145)
Sodium: 124 mmol/L — ABNORMAL LOW (ref 135–145)
Sodium: 122 mmol/L — ABNORMAL LOW (ref 135–145)

## 2018-10-07 LAB — CBC
HCT: 35.1 % — ABNORMAL LOW (ref 36.0–46.0)
Hemoglobin: 11.4 g/dL — ABNORMAL LOW (ref 12.0–15.0)
MCH: 30.1 pg (ref 26.0–34.0)
MCHC: 32.5 g/dL (ref 30.0–36.0)
MCV: 92.6 fL (ref 80.0–100.0)
Platelets: 345 10*3/uL (ref 150–400)
RBC: 3.79 MIL/uL — ABNORMAL LOW (ref 3.87–5.11)
RDW: 13 % (ref 11.5–15.5)
WBC: 7.1 10*3/uL (ref 4.0–10.5)
nRBC: 0 % (ref 0.0–0.2)

## 2018-10-07 LAB — PHOSPHORUS: Phosphorus: 2.3 mg/dL — ABNORMAL LOW (ref 2.5–4.6)

## 2018-10-07 LAB — MAGNESIUM: Magnesium: 1.8 mg/dL (ref 1.7–2.4)

## 2018-10-07 LAB — HEPARIN LEVEL (UNFRACTIONATED): Heparin Unfractionated: 0.34 IU/mL (ref 0.30–0.70)

## 2018-10-07 LAB — OSMOLALITY, URINE: Osmolality, Ur: 420 mOsm/kg (ref 300–900)

## 2018-10-07 MED ORDER — MAGNESIUM SULFATE 4 GM/100ML IV SOLN
4.0000 g | Freq: Once | INTRAVENOUS | Status: AC
  Administered 2018-10-07: 11:00:00 4 g via INTRAVENOUS
  Filled 2018-10-07: qty 100

## 2018-10-07 MED ORDER — POTASSIUM CHLORIDE CRYS ER 20 MEQ PO TBCR
20.0000 meq | EXTENDED_RELEASE_TABLET | Freq: Two times a day (BID) | ORAL | Status: DC
  Administered 2018-10-07: 14:00:00 20 meq via ORAL
  Filled 2018-10-07 (×2): qty 1

## 2018-10-07 NOTE — Progress Notes (Signed)
ANTICOAGULATION CONSULT NOTE  Pharmacy Consult:  Heparin Indication: atrial fibrillation  Allergies  Allergen Reactions  . Diclofenac Sodium Anaphylaxis  . Ibuprofen Anaphylaxis  . Lisinopril     Fall hazard, dizzy  . Adhesive [Tape]   . Codeine Nausea Only  . Tizanidine     "knocked out cold"    Patient Measurements: Height: 5\' 6"  (167.6 cm) Weight: 207 lb 14.3 oz (94.3 kg) IBW/kg (Calculated) : 59.3 Heparin Dosing Weight: 80 kg  Vital Signs: Temp: 98.1 F (36.7 C) (09/02 0740) Temp Source: Oral (09/02 0740) BP: 134/67 (09/02 0740) Pulse Rate: 66 (09/02 0740)  Labs: Recent Labs    10/05/18 0315 10/05/18 0500  10/06/18 0526 10/06/18 1140 10/06/18 1900 10/07/18 0328  HGB 12.1  --   --  12.6  --   --  11.4*  HCT 36.8  --   --  37.4  --   --  35.1*  PLT 338  --   --  311  --   --  345  HEPARINUNFRC  --  0.59  --  0.44  --   --  0.34  CREATININE  --   --    < > 0.93 0.81 0.80 0.76   < > = values in this interval not displayed.    Estimated Creatinine Clearance: 53 mL/min (by C-G formula based on SCr of 0.76 mg/dL).  Assessment: 89 YOF with new atrial fibrillation this admit. Received IV heparin 8/14>>8/18, then changed to Eliquis 5 mg PO BID. Pt now transitioned back to IV heparin with ileus and inability to take PO 8/20.  Heparin level is therapeutic today, CBC stable.     Goal of Therapy:  Heparin level 0.3-0.7 units/mL Monitor platelets by anticoagulation protocol: Yes   Plan:  -Continue heparin 1150 units/hr -Daily heparin level and CBC -F/U transitioning back to Eliquis   Anette Guarneri, PharmD Clinical Pharmacist [phone] Please check AMION for all [facility] Pharmacy numbers 10/07/2018

## 2018-10-07 NOTE — Progress Notes (Signed)
RT assessed pt for need of BIPAP and pt not in need. BIPAP is contraindicated d/t NG in place at this time. RT will continue to monitor pt need for BIPAP.

## 2018-10-07 NOTE — Progress Notes (Signed)
  Subjective:  Pt seen on AM rounds at the bedside today.  Patient reports that she feels similar to before.  We discussed with patient that her condition may not improve with the current therapy, and that the medications we would use to make her more comfortable and improve her pain would not work well with treatment.  We asked patient if she had discussed with her son what their goals were for her care.  Patient asked about surgical options.   Objective:    Vital Signs (last 24 hours): Vitals:   10/06/18 2000 10/07/18 0000 10/07/18 0332 10/07/18 0740  BP: (!) 159/71 (!) 153/67 135/70 134/67  Pulse: 77 85 82 66  Resp: 18 17 16  (!) 22  Temp: 97.8 F (36.6 C) 97.8 F (36.6 C) 97.7 F (36.5 C) 98.1 F (36.7 C)  TempSrc: Oral Oral Oral Oral  SpO2: 99% 97% 100% 94%  Weight:   94.3 kg   Height:         Physical Exam: General Awake and responds to questions appropriately  Cardiac Regular rate and rhythm, no murmurs, rubs, or gallops  Pulmonary Clear to auscultation bilaterally without wheezes, rhonchi, or rales  Abdominal Distended abdomen. Generalized tenderness  Extremities No peripheral edema     Assessment/Plan:   Principal Problem:   Ileus (HCC) Active Problems:   Moderate aortic stenosis   Hyponatremia   Atrial fibrillation with RVR (HCC)   Pleural effusion   Pneumonia  Patient is a 85 year old female who was initially mated for A. fib with RVR and he remains hospitalized due to ileus.  # Ileus: KUB shows 18.9 cm dilation of cecum. Potassium stable, increased magnesium supplement, sodium still low but stable, will check phosphorus * Reglan 10 mg Q6HR IV, miralax 17 g BID, pyridostigmine 60 mg TID with dulcolax suppositories * Benefits of NG tube was discussed with patient by Dr. Tarri Glenn and patient refused procedure. We will continue to encourage this therapy to patient as it could provide some decompression. * Spoke with Dr. Tarri Glenn who agrees that there are no  surgical options for her condition. Colonoscopy is sometimes used for decompression but the concern is that it would just recur following the procedure as the cause is not corrected  # Hyponatremia: Sodium of 122, stable from yesterday. NS 100 mL/hr. Previously urine studies were performed when patient was taking lasix.  * Will recheck urine osmolality/sodium  # Atrial fibrillation with atrial flutter: Chads vas 5-6, echocardiogram on 8/14 with normal LVEF but moderate aortic stenosis.  Heart rate has been well controlled and maintained in the 70s to 80s, will continue with IV treatment with diltiazem, heparin and PRN metoprolol untill resolution of ileus/toleration of PO.   Dispo: Anticipated discharge pending clinical improvement.   Jeanmarie Hubert, MD 10/07/2018, 11:22 AM Pager: [phone]

## 2018-10-07 NOTE — Progress Notes (Addendum)
    Progress Note   Subjective  Ongoing abdominal distention and diffuse pain. Perhaps slightly better although overall not changed.  No nausea or vomiting. She remains resistant to an NG tube. Shortness of breath and nonproductive cough of concern to the patient.   No family present at the time of my evaluation.    Objective  Vital signs in last 24 hours: Temp:  [97.7 F (36.5 C)-98.1 F (36.7 C)] 98.1 F (36.7 C) (09/02 0740) Pulse Rate:  [66-85] 66 (09/02 0740) Resp:  [13-22] 22 (09/02 0740) BP: (134-159)/(58-71) 134/67 (09/02 0740) SpO2:  [94 %-100 %] 94 % (09/02 0740) Weight:  [94.3 kg] 94.3 kg (09/02 0332) Last BM Date: 10/07/18  General: Alert, elderly, well-developed,uncomfortable today Abdomen:  Soft, protuberent. Distended.  Mild generalized tenderness with the most pain remaining in the left mid abdomen. Tympany persists. Near absent bowel sounds. No rebound or guarding.  Liquid brown stool noted in the bed sheets.  Extremities:  Without edema. Neurologic:  Alert and  oriented x4; grossly normal neurologically. Psych:  Alert and cooperative. Normal mood and affect.  Intake/Output from previous day: 09/01 0701 - 09/02 0700 In: 3344.4 [P.O.:300; I.V.:2994.4; IV Piggyback:50] Out: 300 [Urine:300] Intake/Output this shift: Total I/O In: -  Out: 500 [Urine:500]  Lab Results: Recent Labs    10/05/18 0315 10/06/18 0526 10/07/18 0328  WBC 11.0* 7.7 7.1  HGB 12.1 12.6 11.4*  HCT 36.8 37.4 35.1*  PLT 338 311 345   BMET Recent Labs    10/06/18 1140 10/06/18 1900 10/07/18 0328  NA 122* 122* 122*  K 4.2 4.2 4.7  CL 92* 93* 95*  CO2 20* 19* 19*  GLUCOSE 145* 172* 101*  BUN <5* 6* 8  CREATININE 0.81 0.80 0.76  CALCIUM 8.3* 8.2* 8.0*    Studies/Results: Dg Abd 1 View  Result Date: 10/06/2018 CLINICAL DATA:  Abdominal pain and distention, ileus EXAM: ABDOMEN - 1 VIEW COMPARISON:  10/04/2018 FINDINGS: Colonic distention diffusely with marked gaseous distension  of the cecum across the midline measuring up to 18.9 cm transverse. No significant small bowel distension. No bowel wall thickening. Bones demineralized with degenerative changes of lumbar spine. Prior LEFT femoral ORIF. No urinary tract calcifications. IMPRESSION: Significant gaseous distention of colon especially cecum which measures 18.9 cm transverse. Findings called to [place] on 4E on 10/06/2018 at at 0902 hours. Electronically Signed   By: Lavonia Dana M.D.   On: 10/06/2018 09:04     Assessment & Recommendations   Acute ileus in the setting of chronic colon distension and constipation worsened by electrolyte disturbances.    Primary team remains focused on correcting her electrolytes, which should ultimately improve her ileus.   An NG tube to low intermittent suction may provide significant relief in this setting and I have encouraged the patient to reconsider. Hoping to see some improvement with the off-label trial of pyridostigmine 60 mg TID in combination with Dulcolax suppositories BID over the next 24-36 hours. Continue IV Reglan. Consider repeat abdominal films tomorrow.      LOS: 20 days   Thornton Park MD 10/07/2018, 11:05 AM

## 2018-10-07 NOTE — Progress Notes (Signed)
Progress Note  Patient Name: [name] Date of Encounter: 10/07/2018  Primary Cardiologist: Shelva Majestic, MD   Subjective   Abdomen is  Distended and sore, complains of increased abdominal pain. Notes more SOB and some cough Remains in AFib with excellent rate control  Inpatient Medications    Scheduled Meds: . bisacodyl  10 mg Rectal BID  . feeding supplement  1 Container Oral TID BM  . feeding supplement (PRO-STAT SUGAR FREE 64)  30 mL Oral BID  . metoCLOPramide (REGLAN) injection  10 mg Intravenous Q6H  . multivitamin with minerals  1 tablet Oral Daily  . polyethylene glycol  17 g Oral BID  . potassium chloride  40 mEq Oral BID  . pyridostigmine  60 mg Oral Q8H  . sodium chloride flush  3 mL Intravenous Q12H   Continuous Infusions: . sodium chloride 100 mL/hr at 10/07/18 0127  . diltiazem (CARDIZEM) infusion 15 mg/hr (10/07/18 0716)  . famotidine (PEPCID) IV Stopped (10/06/18 2137)  . heparin 1,150 Units/hr (10/07/18 0309)   PRN Meds: acetaminophen **OR** acetaminophen, albuterol, capsaicin, ipratropium-albuterol, lip balm, magic mouthwash, metoprolol tartrate, metroNIDAZOLE, ondansetron **OR** ondansetron (ZOFRAN) IV, phenol, simethicone, sodium chloride flush   Vital Signs    Vitals:   10/06/18 2000 10/07/18 0000 10/07/18 0332 10/07/18 0740  BP: (!) 159/71 (!) 153/67 135/70 134/67  Pulse: 77 85 82 66  Resp: 18 17 16  (!) 22  Temp: 97.8 F (36.6 C) 97.8 F (36.6 C) 97.7 F (36.5 C) 98.1 F (36.7 C)  TempSrc: Oral Oral Oral Oral  SpO2: 99% 97% 100% 94%  Weight:   94.3 kg   Height:        Intake/Output Summary (Last 24 hours) at 10/07/2018 0901 Last data filed at 10/07/2018 0807 Gross per 24 hour  Intake 3344.41 ml  Output 800 ml  Net 2544.41 ml   Last 3 Weights 10/07/2018 10/06/2018 10/05/2018  Weight (lbs) 207 lb 14.3 oz 202 lb 9.6 oz 207 lb 0.2 oz  Weight (kg) 94.3 kg 91.9 kg 93.9 kg      Telemetry    AFib with good rate control - Personally Reviewed  ECG    No new tracing - Personally Reviewed  Physical Exam    GEN: appears very uncomfortable.  Neck: No JVD Cardiac: irregular, SEM RUSB. No gallop.  Respiratory: Clear to auscultation bilaterally. GI: distended , reduced bowel sounds, tender MS: No edema; No deformity. Neuro:  Nonfocal  Psych: Normal affect   Labs    High Sensitivity Troponin:   Recent Labs  Lab 09/27/2018 1015 09/18/2018 1235 09/21/18 0407 09/21/18 0923 09/21/18 1107  TROPONINIHS 45* 48* 14 11 13       Chemistry Recent Labs  Lab 10/06/18 0526 10/06/18 1140 10/06/18 1900 10/07/18 0328  NA 122* 122* 122* 122*  K 4.1 4.2 4.2 4.7  CL 91* 92* 93* 95*  CO2 22 20* 19* 19*  GLUCOSE 108* 145* 172* 101*  BUN 5* <5* 6* 8  CREATININE 0.93 0.81 0.80 0.76  CALCIUM 8.4* 8.3* 8.2* 8.0*  ALBUMIN 2.7*  --   --   --   GFRNONAA 54* >60 >60 >60  GFRAA >60 >60 >60 >60  ANIONGAP 9 10 10 8      Hematology Recent Labs  Lab 10/05/18 0315 10/06/18 0526 10/07/18 0328  WBC 11.0* 7.7 7.1  RBC 4.05 4.19 3.79*  HGB 12.1 12.6 11.4*  HCT 36.8 37.4 35.1*  MCV 90.9 89.3 92.6  MCH 29.9 30.1 30.1  MCHC  32.9 33.7 32.5  RDW 12.3 12.5 13.0  PLT 338 311 345    BNPNo results for input(s): BNP, PROBNP in the last 168 hours.   DDimer No results for input(s): DDIMER in the last 168 hours.   Radiology    Dg Abd 1 View  Result Date: 10/06/2018 CLINICAL DATA:  Abdominal pain and distention, ileus EXAM: ABDOMEN - 1 VIEW COMPARISON:  10/04/2018 FINDINGS: Colonic distention diffusely with marked gaseous distension of the cecum across the midline measuring up to 18.9 cm transverse. No significant small bowel distension. No bowel wall thickening. Bones demineralized with degenerative changes of lumbar spine. Prior LEFT femoral ORIF. No urinary tract calcifications. IMPRESSION: Significant gaseous distention of colon especially cecum which measures 18.9 cm transverse. Findings called to [place] on 4E on 10/06/2018 at at 0902 hours.  Electronically Signed   By: Lavonia Dana M.D.   On: 10/06/2018 09:04    Cardiac Studies   09/18/2018 echo  1. The left ventricle has normal systolic function, with an ejection fraction of 55-60%. The cavity size was normal. There is moderately increased left ventricular wall thickness. Left ventricular diastolic function could not be evaluated secondary to  atrial fibrillation. 2. The right ventricle has normal systolic function. The cavity was normal. There is no increase in right ventricular wall thickness. Right ventricular systolic pressure could not be assessed. 3. The mitral valve is grossly normal. 4. The tricuspid valve is grossly normal. 5. The aortic valve was not well visualized. Moderate calcification of the aortic valve. Aortic valve regurgitation was not assessed by color flow Doppler. 6. Very poor visualization of the AV. Cannot assess number of cusps. There is thickening and calcificaiton of the AV. By doppler there is mild to moderate AS with man AVG 73mmHg and Vmax 276cm/s. 7. The aorta is normal unless otherwise noted. 8. The inferior vena cava was dilated in size with <50% respiratory variability. 9. The interatrial septum appears to be lipomatous. 10. Recommend repeat limited echo o f the AV.  Patient Profile     85 y.o. female with moderate aortic stenosis, chronic diastolic heart failure, recent onset atrial fibrillation/atrial flutter, recurrent episodes of ileus.  Assessment & Plan    1.  Persistent atrial fibrillation: Well rate controlled on IV cardizem. Unable to transition to po rate control due to persistent ileus.  It would be better to consider a beta blocker when able to take po's. Also resume DOAC once has normalization of intestinal function.  Chronicity of Afib is uncertain.  Last EKG documenting sinus rhythm was in 2013.   2. Chronic diastolic CHF: Her recurrent problems with symptoms of heart failure may be related to mobilization of third  space fluids.  May require intermittent dosing with intravenous furosemide.  weight is up some today with IV fluids but oxygen levels are good. Will monitor.   3. AS: moderate. This has progressed compared to previous years but is still not severe.  she is a poor candidate for TAVR.  4. Hyponatremia: improved today to 122 and stable. Likely due to GI third spacing and SIADH  For questions or updates, please contact [place] Please consult [email or web address] for contact info under        Signed, Peter Martinique, MD  10/07/2018, 9:01 AM

## 2018-10-07 NOTE — Progress Notes (Signed)
PT Cancellation Note  Patient Details Name: [name] MRN: [id number] DOB: Feb 20, 1924   Cancelled Treatment:    Reason Eval/Treat Not Completed: Other (comment).  Nausea, painful abd and back pain were consistently an issue in two attempts, and is now going to receive an NG tube to relieve her symptoms from ileus.  Follow up tomorrow to see if she is more tolerant of mobility.   Ramond Dial 10/07/2018, 3:34 PM   Mee Hives, PT MS Acute Rehab Dept. Number: [place] and [place]

## 2018-10-07 NOTE — Progress Notes (Signed)
OT Cancellation Note  Patient Details Name: [name] MRN: [id number] DOB: Aug 19, 1924   Cancelled Treatment:    Reason Eval/Treat Not Completed: Patient declined, no reason specified. Attempted x2 today, patient declined participation reporting back pain and abdominal discomfort. Will follow and see as able.   Delight Stare, OT Acute Rehabilitation Services Pager [phone] Office [phone]   Delight Stare 10/07/2018, 3:27 PM

## 2018-10-08 ENCOUNTER — Inpatient Hospital Stay (HOSPITAL_COMMUNITY): Payer: Medicare Other

## 2018-10-08 DIAGNOSIS — E43 Unspecified severe protein-calorie malnutrition: Secondary | ICD-10-CM

## 2018-10-08 LAB — BASIC METABOLIC PANEL
Anion gap: 10 (ref 5–15)
Anion gap: 10 (ref 5–15)
BUN: 6 mg/dL — ABNORMAL LOW (ref 8–23)
BUN: <5 mg/dL — ABNORMAL LOW (ref 8–23)
CO2: 18 mmol/L — ABNORMAL LOW (ref 22–32)
CO2: 19 mmol/L — ABNORMAL LOW (ref 22–32)
Calcium: 7.9 mg/dL — ABNORMAL LOW (ref 8.9–10.3)
Calcium: 8.3 mg/dL — ABNORMAL LOW (ref 8.9–10.3)
Chloride: 95 mmol/L — ABNORMAL LOW (ref 98–111)
Chloride: 95 mmol/L — ABNORMAL LOW (ref 98–111)
Creatinine, Ser: 0.66 mg/dL (ref 0.44–1.00)
Creatinine, Ser: 0.64 mg/dL (ref 0.44–1.00)
GFR calc Af Amer: >60 mL/min (ref >60)
GFR calc Af Amer: >60 mL/min (ref >60)
GFR calc non Af Amer: >60 mL/min (ref >60)
GFR calc non Af Amer: >60 mL/min (ref >60)
Glucose, Bld: 109 mg/dL — ABNORMAL HIGH (ref 70–99)
Glucose, Bld: 110 mg/dL — ABNORMAL HIGH (ref 70–99)
Potassium: 4.2 mmol/L (ref 3.5–5.1)
Potassium: 4.3 mmol/L (ref 3.5–5.1)
Sodium: 123 mmol/L — ABNORMAL LOW (ref 135–145)
Sodium: 124 mmol/L — ABNORMAL LOW (ref 135–145)

## 2018-10-08 LAB — MAGNESIUM: Magnesium: 2.1 mg/dL (ref 1.7–2.4)

## 2018-10-08 LAB — HEPARIN LEVEL (UNFRACTIONATED): Heparin Unfractionated: 0.6 IU/mL (ref 0.30–0.70)

## 2018-10-08 MED ORDER — POTASSIUM PHOSPHATES 15 MMOLE/5ML IV SOLN
20.0000 mmol | Freq: Once | INTRAVENOUS | Status: AC
  Administered 2018-10-08: 19:00:00 20 mmol via INTRAVENOUS
  Filled 2018-10-08: qty 6.67

## 2018-10-08 MED ORDER — POTASSIUM & SODIUM PHOSPHATES 280-160-250 MG PO PACK
1.0000 | PACK | Freq: Three times a day (TID) | ORAL | Status: DC
  Filled 2018-10-08 (×4): qty 1

## 2018-10-08 NOTE — Progress Notes (Addendum)
    Progress Note   Subjective  Feeling somewhat better after the placement of the NG tube. Had a small bowel movement this morning. No nausea or vomiting.  No family present at the time of my evaluation.    Objective  Vital signs in last 24 hours: Temp:  [97.5 F (36.4 C)-98 F (36.7 C)] 97.5 F (36.4 C) (09/03 1110) Pulse Rate:  [70-75] 75 (09/03 1110) Resp:  [13-21] 21 (09/03 1110) BP: (130-148)/(55-76) 139/76 (09/03 1110) SpO2:  [98 %-100 %] 100 % (09/03 1110) Weight:  [94.5 kg] 94.5 kg (09/03 0420) Last BM Date: 10/07/18  General: Alert, elderly, well-developed, more comfortable today, NG tube present Abdomen:  Soft, protuberent. Less distended. Less tympany. No abdominal pain.  Near absent bowel sounds. No rebound or guarding.  Extremities:  Without edema. Neurologic:  Alert and  oriented x4; grossly normal neurologically. Psych:  Alert and cooperative. Normal mood and affect.  Intake/Output from previous day: 09/02 0701 - 09/03 0700 In: -  Out: 1200 [Urine:1200] Intake/Output this shift: No intake/output data recorded.  Lab Results: Recent Labs    10/06/18 0526 10/07/18 0328  WBC 7.7 7.1  HGB 12.6 11.4*  HCT 37.4 35.1*  PLT 311 345   BMET Recent Labs    10/07/18 1045 10/07/18 1822 10/08/18 0243  NA 124* 122* 123*  K 4.7 4.5 4.2  CL 96* 95* 95*  CO2 18* 18* 18*  GLUCOSE 111* 113* 109*  BUN 7* 5* 6*  CREATININE 0.91 0.65 0.66  CALCIUM 8.4* 8.3* 7.9*    Studies/Results: Dg Abd 1 View  Result Date: 10/08/2018 CLINICAL DATA:  Ileus. EXAM: ABDOMEN - 1 VIEW COMPARISON:  Abdominal x-ray from yesterday. FINDINGS: Unchanged enteric tube tip near the gastroduodenal junction. Unchanged marked gaseous distention of the cecum measuring up to 18 cm, unchanged. No significant small bowel dilatation. No acute osseous abnormality. Unchanged small left pleural effusion. IMPRESSION: 1. Unchanged prominent gaseous distention of the cecum. Electronically Signed   By:  Titus Dubin M.D.   On: 10/08/2018 08:55   Dg Abd Portable 1v  Result Date: 10/07/2018 CLINICAL DATA:  NG tube EXAM: PORTABLE ABDOMEN - 1 VIEW COMPARISON:  10/06/2018 FINDINGS: Esophageal tube tip overlies the gastroduodenal junction. Small bilateral pleural effusions and airspace disease at the left base. Dilated bowel in the upper abdomen. IMPRESSION: 1. Esophageal tube tip overlies the gastroduodenal junction 2. Tiny pleural effusions and basilar airspace disease on the left 3. Dilated bowel in the upper abdomen Electronically Signed   By: Donavan Foil M.D.   On: 10/07/2018 17:56     Assessment & Recommendations   Acute ileus in the setting of chronic colon distension and constipation worsened by electrolyte disturbances. Hyponatremia can both initiate and propagate ileus.  Abdominal films are unchanged. She had a small bowel movement earlier today.   Correction of her hyponatremia and any other electrolyte abnormalities recommended. Continue NG tube. Today is day 3 of pyridostigmine 60 mg TID in combination with Dulcolax suppositories BID. I hope to see a response within the next 24 hours if we are going to see one. Continue IV Reglan. Decompressive colonoscopy is only temporizing. There are no surgical options for prolonged ileus.   I was unsuccessful in reaching her son, Fraser Din, by telephone this morning. I left a message.      LOS: 21 days   Th[place] MD 10/08/2018, 11:25 AM

## 2018-10-08 NOTE — Progress Notes (Signed)
Patient unable to tolerate PO.  Noted coughing and regurgitating.  NG tube noted not suctioning contents into suction container.  NG tube appeared clogged, flushed and noted 150cc in return.  Flushed x 4 today with total output from NG tube 350cc dark green with brown & white chunks/sediment.   Md notified

## 2018-10-08 NOTE — Progress Notes (Signed)
Physical Therapy Treatment Patient Details Name: [name] MRN: [id number] DOB: [birth year] Today's Date: 10/08/2018    History of Present Illness 85 y.o. female with past medical history of hypertension, ileus, pericarditis admitted with atrial flutter with rapid ventricular response, increased dyspnea and chest pain.  Also with epigastric pain.  Echocardiogram shows normal LV function and moderate aortic stenosis.    PT Comments    Pt admitted with above diagnosis. Pt and son agreeable with PT session today.  Pt was able to pivot to recliner with min to mod assist with RW.  Did not want to perform exercises but only to get to chair.  She tolerated it well.   Pt met 3/6 goals.  Goals revised today. Pt currently with functional limitations due to balance and endurance deficits. Pt will benefit from skilled PT to increase their independence and safety with mobility to allow discharge to the venue listed below.     Follow Up Recommendations  Home health PT;Supervision for mobility/OOB;Supervision/Assistance - 24 hour     Equipment Recommendations  None recommended by PT    Recommendations for Other Services       Precautions / Restrictions Precautions Precautions: Fall Restrictions Weight Bearing Restrictions: No    Mobility  Bed Mobility Overal bed mobility: Needs Assistance Bed Mobility: Supine to Sit;Sit to Supine     Supine to sit: Min assist;+2 for safety/equipment;Mod assist     General bed mobility comments: Assist to elevate trunk.  Assist to move LES to initiate movement.   Transfers Overall transfer level: Needs assistance Equipment used: Rolling walker (2 wheeled) Transfers: Sit to/from [facility] Sit to Stand: +2 safety/equipment;Min assist Stand pivot transfers: Min assist;+2 safety/equipment       General transfer comment: Assist to power up.  Pt needed a few seconds to stand and get balance with min steadying assist and then was able to  pivot to chair.  Pt with oozing BM therefore cleaned her bottom.    Ambulation/Gait                 Stairs             Wheelchair Mobility    Modified Rankin (Stroke Patients Only)       Balance Overall balance assessment: Needs assistance Sitting-balance support: Feet supported Sitting balance-Leahy Scale: Good     Standing balance support: Bilateral upper extremity supported;During functional activity Standing balance-Leahy Scale: Poor Standing balance comment: relies on UE support                            Cognition Arousal/Alertness: Awake/alert Behavior During Therapy: WFL for tasks assessed/performed Overall Cognitive Status: Within Functional Limits for tasks assessed                                        Exercises      General Comments General comments (skin integrity, edema, etc.): Pts son present and agrees that pt can get OOB.  VSS.       Pertinent Vitals/Pain Pain Assessment: Faces Faces Pain Scale: Hurts even more Pain Location: distended stomach Pain Descriptors / Indicators: Grimacing;Guarding;Discomfort Pain Intervention(s): Limited activity within patient's tolerance;Monitored during session;Repositioned    Home Living                      Prior Function  PT Goals (current goals can now be found in the care plan section) Acute Rehab PT Goals Patient Stated Goal: to return home with son, decrease pain PT Goal Formulation: With patient/family Time For Goal Achievement: 10/22/18 Potential to Achieve Goals: Fair Progress towards PT goals: Progressing toward goals    Frequency    Min 3X/week      PT Plan Current plan remains appropriate;Frequency needs to be updated    Co-evaluation              AM-PAC PT "6 Clicks" Mobility   Outcome Measure  Help needed turning from your back to your side while in a flat bed without using bedrails?: A Lot Help needed moving from  lying on your back to sitting on the side of a flat bed without using bedrails?: A Lot Help needed moving to and from a bed to a chair (including a wheelchair)?: A Lot Help needed standing up from a chair using your arms (e.g., wheelchair or bedside chair)?: A Lot Help needed to walk in hospital room?: A Lot Help needed climbing 3-5 steps with a railing? : Total 6 Click Score: 11    End of Session Equipment Utilized During Treatment: Oxygen;Gait belt Activity Tolerance: Patient limited by fatigue Patient left: with call bell/phone within reach;with family/visitor present;in chair Nurse Communication: Mobility status PT Visit Diagnosis: Muscle weakness (generalized) (M62.81);Difficulty in walking, not elsewhere classified (R26.2);Other abnormalities of gait and mobility (R26.89);Pain;Unsteadiness on feet (R26.81) Pain - Right/Left: (NG tube discomfort) Pain - part of body: Leg(bottom, vaginal area, legs)     Time: 8727-6184 PT Time Calculation (min) (ACUTE ONLY): 19 min  Charges:  $Therapeutic Activity: 8-22 mins                     [place] Pager:  [phone]  Office:  [place] 10/08/2018, 1:16 PM

## 2018-10-08 NOTE — Progress Notes (Signed)
ANTICOAGULATION CONSULT NOTE  Pharmacy Consult:  Heparin Indication: atrial fibrillation  Allergies  Allergen Reactions  . Diclofenac Sodium Anaphylaxis  . Ibuprofen Anaphylaxis  . Lisinopril     Fall hazard, dizzy  . Adhesive [Tape]   . Codeine Nausea Only  . Tizanidine     "knocked out cold"    Patient Measurements: Height: 5\' 6"  (167.6 cm) Weight: 208 lb 5.4 oz (94.5 kg) IBW/kg (Calculated) : 59.3 Heparin Dosing Weight: 80 kg  Vital Signs: Temp: 98 F (36.7 C) (09/03 0820) Temp Source: Axillary (09/03 0820) BP: 148/70 (09/03 0820) Pulse Rate: 74 (09/03 0820)  Labs: Recent Labs    10/06/18 0526  10/07/18 0328 10/07/18 1045 10/07/18 1822 10/08/18 0243  HGB 12.6  --  11.4*  --   --   --   HCT 37.4  --  35.1*  --   --   --   PLT 311  --  345  --   --   --   HEPARINUNFRC 0.44  --  0.34  --   --  0.60  CREATININE 0.93   < > 0.76 0.91 0.65 0.66   < > = values in this interval not displayed.    Estimated Creatinine Clearance: 53.1 mL/min (by C-G formula based on SCr of 0.66 mg/dL).  Assessment: 6 YOF with new atrial fibrillation this admit. Received IV heparin 8/14>>8/18, then changed to Eliquis 5 mg PO BID. Pt now transitioned back to IV heparin with ileus and inability to take PO 8/20.  Heparin level is therapeutic today, CBC stable.     Goal of Therapy:  Heparin level 0.3-0.7 units/mL Monitor platelets by anticoagulation protocol: Yes   Plan:  -Continue heparin 1150 units/hr -Daily heparin level and CBC -F/U transitioning back to Eliquis   Anette Guarneri, PharmD Clinical Pharmacist [phone] Please check AMION for all [facility] numbers 10/08/2018

## 2018-10-08 NOTE — Progress Notes (Addendum)
  Subjective:  Pt seen at the bedside this AM on rounds. Says she is feeling a little better this AM. Discussed giving her more fluids over the course of today and the patient voiced understanding. Pt says she pooped a little bit on her last BM.   Objective:    Vital Signs (last 24 hours): Vitals:   10/07/18 1600 10/07/18 2000 10/08/18 0008 10/08/18 0420  BP: (!) 144/62 (!) 141/55 130/66 136/70  Pulse: 71 73 72 70  Resp: 13 18 15 14   Temp:  97.9 F (36.6 C) 98 F (36.7 C) 97.9 F (36.6 C)  TempSrc:  Oral Oral Oral  SpO2: 100% 99% 100% 99%  Weight:    94.5 kg  Height:         Physical Exam: General Awake and responds to questions appropriately  Cardiac Regular rate and rhythm, no murmurs, rubs, or gallops  Pulmonary Clear to auscultation bilaterally without wheezes, rhonchi, or rales  Abdominal Distended abdomen, feels softer than prior day. Generalized tenderness  Extremities No peripheral edema     Assessment/Plan:   Principal Problem:   Ileus (HCC) Active Problems:   Moderate aortic stenosis   Hyponatremia   Atrial fibrillation with RVR (HCC)   Pleural effusion   Pneumonia  Patient is a 85 year old female who was initially mated for A. fib with RVR and he remains hospitalized due to ileus.  # Ileus: KUB on 9/3 shows dilation of cecum, unchanged from prior. GI thinks may be 2/2 electrolyte abnormality K: 4.2, stable Phos: 2.3, repleting Mag: 2.1 Na: 122, increasing NS to 150 mL/hr, urine studies reviewed, will check AM cortisol to evaluate for adrenal insufficiency. However, Hyponatremia may be 2/2 hypovolemia. * Reglan 10 mg Q6HR IV, miralax 17 g BID, pyridostigmine 60 mg TID with dulcolax suppositories * NG tube in place on intermittent suction  # Atrial fibrillation with atrial flutter: Chads vas 5-6, echocardiogram on 8/14 with normal LVEF but moderate aortic stenosis.  Heart rate has been well controlled and maintained in the 70s to 80s, will continue with  IV treatment with diltiazem, heparin and PRN metoprolol untill resolution of ileus/toleration of PO.   Dispo: Anticipated discharge pending clinical improvement.   Jeanmarie Hubert, MD 10/08/2018, 7:49 AM Pager: [phone]

## 2018-10-08 NOTE — Progress Notes (Signed)
Progress Note  Patient Name: [name] Date of Encounter: 10/08/2018  Primary Cardiologist: Shelva Majestic, MD   Subjective   NG tube in place. Abdomen is distended but less discomfort  Inpatient Medications    Scheduled Meds: . bisacodyl  10 mg Rectal BID  . feeding supplement  1 Container Oral TID BM  . feeding supplement (PRO-STAT SUGAR FREE 64)  30 mL Oral BID  . metoCLOPramide (REGLAN) injection  10 mg Intravenous Q6H  . multivitamin with minerals  1 tablet Oral Daily  . polyethylene glycol  17 g Oral BID  . potassium & sodium phosphates  1 packet Oral TID WC & HS  . potassium chloride  20 mEq Oral BID  . pyridostigmine  60 mg Oral Q8H  . sodium chloride flush  3 mL Intravenous Q12H   Continuous Infusions: . sodium chloride 100 mL/hr at 10/08/18 0002  . diltiazem (CARDIZEM) infusion 15 mg/hr (10/07/18 2147)  . famotidine (PEPCID) IV 20 mg (10/07/18 2152)  . heparin 1,150 Units/hr (10/08/18 0008)   PRN Meds: acetaminophen **OR** acetaminophen, albuterol, capsaicin, ipratropium-albuterol, lip balm, magic mouthwash, metoprolol tartrate, metroNIDAZOLE, ondansetron **OR** ondansetron (ZOFRAN) IV, phenol, simethicone, sodium chloride flush   Vital Signs    Vitals:   10/07/18 1600 10/07/18 2000 10/08/18 0008 10/08/18 0420  BP: (!) 144/62 (!) 141/55 130/66 136/70  Pulse: 71 73 72 70  Resp: 13 18 15 14   Temp:  97.9 F (36.6 C) 98 F (36.7 C) 97.9 F (36.6 C)  TempSrc:  Oral Oral Oral  SpO2: 100% 99% 100% 99%  Weight:    94.5 kg  Height:        Intake/Output Summary (Last 24 hours) at 10/08/2018 0728 Last data filed at 10/08/2018 0435 Gross per 24 hour  Intake -  Output 1200 ml  Net -1200 ml   Last 3 Weights 10/08/2018 10/07/2018 10/06/2018  Weight (lbs) 208 lb 5.4 oz 207 lb 14.3 oz 202 lb 9.6 oz  Weight (kg) 94.5 kg 94.3 kg 91.9 kg      Telemetry    AFib with good rate control. HR in 60s - Personally Reviewed  ECG    No new tracing - Personally Reviewed   Physical Exam    GEN: NAD, NG tube in place Neck: No JVD Cardiac: irregular, SEM RUSB. No gallop.  Respiratory: Clear to auscultation bilaterally. GI: distended , reduced bowel sounds, tender MS: No edema; No deformity. Neuro:  Nonfocal  Psych: Normal affect   Labs    High Sensitivity Troponin:   Recent Labs  Lab 09/19/2018 1015 09/30/2018 1235 09/21/18 0407 09/21/18 0923 09/21/18 1107  TROPONINIHS 45* 48* 14 11 13       Chemistry Recent Labs  Lab 10/06/18 0526  10/07/18 1045 10/07/18 1822 10/08/18 0243  NA 122*   < > 124* 122* 123*  K 4.1   < > 4.7 4.5 4.2  CL 91*   < > 96* 95* 95*  CO2 22   < > 18* 18* 18*  GLUCOSE 108*   < > 111* 113* 109*  BUN 5*   < > 7* 5* 6*  CREATININE 0.93   < > 0.91 0.65 0.66  CALCIUM 8.4*   < > 8.4* 8.3* 7.9*  ALBUMIN 2.7*  --   --   --   --   GFRNONAA 54*   < > 55* >60 >60  GFRAA >60   < > >60 >60 >60  ANIONGAP 9   < > 10  9 10   < > = values in this interval not displayed.     Hematology Recent Labs  Lab 10/05/18 0315 10/06/18 0526 10/07/18 0328  WBC 11.0* 7.7 7.1  RBC 4.05 4.19 3.79*  HGB 12.1 12.6 11.4*  HCT 36.8 37.4 35.1*  MCV 90.9 89.3 92.6  MCH 29.9 30.1 30.1  MCHC 32.9 33.7 32.5  RDW 12.3 12.5 13.0  PLT 338 311 345    BNPNo results for input(s): BNP, PROBNP in the last 168 hours.   DDimer No results for input(s): DDIMER in the last 168 hours.   Radiology    Dg Abd 1 View  Result Date: 10/06/2018 CLINICAL DATA:  Abdominal pain and distention, ileus EXAM: ABDOMEN - 1 VIEW COMPARISON:  10/04/2018 FINDINGS: Colonic distention diffusely with marked gaseous distension of the cecum across the midline measuring up to 18.9 cm transverse. No significant small bowel distension. No bowel wall thickening. Bones demineralized with degenerative changes of lumbar spine. Prior LEFT femoral ORIF. No urinary tract calcifications. IMPRESSION: Significant gaseous distention of colon especially cecum which measures 18.9 cm transverse.  Findings called to [place] on 4E on 10/06/2018 at at 0902 hours. Electronically Signed   By: Lavonia Dana M.D.   On: 10/06/2018 09:04   Dg Abd Portable 1v  Result Date: 10/07/2018 CLINICAL DATA:  NG tube EXAM: PORTABLE ABDOMEN - 1 VIEW COMPARISON:  10/06/2018 FINDINGS: Esophageal tube tip overlies the gastroduodenal junction. Small bilateral pleural effusions and airspace disease at the left base. Dilated bowel in the upper abdomen. IMPRESSION: 1. Esophageal tube tip overlies the gastroduodenal junction 2. Tiny pleural effusions and basilar airspace disease on the left 3. Dilated bowel in the upper abdomen Electronically Signed   By: Donavan Foil M.D.   On: 10/07/2018 17:56    Cardiac Studies   09/18/2018 echo  1. The left ventricle has normal systolic function, with an ejection fraction of 55-60%. The cavity size was normal. There is moderately increased left ventricular wall thickness. Left ventricular diastolic function could not be evaluated secondary to  atrial fibrillation. 2. The right ventricle has normal systolic function. The cavity was normal. There is no increase in right ventricular wall thickness. Right ventricular systolic pressure could not be assessed. 3. The mitral valve is grossly normal. 4. The tricuspid valve is grossly normal. 5. The aortic valve was not well visualized. Moderate calcification of the aortic valve. Aortic valve regurgitation was not assessed by color flow Doppler. 6. Very poor visualization of the AV. Cannot assess number of cusps. There is thickening and calcificaiton of the AV. By doppler there is mild to moderate AS with man AVG 65mmHg and Vmax 276cm/s. 7. The aorta is normal unless otherwise noted. 8. The inferior vena cava was dilated in size with <50% respiratory variability. 9. The interatrial septum appears to be lipomatous. 10. Recommend repeat limited echo o f the AV.  Patient Profile     85 y.o. female with moderate aortic stenosis,  chronic diastolic heart failure, recent onset atrial fibrillation/atrial flutter, recurrent episodes of ileus.  Assessment & Plan    1.  Persistent atrial fibrillation: Well rate controlled on IV cardizem. Now on 15 mg/hr with persistent HR in 60s. I think we can reduce dose of IV diltiazem. I would be satisfied to keep HR <110.  Unable to transition to po rate control due to persistent ileus.  It would be better to consider a beta blocker when able to take po's. Also resume DOAC once has  normalization of intestinal function.   2. Chronic diastolic CHF: Her recurrent problems with symptoms of heart failure may be related to mobilization of third space fluids.  May require intermittent dosing with intravenous furosemide.  weight is fairly stable today. No increased dyspnea and oxygenating well.   3. AS: moderate. This has progressed compared to previous years but is still not severe.  she is not a  candidate for TAVR.   4. Hyponatremia: improved today to 123 and stable. Likely due to GI third spacing and SIADH  For questions or updates, please contact [place] Please consult [email or web address] for contact info under        Signed, Winfield Caba [place], MD  10/08/2018, 7:28 AM

## 2018-10-08 NOTE — Progress Notes (Signed)
Occupational Therapy Treatment Patient Details Name: [name] MRN: [id number] DOB: Dec 30, 1924 Today's Date: 10/08/2018    History of present illness 85 y.o. female with past medical history of hypertension, ileus, pericarditis admitted with atrial flutter with rapid ventricular response, increased dyspnea and chest pain.  Also with epigastric pain.  Echocardiogram shows normal LV function and moderate aortic stenosis.   OT comments  Limited participation secondary to pain and lethargy. Pt's son present and reports pt recently returned to bed.  Agreeable to having therapist wake pt to attempt to engage in UE exercises at bed level.  Pt aroused to stimulus but difficult to maintain arousal limiting participation in session.  Reiterated previously provided HEP and added horizontal abduction to increase participation in bed mobility and UB self-care tasks.  Pt able to participate minimally with increased effort.  Pt will continue to benefit from OT acutely to decrease burden of care.  Follow Up Recommendations  SNF;Home health OT;Supervision/Assistance - 24 hour    Equipment Recommendations  3 in 1 bedside commode       Precautions / Restrictions Precautions Precautions: Fall Restrictions Weight Bearing Restrictions: No       Mobility Bed Mobility Overal bed mobility: Needs Assistance Bed Mobility: Supine to Sit;Sit to Supine     Supine to sit: Min assist;+2 for safety/equipment;Mod assist     General bed mobility comments: Assist to elevate trunk.  Assist to move LES to initiate movement.   Transfers Overall transfer level: Needs assistance Equipment used: Rolling walker (2 wheeled) Transfers: Sit to/from [facility] Sit to Stand: +2 safety/equipment;Min assist Stand pivot transfers: Min assist;+2 safety/equipment       General transfer comment: Assist to power up.  Pt needed a few seconds to stand and get balance with min steadying assist and then was able  to pivot to chair.  Pt with oozing BM therefore cleaned her bottom.      Balance Overall balance assessment: Needs assistance Sitting-balance support: Feet supported Sitting balance-Leahy Scale: Good     Standing balance support: Bilateral upper extremity supported;During functional activity Standing balance-Leahy Scale: Poor Standing balance comment: relies on UE support                           ADL either performed or assessed with clinical judgement        Vision Baseline Vision/History: Macular Degeneration Patient Visual Report: No change from baseline            Cognition Arousal/Alertness: Lethargic Behavior During Therapy: WFL for tasks assessed/performed Overall Cognitive Status: Within Functional Limits for tasks assessed                                 General Comments: appears WFL, limited assessment due to lethargy        Exercises Other Exercises Other Exercises: Engaged in overall shoulder flexion, elbow flexion, and horizontal abduction      General Comments Pts son present and agrees that pt can get OOB.  VSS.     Pertinent Vitals/ Pain       Pain Assessment: Faces Faces Pain Scale: Hurts even more Pain Location: distended stomach Pain Descriptors / Indicators: Grimacing;Guarding;Discomfort Pain Intervention(s): Limited activity within patient's tolerance;Monitored during session         Frequency  Min 2X/week        Progress Toward Goals  OT Goals(current  goals can now be found in the care plan section)  Progress towards OT goals: Not progressing toward goals - comment(mobility limited d/t pain and lethargy)  Acute Rehab OT Goals Patient Stated Goal: to return home with son, decrease pain Time For Goal Achievement: 10/15/18 Potential to Achieve Goals: Good  Plan Discharge plan remains appropriate;Frequency remains appropriate       AM-PAC OT "6 Clicks" Daily Activity     Outcome Measure   Help from  another person eating meals?: A Little Help from another person taking care of personal grooming?: A Lot Help from another person toileting, which includes using toliet, bedpan, or urinal?: A Lot Help from another person bathing (including washing, rinsing, drying)?: A Lot Help from another person to put on and taking off regular upper body clothing?: A Lot Help from another person to put on and taking off regular lower body clothing?: Total 6 Click Score: 12    End of Session Equipment Utilized During Treatment: Oxygen  OT Visit Diagnosis: Other abnormalities of gait and mobility (R26.89);History of falling (Z91.81);Pain;Feeding difficulties (R63.3) Pain - part of body: (abdomen)   Activity Tolerance Patient limited by lethargy   Patient Left in bed;with call bell/phone within reach;with bed alarm set;with family/visitor present   Nurse Communication Mobility status        Time: [id number] OT Time Calculation (min): 15 min  Charges: OT General Charges $OT Visit: 1 Visit OT Treatments $Therapeutic Exercise: 8-22 mins    Simonne Come [phone] 10/08/2018, 2:12 PM

## 2018-10-09 DIAGNOSIS — E8809 Other disorders of plasma-protein metabolism, not elsewhere classified: Secondary | ICD-10-CM

## 2018-10-09 LAB — CBC
HCT: 37 % (ref 36.0–46.0)
Hemoglobin: 12 g/dL (ref 12.0–15.0)
MCH: 29.9 pg (ref 26.0–34.0)
MCHC: 32.4 g/dL (ref 30.0–36.0)
MCV: 92.3 fL (ref 80.0–100.0)
Platelets: 302 10*3/uL (ref 150–400)
RBC: 4.01 MIL/uL (ref 3.87–5.11)
RDW: 12.9 % (ref 11.5–15.5)
WBC: 5.7 10*3/uL (ref 4.0–10.5)
nRBC: 0 % (ref 0.0–0.2)

## 2018-10-09 LAB — BASIC METABOLIC PANEL
Anion gap: 8 (ref 5–15)
Anion gap: 9 (ref 5–15)
BUN: <5 mg/dL — ABNORMAL LOW (ref 8–23)
BUN: <5 mg/dL — ABNORMAL LOW (ref 8–23)
CO2: 21 mmol/L — ABNORMAL LOW (ref 22–32)
CO2: 22 mmol/L (ref 22–32)
Calcium: 8 mg/dL — ABNORMAL LOW (ref 8.9–10.3)
Calcium: 8.1 mg/dL — ABNORMAL LOW (ref 8.9–10.3)
Chloride: 97 mmol/L — ABNORMAL LOW (ref 98–111)
Chloride: 96 mmol/L — ABNORMAL LOW (ref 98–111)
Creatinine, Ser: 0.53 mg/dL (ref 0.44–1.00)
Creatinine, Ser: 0.64 mg/dL (ref 0.44–1.00)
GFR calc Af Amer: >60 mL/min (ref >60)
GFR calc Af Amer: >60 mL/min (ref >60)
GFR calc non Af Amer: >60 mL/min (ref >60)
GFR calc non Af Amer: >60 mL/min (ref >60)
Glucose, Bld: 118 mg/dL — ABNORMAL HIGH (ref 70–99)
Glucose, Bld: 106 mg/dL — ABNORMAL HIGH (ref 70–99)
Potassium: 4.1 mmol/L (ref 3.5–5.1)
Potassium: 3.9 mmol/L (ref 3.5–5.1)
Sodium: 126 mmol/L — ABNORMAL LOW (ref 135–145)
Sodium: 127 mmol/L — ABNORMAL LOW (ref 135–145)

## 2018-10-09 LAB — MAGNESIUM: Magnesium: 1.8 mg/dL (ref 1.7–2.4)

## 2018-10-09 LAB — HEPARIN LEVEL (UNFRACTIONATED): Heparin Unfractionated: 0.73 IU/mL — ABNORMAL HIGH (ref 0.30–0.70)

## 2018-10-09 LAB — CORTISOL-AM, BLOOD: Cortisol - AM: 15.5 ug/dL (ref 6.7–22.6)

## 2018-10-09 MED ORDER — POLYETHYLENE GLYCOL 3350 17 G PO PACK
17.0000 g | PACK | Freq: Two times a day (BID) | ORAL | Status: DC
  Administered 2018-10-10 – 2018-10-12 (×5): 17 g via NASOGASTRIC
  Filled 2018-10-09 (×6): qty 1

## 2018-10-09 MED ORDER — PYRIDOSTIGMINE BROMIDE 60 MG PO TABS
60.0000 mg | ORAL_TABLET | Freq: Three times a day (TID) | ORAL | Status: DC
  Filled 2018-10-09: qty 1

## 2018-10-09 MED ORDER — ADULT MULTIVITAMIN W/MINERALS CH
1.0000 | ORAL_TABLET | Freq: Every day | ORAL | Status: DC
  Administered 2018-10-10: 10:00:00 1
  Filled 2018-10-09: qty 1

## 2018-10-09 MED ORDER — SIMETHICONE 80 MG PO CHEW: 80.0000 mg | CHEWABLE_TABLET | Freq: Four times a day (QID) | ORAL | Status: DC | PRN

## 2018-10-09 MED ORDER — MAGNESIUM SULFATE 50 % IJ SOLN
3.0000 g | Freq: Once | INTRAVENOUS | Status: AC
  Administered 2018-10-09: 3 g via INTRAVENOUS
  Filled 2018-10-09: qty 6

## 2018-10-09 MED ORDER — PYRIDOSTIGMINE BROMIDE 60 MG PO TABS
90.0000 mg | ORAL_TABLET | Freq: Three times a day (TID) | ORAL | Status: DC
  Administered 2018-10-09 – 2018-10-12 (×10): 90 mg via NASOGASTRIC
  Filled 2018-10-09 (×12): qty 1.5

## 2018-10-09 NOTE — Care Management Important Message (Signed)
Important Message  Patient Details  Name: [name] MRN: [id number] Date of Birth: 09-22-1924   Medicare Important Message Given:  Yes     Memory Argue 10/09/2018, 2:58 PM

## 2018-10-09 NOTE — Progress Notes (Signed)
Progress Note  Patient Name: [name] Date of Encounter: 10/09/2018  Primary Cardiologist: Shelva Majestic, MD   Subjective   NG tube in place. Abdomen is distended but less discomfort  Inpatient Medications    Scheduled Meds: . bisacodyl  10 mg Rectal BID  . feeding supplement  1 Container Oral TID BM  . feeding supplement (PRO-STAT SUGAR FREE 64)  30 mL Oral BID  . metoCLOPramide (REGLAN) injection  10 mg Intravenous Q6H  . multivitamin with minerals  1 tablet Oral Daily  . polyethylene glycol  17 g Oral BID  . pyridostigmine  60 mg Oral Q8H  . sodium chloride flush  3 mL Intravenous Q12H   Continuous Infusions: . sodium chloride 100 mL/hr at 10/08/18 2302  . diltiazem (CARDIZEM) infusion 5 mg/hr (10/08/18 0844)  . famotidine (PEPCID) IV 20 mg (10/08/18 2310)  . heparin 1,050 Units/hr (10/09/18 0612)  . magnesium sulfate bolus IVPB     PRN Meds: acetaminophen **OR** acetaminophen, albuterol, capsaicin, ipratropium-albuterol, lip balm, magic mouthwash, metoprolol tartrate, metroNIDAZOLE, ondansetron **OR** ondansetron (ZOFRAN) IV, phenol, simethicone, sodium chloride flush   Vital Signs    Vitals:   10/08/18 1110 10/08/18 1959 10/09/18 0024 10/09/18 0500  BP: 139/76 (!) 156/61 (!) 170/67   Pulse: 75 76 81   Resp: (!) 21 15 13    Temp: (!) 97.5 F (36.4 C) (!) 97.4 F (36.3 C) (!) 97.2 F (36.2 C)   TempSrc: Axillary Oral Axillary   SpO2: 100% 93% 98%   Weight:    99 kg  Height:        Intake/Output Summary (Last 24 hours) at 10/09/2018 0749 Last data filed at 10/09/2018 0612 Gross per 24 hour  Intake -  Output 1600 ml  Net -1600 ml   Last 3 Weights 10/09/2018 10/08/2018 10/07/2018  Weight (lbs) 218 lb 4.1 oz 208 lb 5.4 oz 207 lb 14.3 oz  Weight (kg) 99 kg 94.5 kg 94.3 kg      Telemetry    AFib with good rate control. HR in 90s - Personally Reviewed  ECG    No new tracing - Personally Reviewed  Physical Exam    GEN: NAD, NG tube in place Neck: No JVD  Cardiac: irregular, SEM RUSB. No gallop.  Respiratory: Clear to auscultation bilaterally. GI: distended , reduced bowel sounds, tender MS: No edema; No deformity. Neuro:  Nonfocal  Psych: Normal affect   Labs    High Sensitivity Troponin:   Recent Labs  Lab 09/30/2018 1015 09/20/2018 1235 09/21/18 0407 09/21/18 0923 09/21/18 1107  TROPONINIHS 45* 48* 14 11 13       Chemistry Recent Labs  Lab 10/06/18 0526  10/08/18 0243 10/08/18 1540 10/09/18 0245  NA 122*   < > 123* 124* 126*  K 4.1   < > 4.2 4.3 4.1  CL 91*   < > 95* 95* 97*  CO2 22   < > 18* 19* 21*  GLUCOSE 108*   < > 109* 110* 118*  BUN 5*   < > 6* <5* <5*  CREATININE 0.93   < > 0.66 0.64 0.53  CALCIUM 8.4*   < > 7.9* 8.3* 8.0*  ALBUMIN 2.7*  --   --   --   --   GFRNONAA 54*   < > >60 >60 >60  GFRAA >60   < > >60 >60 >60  ANIONGAP 9   < > 10 10 8    < > = values in this interval  not displayed.     Hematology Recent Labs  Lab 10/06/18 0526 10/07/18 0328 10/09/18 0245  WBC 7.7 7.1 5.7  RBC 4.19 3.79* 4.01  HGB 12.6 11.4* 12.0  HCT 37.4 35.1* 37.0  MCV 89.3 92.6 92.3  MCH 30.1 30.1 29.9  MCHC 33.7 32.5 32.4  RDW 12.5 13.0 12.9  PLT 311 345 302    BNPNo results for input(s): BNP, PROBNP in the last 168 hours.   DDimer No results for input(s): DDIMER in the last 168 hours.   Radiology    Dg Abd 1 View  Result Date: 10/08/2018 CLINICAL DATA:  Ileus. EXAM: ABDOMEN - 1 VIEW COMPARISON:  Abdominal x-ray from yesterday. FINDINGS: Unchanged enteric tube tip near the gastroduodenal junction. Unchanged marked gaseous distention of the cecum measuring up to 18 cm, unchanged. No significant small bowel dilatation. No acute osseous abnormality. Unchanged small left pleural effusion. IMPRESSION: 1. Unchanged prominent gaseous distention of the cecum. Electronically Signed   By: Titus Dubin M.D.   On: 10/08/2018 08:55   Dg Abd Portable 1v  Result Date: 10/07/2018 CLINICAL DATA:  NG tube EXAM: PORTABLE ABDOMEN -  1 VIEW COMPARISON:  10/06/2018 FINDINGS: Esophageal tube tip overlies the gastroduodenal junction. Small bilateral pleural effusions and airspace disease at the left base. Dilated bowel in the upper abdomen. IMPRESSION: 1. Esophageal tube tip overlies the gastroduodenal junction 2. Tiny pleural effusions and basilar airspace disease on the left 3. Dilated bowel in the upper abdomen Electronically Signed   By: Donavan Foil M.D.   On: 10/07/2018 17:56    Cardiac Studies   09/18/2018 echo  1. The left ventricle has normal systolic function, with an ejection fraction of 55-60%. The cavity size was normal. There is moderately increased left ventricular wall thickness. Left ventricular diastolic function could not be evaluated secondary to  atrial fibrillation. 2. The right ventricle has normal systolic function. The cavity was normal. There is no increase in right ventricular wall thickness. Right ventricular systolic pressure could not be assessed. 3. The mitral valve is grossly normal. 4. The tricuspid valve is grossly normal. 5. The aortic valve was not well visualized. Moderate calcification of the aortic valve. Aortic valve regurgitation was not assessed by color flow Doppler. 6. Very poor visualization of the AV. Cannot assess number of cusps. There is thickening and calcificaiton of the AV. By doppler there is mild to moderate AS with man AVG 83mmHg and Vmax 276cm/s. 7. The aorta is normal unless otherwise noted. 8. The inferior vena cava was dilated in size with <50% respiratory variability. 9. The interatrial septum appears to be lipomatous. 10. Recommend repeat limited echo o f the AV.  Patient Profile     85 y.o. female with moderate aortic stenosis, chronic diastolic heart failure, recent onset atrial fibrillation/atrial flutter, recurrent episodes of ileus.  Assessment & Plan    1.  Persistent atrial fibrillation: Well rate controlled now completely off any rate control  therapy. IV diltiazem discontinued yesterday.  Ileus persists. Remains on IV heparin for anticoagulation. Consider transition to po DOAC when/if bowel function improves.   2. Chronic diastolic CHF: Her recurrent problems with symptoms of heart failure may be related to mobilization of third space fluids.  May require intermittent dosing with intravenous furosemide. No increased dyspnea and oxygenating well.   3. AS: moderate. This has progressed compared to previous years but is still not severe.  she is not a  candidate for TAVR.   4. Hyponatremia: improved today to  126 and stable. Likely due to GI third spacing and SIADH  At this point we have nothing further to add from a cardiac standpoint and will sign off. Prognosis is poor.   [facility] will sign off.   Medication Recommendations:  Per [facility] Other recommendations (labs, testing, etc):  none Follow up as an outpatient:  Prn with Dr Claiborne Billings   For questions or updates, please contact [facility] Please consult [email or web address] for contact info under        Signed, Jerrico Covello [place], MD  10/09/2018, 7:49 AM

## 2018-10-09 NOTE — Progress Notes (Addendum)
    Progress Note   Subjective  Not feeling well. More abdominal pain. She denies any significant stool output. No nausea or vomiting. Persistent shortness of breath and cough. Tolerating NG tube.  No family present at the time of my evaluation. Clear liquid tray at the bedside.    Objective  Vital signs in last 24 hours: Temp:  [97.2 F (36.2 C)-97.5 F (36.4 C)] 97.2 F (36.2 C) (09/04 0024) Pulse Rate:  [75-81] 81 (09/04 0024) Resp:  [13-21] 13 (09/04 0024) BP: (139-170)/(61-76) 170/67 (09/04 0024) SpO2:  [93 %-100 %] 98 % (09/04 0024) Weight:  [99 kg] 99 kg (09/04 0500) Last BM Date: 10/07/18  General: Alert, elderly, well-developed, less comfortable today, NG tube present Abdomen:  Soft, protuberent. More distended. More tympany. No abdominal pain.  No bowel sounds. No rebound or guarding.  Extremities:  Without edema. Neurologic:  Alert and  oriented x4; grossly normal neurologically. Psych:  Alert and cooperative. Normal mood and affect.  Intake/Output from previous day: 09/03 0701 - 09/04 0700 In: -  Out: 1600 [Urine:1300; Emesis/NG output:300] Intake/Output this shift: No intake/output data recorded.  Lab Results: Recent Labs    10/07/18 0328 10/09/18 0245  WBC 7.1 5.7  HGB 11.4* 12.0  HCT 35.1* 37.0  PLT 345 302   BMET Recent Labs    10/08/18 0243 10/08/18 1540 10/09/18 0245  NA 123* 124* 126*  K 4.2 4.3 4.1  CL 95* 95* 97*  CO2 18* 19* 21*  GLUCOSE 109* 110* 118*  BUN 6* <5* <5*  CREATININE 0.66 0.64 0.53  CALCIUM 7.9* 8.3* 8.0*    Studies/Results: No new abdominal films today.    Assessment & Recommendations   Peristent ileus in the setting of chronic colon distension and constipation worsened by electrolyte disturbances. No significant change in bowel function since starting pyridostigmine and Dulcolax suppositories.   Correction of her hyponatremia underway. Continue NG tube. Clear liquid tray limits the ability to interpret NG tube  output.  Increase pyridostigmine to 90 mg TID in combination with standing Dulcolax suppositories BID. Continue IV Reglan.  Consider cross-sectional imaging to exclude obstruction if no change in her symptoms despite higher dose of pyridostigmine.    I was unable to reach her son, Fraser Din, by phone. I left an extensive message at [phone].     LOS: 22 days   Thornton Park MD 10/09/2018, 11:09 AM

## 2018-10-09 NOTE — Progress Notes (Signed)
Nutrition Follow-up  DOCUMENTATION CODES:   Not applicable  INTERVENTION:   Recommend initiation of TPN or goals of care discussion as pt is day 22 without adequate nutrition.    Continue Boost Breeze po TID, each supplement provides 250 kcal and 9 grams of protein  Continue 30 ml Prostat BID, each supplement provides 100 kcals and 15 grams protein.   NUTRITION DIAGNOSIS:   Increased nutrient needs related to acute illness as evidenced by estimated needs.  Ongoing  GOAL:   Patient will meet greater than or equal to 90% of their needs  Not meeting  MONITOR:   PO intake, Supplement acceptance, Diet advancement, Skin, Weight trends, Labs, I & O's  REASON FOR ASSESSMENT:   NPO/Clear Liquid Diet    ASSESSMENT:   Patient with PMH significant for HTN, HLD, and previous SBO. Presents this admission with colonic ileus.   8/24- NGT placed  Abdomen remains distended. NGT to suction (clamped for 30 minutes after medication administration). With NGT to suction most of the day pt likely not absorbing anything PO. Spoke with internal medicine regarding initiation of TPN as pt is day 22 without adequate nutrition and hyponatremia has worsened. They are to speak with family.     Admission weight: 95.7 kg Current weight: 99 kg   I/O: +713 ml since 8/1 UOP: 1,300 ml x 24 hrs   Drips: NS @ 150 ml/hr  Medications: dulcolax, 10 mg reglan QID, MVI with minerals, miralax, mestinon Labs: Na 126 (L) Phosphorus 2.3 (L)   NUTRITION - FOCUSED PHYSICAL EXAM:    Most Recent Value  Orbital Region  No depletion  Upper Arm Region  No depletion  Thoracic and Lumbar Region  Unable to assess  Buccal Region  No depletion  Temple Region  No depletion  Clavicle Bone Region  No depletion  Clavicle and Acromion Bone Region  No depletion  Scapular Bone Region  Unable to assess  Dorsal Hand  No depletion  Patellar Region  No depletion  Anterior Thigh Region  No depletion  Posterior Calf  Region  No depletion  Edema (RD Assessment)  Mild  Hair  Reviewed  Eyes  Reviewed  Mouth  Reviewed  Skin  Reviewed  Nails  Reviewed     Diet Order:   Diet Order            Diet clear liquid Room service appropriate? No; Fluid consistency: Thin  Diet effective now              EDUCATION NEEDS:   Not appropriate for education at this time  Skin:  Skin Assessment: Skin Integrity Issues: Skin Integrity Issues:: Other (Comment) Other: MASD- buttocks, nose  Last BM:  9/2  Height:   Ht Readings from Last 1 Encounters:  09/21/2018 5\' 6"  (1.676 m)    Weight:   Wt Readings from Last 1 Encounters:  10/09/18 99 kg    Ideal Body Weight:  59.1 kg  BMI:  Body mass index is 35.23 kg/m.  Estimated Nutritional Needs:   Kcal:  1700-1900 kcal  Protein:  85-100 grams  Fluid:  >/= 1.7 L/day   Mariana Single RD, LDN Clinical Nutrition Pager # - [phone]

## 2018-10-09 NOTE — Progress Notes (Addendum)
ANTICOAGULATION CONSULT NOTE  Pharmacy Consult:  Heparin Indication: atrial fibrillation  Allergies  Allergen Reactions  . Diclofenac Sodium Anaphylaxis  . Ibuprofen Anaphylaxis  . Lisinopril     Fall hazard, dizzy  . Adhesive [Tape]   . Codeine Nausea Only  . Tizanidine     "knocked out cold"    Patient Measurements: Height: 5\' 6"  (167.6 cm) Weight: 208 lb 5.4 oz (94.5 kg) IBW/kg (Calculated) : 59.3 Heparin Dosing Weight: 80 kg  Vital Signs: Temp: 97.2 F (36.2 C) (09/04 0024) Temp Source: Axillary (09/04 0024) BP: 170/67 (09/04 0024) Pulse Rate: 81 (09/04 0024)  Labs: Recent Labs    10/07/18 0328  10/08/18 0243 10/08/18 1540 10/09/18 0245  HGB 11.4*  --   --   --  12.0  HCT 35.1*  --   --   --  37.0  PLT 345  --   --   --  302  HEPARINUNFRC 0.34  --  0.60  --  0.73*  CREATININE 0.76   < > 0.66 0.64 0.53   < > = values in this interval not displayed.    Estimated Creatinine Clearance: 53.1 mL/min (by C-G formula based on SCr of 0.53 mg/dL).  Assessment: 31 YOF with new atrial fibrillation this admit. Received IV heparin 8/14>>8/18, then changed to Eliquis 5 mg PO BID. Pt now transitioned back to IV heparin with ileus and inability to take PO on 8/20.  Heparin level of 0.73 is supratherapeutic today. CBC stable. No reported bleeding per nursing.     Goal of Therapy:  Heparin level 0.3-0.7 units/mL Monitor platelets by anticoagulation protocol: Yes   Plan:  -Decrease heparin to 1050 units/hr -Daily heparin level and CBC -F/U transitioning back to Eliquis  Thank you Anette Guarneri, PharmD [phone]  10/09/2018

## 2018-10-09 NOTE — Progress Notes (Signed)
  Subjective:  Pt seen at the bedside this AM on rounds today. Son is present today The patient says she doesn't feel well today. She feels about the same as yesterday. She says she just wants to feel better. Stomach feels swollen and painful.  Her son says she can't tolerate anything PO. Everything she tries to put something down, it comes back up. Pt asks why she is wheezing. The team explained that she has inflammation in her upper airways causing her wheeze.    Objective:    Vital Signs (last 24 hours): Vitals:   10/08/18 1959 10/09/18 0024 10/09/18 0500 10/09/18 1233  BP: (!) 156/61 (!) 170/67    Pulse: 76 81    Resp: 15 13    Temp: (!) 97.4 F (36.3 C) (!) 97.2 F (36.2 C)    TempSrc: Oral Axillary    SpO2: 93% 98%  100%  Weight:   99 kg   Height:        Physical Exam: General Awake and answers questions appropriately, uncomfortable appearing  Cardiac Regular rate and rhythm, no murmurs, rubs, or gallops  Pulmonary Expiratory wheezes in bilateral upper airways. No rales or rhonchi  Abdominal Distended abdomen, bowel sounds present  Extremities Mild pitting edema below the ankles bilaterally. Decreased skin turgor in bilateral extremities      Assessment/Plan:   Principal Problem:   Ileus (HCC) Active Problems:   Moderate aortic stenosis   Hyponatremia   Atrial fibrillation with RVR (HCC)   Pleural effusion   Pneumonia  Patient is a 85 year old female who was initially admitted for atrial fibrillation with RVR and remains hospitalized due to ileus.  # Ileus: KUB on 9/3 showed dilation of cecum, unchanged from prior.  GI thinks may be secondary to electrolyte abnormality.  Potassium, phosphate, mag have been repleted as necessary.  Hyponatremia has been challenging to correct.  Patient was on increased rate of normal saline at 150 mL/hr yesterday which was decreased back to 100 mL/hr yesterday due to concern for volume overload. * Will increase NS to 150 mL/hr.  Patient appears volume depleted despite some lower extremity edema. Will re-evaluate volume status * NG tube in place on intermittent suction * Patient unable to tolerate oral medications. Will deliver medications via NG tube where possible. * Been on pyridostigmine trial but unable to take yesterday by mouth. Will deliver via NG tube today  # Atrial fibrillation with atrial flutter: We will continue IV treatment with diltiazem, heparin and as needed metoprolol.  Dispo: Anticipated discharge pending clinical improvement  Jeanmarie Hubert, MD 10/09/2018, 1:25 PM Pager: [phone]

## 2018-10-10 ENCOUNTER — Inpatient Hospital Stay (HOSPITAL_COMMUNITY): Payer: Medicare Other

## 2018-10-10 ENCOUNTER — Inpatient Hospital Stay: Payer: Self-pay

## 2018-10-10 LAB — HEMOGLOBIN A1C
Hgb A1c MFr Bld: 5.6 % (ref 4.8–5.6)
Mean Plasma Glucose: 114.02 mg/dL

## 2018-10-10 LAB — CBC
HCT: 37.4 % (ref 36.0–46.0)
Hemoglobin: 12.1 g/dL (ref 12.0–15.0)
MCH: 29.8 pg (ref 26.0–34.0)
MCHC: 32.4 g/dL (ref 30.0–36.0)
MCV: 92.1 fL (ref 80.0–100.0)
Platelets: 307 10*3/uL (ref 150–400)
RBC: 4.06 MIL/uL (ref 3.87–5.11)
RDW: 12.7 % (ref 11.5–15.5)
WBC: 8 10*3/uL (ref 4.0–10.5)
nRBC: 0 % (ref 0.0–0.2)

## 2018-10-10 LAB — BASIC METABOLIC PANEL
Anion gap: 11 (ref 5–15)
Anion gap: 7 (ref 5–15)
BUN: <5 mg/dL — ABNORMAL LOW (ref 8–23)
BUN: <5 mg/dL — ABNORMAL LOW (ref 8–23)
CO2: 20 mmol/L — ABNORMAL LOW (ref 22–32)
CO2: 20 mmol/L — ABNORMAL LOW (ref 22–32)
Calcium: 8.3 mg/dL — ABNORMAL LOW (ref 8.9–10.3)
Calcium: 7 mg/dL — ABNORMAL LOW (ref 8.9–10.3)
Chloride: 96 mmol/L — ABNORMAL LOW (ref 98–111)
Chloride: 103 mmol/L (ref 98–111)
Creatinine, Ser: 0.62 mg/dL (ref 0.44–1.00)
Creatinine, Ser: 0.52 mg/dL (ref 0.44–1.00)
GFR calc Af Amer: >60 mL/min (ref >60)
GFR calc Af Amer: >60 mL/min (ref >60)
GFR calc non Af Amer: >60 mL/min (ref >60)
GFR calc non Af Amer: >60 mL/min (ref >60)
Glucose, Bld: 116 mg/dL — ABNORMAL HIGH (ref 70–99)
Glucose, Bld: 99 mg/dL (ref 70–99)
Potassium: 4 mmol/L (ref 3.5–5.1)
Potassium: 3.1 mmol/L — ABNORMAL LOW (ref 3.5–5.1)
Sodium: 127 mmol/L — ABNORMAL LOW (ref 135–145)
Sodium: 130 mmol/L — ABNORMAL LOW (ref 135–145)

## 2018-10-10 LAB — GLUCOSE, CAPILLARY
Glucose-Capillary: 109 mg/dL — ABNORMAL HIGH (ref 70–99)
Glucose-Capillary: 96 mg/dL (ref 70–99)
Glucose-Capillary: 125 mg/dL — ABNORMAL HIGH (ref 70–99)

## 2018-10-10 LAB — HEPARIN LEVEL (UNFRACTIONATED): Heparin Unfractionated: 0.49 IU/mL (ref 0.30–0.70)

## 2018-10-10 LAB — MAGNESIUM: Magnesium: 1.8 mg/dL (ref 1.7–2.4)

## 2018-10-10 MED ORDER — INSULIN ASPART 100 UNIT/ML ~~LOC~~ SOLN
0.0000 [IU] | SUBCUTANEOUS | Status: DC
  Administered 2018-10-10 – 2018-10-11 (×6): 1 [IU] via SUBCUTANEOUS
  Administered 2018-10-12: 11:00:00 2 [IU] via SUBCUTANEOUS
  Administered 2018-10-12 (×3): 1 [IU] via SUBCUTANEOUS

## 2018-10-10 MED ORDER — MAGNESIUM SULFATE 4 GM/100ML IV SOLN
4.0000 g | Freq: Once | INTRAVENOUS | Status: AC
  Administered 2018-10-10: 15:00:00 4 g via INTRAVENOUS
  Filled 2018-10-10: qty 100

## 2018-10-10 MED ORDER — SODIUM CHLORIDE 0.9% FLUSH: 10.0000 mL | INTRAVENOUS | Status: DC | PRN

## 2018-10-10 MED ORDER — SODIUM CHLORIDE 0.9 % IV SOLN
INTRAVENOUS | Status: DC
  Administered 2018-10-10 – 2018-10-12 (×2): via INTRAVENOUS

## 2018-10-10 MED ORDER — SODIUM CHLORIDE 0.9% FLUSH
10.0000 mL | Freq: Two times a day (BID) | INTRAVENOUS | Status: DC
  Administered 2018-10-10 – 2018-10-11 (×2): 10 mL

## 2018-10-10 MED ORDER — TRAVASOL 10 % IV SOLN
INTRAVENOUS | Status: AC
  Administered 2018-10-10: 18:00:00 via INTRAVENOUS
  Filled 2018-10-10: qty 480

## 2018-10-10 MED ORDER — DILTIAZEM HCL-DEXTROSE 100-5 MG/100ML-% IV SOLN (PREMIX)
5.0000 mg/h | INTRAVENOUS | Status: DC
  Administered 2018-10-10 – 2018-10-12 (×4): 5 mg/h via INTRAVENOUS
  Filled 2018-10-10 (×4): qty 100

## 2018-10-10 NOTE — Progress Notes (Signed)
Peripherally Inserted Central Catheter/Midline Placement  The IV Nurse has discussed with the patient and/or persons authorized to consent for the patient, the purpose of this procedure and the potential benefits and risks involved with this procedure.  The benefits include less needle sticks, lab draws from the catheter, and the patient may be discharged home with the catheter. Risks include, but not limited to, infection, bleeding, blood clot (thrombus formation), and puncture of an artery; nerve damage and irregular heartbeat and possibility to perform a PICC exchange if needed/ordered by physician.  Alternatives to this procedure were also discussed.  Bard Power PICC patient education guide, fact sheet on infection prevention and patient information card has been provided to patient /or left at bedside.  Consent signed by son Saralyn Pilar.  PICC/Midline Placement Documentation  PICC Double Lumen 123456 PICC Right Basilic 38 cm 0 cm (Active)  Indication for Insertion or Continuance of Line Administration of hyperosmolar/irritating solutions (i.e. TPN, Vancomycin, etc.) 10/10/18 1409  Exposed Catheter (cm) 0 cm 10/10/18 1409  Site Assessment Clean;Dry;Intact 10/10/18 1409  Lumen #1 Status Flushed;Saline locked;Blood return noted 10/10/18 1409  Lumen #2 Status Flushed;Saline locked;No blood return 10/10/18 1409  Dressing Type Transparent 10/10/18 1409  Dressing Status Clean;Dry;Intact;Antimicrobial disc in place 10/10/18 1409  Dressing Change Due 10/17/18 10/10/18 1409       Gordan Payment 10/10/2018, 2:11 PM

## 2018-10-10 NOTE — Progress Notes (Signed)
Paged for patient having acute shortness of breath and tachycardia. On visiting room, she was on NRB for shortness of breath. Blood pressure elevated [id number] systolic, O2 [id number], HR 123456. Patient alert and oriented and denies chest pain or other pain, just SOB. ECG done showed Afib with RVR. IV metop given which decreased rate to 120s. Weaned back to 2L which she has been on through much of her stay. Will restart diltiazem gtt for now and reassess HR.   Marty Heck, DO 10/10/2018, 5:02 AM Pager: [phone]

## 2018-10-10 NOTE — Progress Notes (Signed)
   Subjective: Patient was lying in her bed today upon entering the room.  She continues to endorse moderate discomfort and says that she feels possibly better today.  Per nursing staff there reported to moderately sized bowel movements.  We discussed indication for advanced nutrition options given the patient's n.p.o. status and prolonged ileus.  She is in agreement with the plan of proceeding with total parenteral nutrition and understands that a larger IV line and a PICC line will need to be placed.  She was briefly counseled on the risks associated with TPN and PICC line placement which including but not limited to infection or perforation.  She agreed to proceed  Objective:  Vital signs in last 24 hours: Vitals:   10/09/18 2000 10/09/18 2359 10/10/18 0500 10/10/18 0635  BP: (!) 154/57   132/71  Pulse: 80   75  Resp: 13 (!) 25  14  Temp: 97.8 F (36.6 C)   97.8 F (36.6 C)  TempSrc: Oral   Oral  SpO2: 96%   99%  Weight:   99.7 kg   Height:       General: A/O x4, in no acute distress but appears to be in pain, afebrile, nondiaphoretic Cardio: Irregularly irregular, no mrg's  Pulmonary: CTA bilaterally, minor upper airway wheezing no appreciable crackles  Abdomen: Moderately distended but slightly improved from the prior day, slightly less tympanic MSK: BLE tender and with +1 edema  Psych: Appropriate affect, not depressed in appearance, engages well  Assessment/Plan:  Principal Problem:   Ileus ([place]) Active Problems:   Moderate aortic stenosis   Hyponatremia   Atrial fibrillation with RVR (HCC)   Pleural effusion   Pneumonia  Patient is a 85 year old female who was initially admitted for atrial fibrillation with RVR found to a have colonic ileus. She has remain hospitalized due to a prolonged, recurrent colonic ileus.  Gastroneurology has been guiding therapy and is assisting with managing the patient but unfortunately there is been minimal improvement to date despite  advancing therapy.  A/P: Prolonged colonic ileus: Patient continues to have marked dilation of the cecum which is unchanged on imaging on 9/3.  GI believes this is secondary to electrolyte abnormalities.  In light of the patient's improvement today and desire to proceed with aggressive therapy we will initiate TPN protocol and request PICC line placement.  Patient has been without adequate nutrition for prolonged period of time due to her colonic ileus and various treatment methods. -PICC line order placed -TPN ordered following PICC placement -Contacted son via number listed in chart, there is no answer -Continue to replace electrolytes to the target of greater than 2 for magnesium and 4 for potassium -Continue normal saline 150 mL's an hour to maintain sodium of repletion - Continue pyridostigmine 90 mg per tube every 8 hours -Appreciate GIs recommendations  Atrial fibrillation with rapid ventricular rate: Patient reentered rapid ventricular rate with her atrial fibrillation was restarted on IV diltiazem.  She continues on 5 mg/hr but is now at a normal rate. -Continue diltiazem as needed to maintain rate, appreciate cardiology's recommendations  Diet: N.p.o. Code: DNR Fluids: 150 mL's per hour normal saline DVT prophylaxis: On full anticoagulation with heparin  Dispo: Anticipated discharge in approximately when medically stable day(s).   Kathi Ludwig, MD 10/10/2018, 6:52 AM Pager: # [phone]

## 2018-10-10 NOTE — Progress Notes (Signed)
PHARMACY - ADULT TOTAL PARENTERAL NUTRITION CONSULT NOTE   Pharmacy Consult for TPN  Indication: Colonic Ileus  Patient Measurements: Height: 5\' 6"  (167.6 cm) Weight: 219 lb 12.8 oz (99.7 kg) IBW/kg (Calculated) : 59.3 TPN AdjBW (KG): 68.5 Body mass index is 35.48 kg/m.  Assessment: Patient is a 85 yo female with PMH significant for HTN, HLD, and previous SBO. Presents this admission with colonic ileus.  8/24- NGT placed Abdomen remains distended. NGT to suction (clamped for 30 minutes after medication administration). With NGT to suction most of the day pt likely not absorbing anything PO. Initiate TPN as pt is day 22 without adequate nutrition and hyponatremia has worsened.   GI: Colonic ileus - GI thinks this is secondary to electrolyte abnormalities Goal to maintain K of > 4 and Mag > 2 LBM 9/3 Emesis 100 mls Endo: No history of diabetes - will start sensitive SSI with TPN Insulin requirements in the past 24 hours: 0 Lytes: Na 127 (will add Na to TPN), K 4, Mag 1.8 (replaced), Ca 8.3 Renal: Scr 0.62 Pulm: [place] 3L Cards: A fib with RVR, on diltiazem drip and heparin infusion Hepatobil: will f/u labs tomorrow am Neuro: Alert and oriented ID: No issues at present  TPN Access: PICC to be placed today (10/10/18) TPN start date: 10/10/18  Nutritional Goals (per RD recommendation on 10/09/18): Kcal:  1700-1900 kcal Protein:  85-100 grams Fluid:  >/= 1.7 L/day  Goal TPN rate is 75 ml/hr (provides 90 g of protein, 236 g of dextrose, and 59 g of lipids which provides 1700 kCals per day, meeting 100% of patient needs)  Current Nutrition:   Plan:  Initiate TPN at 40 mL/hr. This TPN provides 48 g of protein, 125 g of dextrose, and 31.5 g of lipids which provides 902 kCals per day, meeting 50% of patient needs Electrolytes in TPN: Std, except will make Na content 150 meq/L Add MVI, trace elements, and famotidine to TPN Start sensitive SSI and adjust as needed Decreas IVMF NS to 125  ml/hr when TPN started Monitor TPN labs - patient at risk for refeeding syndrome   Alanda Slim, PharmD, [facility] Clinical Pharmacist Please see AMION for all Pharmacists' Contact Phone Numbers 10/10/2018, 12:31 PM

## 2018-10-10 NOTE — Progress Notes (Signed)
Late entry: patient had an episode of SOB on 3 l [place] and elevated heart rate in the 160s. Patient put on NRB and O@ saturation was 100. RR RN called, and MD on call notified. MD came at the bedside. New order received.

## 2018-10-10 NOTE — Significant Event (Addendum)
Rapid Response Event Note  Overview:Called d/t resp distress, HR-140s-160s. Time Called: 0407 Arrival Time: 0411 Event Type: Respiratory  Initial Focused Assessment: Pt sitting up in bed, alert and oriented, very anxious, using accessory muscles to breath. Pt denies chest pain but does c/o SOB. HR-156(Afib), BP-151/100, RR-24, Sp02-98% on NRB. Lungs diminished t/o with upper airway wheezing. ABD soft but distended.   Interventions: Pt pulled up in bed EKG NRB  2.5 metoprolol IV given x 1-HR decreased to 90-110s. Order to restart cardizem gtt.  After some time, pt breathing became less labored and pt weaned to 4L [place].  Plan of Care (if not transferred): Restart cardizem gtt. Continue to monitor pt. Call RRT if further assistance needed. Event Summary: Name of Physician Notified: Marianna Payment, MD at [phone]    at          [place]

## 2018-10-10 NOTE — Procedures (Signed)
    Progress Note   Subjective  Had two bowel movements last night while her son was visiting. Thinks she may be a little better, although she continues to have some shortness of breath.  Tolerating NG tube. Episode of symptomatic Afib with RVR this morning.  No family present at the time of my evaluation.    Objective  Vital signs in last 24 hours: Temp:  [97.8 F (36.6 C)] 97.8 F (36.6 C) (09/05 0730) Pulse Rate:  [36-93] 54 (09/05 0730) Resp:  [13-25] 17 (09/05 0730) BP: (132-154)/(57-92) 148/61 (09/05 0730) SpO2:  [96 %-100 %] 98 % (09/05 0730) Weight:  [99.7 kg] 99.7 kg (09/05 0500) Last BM Date: 10/09/18  General: Alert, elderly, well-developed, more comfortable today, NG tube present Abdomen:  Soft, protuberent. Less distended. Less tympany. No abdominal pain.  No bowel sounds. No rebound or guarding.  Extremities:  Without edema. Neurologic:  Alert and  oriented x4; grossly normal neurologically. Psych:  Alert and cooperative. Normal mood and affect.  Intake/Output from previous day: 09/04 0701 - 09/05 0700 In: 1948.3 [I.V.:1848.3; IV Piggyback:100] Out: 100 [Emesis/NG output:100] Intake/Output this shift: Total I/O In: 150 [NG/GT:150] Out: -   Lab Results: Recent Labs    10/09/18 0245 10/10/18 0635  WBC 5.7 8.0  HGB 12.0 12.1  HCT 37.0 37.4  PLT 302 307   BMET Recent Labs    10/09/18 0245 10/09/18 1713 10/10/18 0635  NA 126* 127* 127*  K 4.1 3.9 4.0  CL 97* 96* 96*  CO2 21* 22 20*  GLUCOSE 118* 106* 116*  BUN <5* <5* <5*  CREATININE 0.53 0.64 0.62  CALCIUM 8.0* 8.1* 8.3*    Studies/Results: No new abdominal films today.    Assessment & Recommendations   Peristent ileus in the setting of chronic colon distension and constipation worsened by electrolyte disturbances. I'm hoping that she is starting to turn the corner on the higher dose of pyridostigmine and with improvement in her hyponatremia.   Continue NG tube to intermittent suction.  Clear liquid tray limits the ability to interpret NG tube output. Parenteral feeds are most appropriate at this time.   Continue pyridostigmine to 90 mg TID in combination with standing Dulcolax suppositories BID. Continue IV Reglan.  Cross-sectional imaging tomorrow if she has not made additional progress.   I spoke with her son, Fraser Din, by phone at  [phone] (his cell phone, not home phone)     LOS: 23 days   Thornton Park MD 10/10/2018, 12:07 PM

## 2018-10-10 NOTE — Progress Notes (Signed)
ANTICOAGULATION CONSULT NOTE  Pharmacy Consult:  Heparin Indication: atrial fibrillation  Allergies  Allergen Reactions  . Diclofenac Sodium Anaphylaxis  . Ibuprofen Anaphylaxis  . Lisinopril     Fall hazard, dizzy  . Adhesive [Tape]   . Codeine Nausea Only  . Tizanidine     "knocked out cold"    Patient Measurements: Height: 5\' 6"  (167.6 cm) Weight: 219 lb 12.8 oz (99.7 kg) IBW/kg (Calculated) : 59.3 Heparin Dosing Weight: 80 kg  Vital Signs: Temp: 97.8 F (36.6 C) (09/05 0730) Temp Source: Oral (09/05 0730) BP: 148/61 (09/05 0730) Pulse Rate: 54 (09/05 0730)  Labs: Recent Labs    10/08/18 0243  10/09/18 0245 10/09/18 1713 10/10/18 0635  HGB  --   --  12.0  --  12.1  HCT  --   --  37.0  --  37.4  PLT  --   --  302  --  307  HEPARINUNFRC 0.60  --  0.73*  --  0.49  CREATININE 0.66   < > 0.53 0.64 0.62   < > = values in this interval not displayed.    Estimated Creatinine Clearance: 54.6 mL/min (by C-G formula based on SCr of 0.62 mg/dL).  Assessment: 64 YOF with new atrial fibrillation this admit. Received IV heparin 8/14>>8/18, then changed to Eliquis 5 mg PO BID. Pt now transitioned back to IV heparin with ileus and inability to take PO on 8/20.  Heparin level today is therapeutic at 0.49. H&H is stable at 12.1/37.4. plts are wnl at 307. Pt still unable to tolerate PO.    Goal of Therapy:  Heparin level 0.3-0.7 units/mL Monitor platelets by anticoagulation protocol: Yes   Plan:  -continue heparin infusion at 1050 units/hour -Daily heparin level and CBC - monitor s/sx of bleeding  -F/U transitioning back to Eliquis   Thank you,   Eddie Candle, PharmD PGY-1 Pharmacy Resident

## 2018-10-10 NOTE — Progress Notes (Signed)
Nutrition Follow-up  RD working remotely.  DOCUMENTATION CODES:   Not applicable  INTERVENTION:  TPN per Pharmacy.   Monitor magnesium, potassium, and phosphorus daily for at least 3 days, MD to replete as needed, as pt is at risk for refeeding syndrome given pt without adequate nutrition over the past 23 days.  NUTRITION DIAGNOSIS:   Increased nutrient needs related to acute illness as evidenced by estimated needs; ongiong  GOAL:   Patient will meet greater than or equal to 90% of their needs; to be met with TPN  MONITOR:   Weight trends, Labs, I & O's(TPN tolerance)  REASON FOR ASSESSMENT:   Consult New TPN/TNA  ASSESSMENT:   Patient with PMH significant for HTN, HLD, and previous SBO. Presents this admission with colonic ileus.  8/24 NGT placed  Pt with prolonged colonic ileus. Abdomen distended with NGT in place to suction (clamped for 30 minutes after medication administration). NGT output 100 ml. Noted pt previously on clear liquid diet with NGT to suction most of the day. Pt likely not absorbing PO. Hyponatremia worsening. Pt and family agreeable to parenteral nutrition. PICC to be placed today. Pt now NPO. RD to discontinue oral nutritional supplements.  Per Pharmacy, TPN to start at 40 ml/hr which provides 902 kcal (53% of kcal needs) and 48 grams of protein (56% of protein needs). Goal TPN to be 75 ml/hr which provide 1700 kcal (100% of needs) and 90 grams of protein.   Pt at risk for refeeding syndrome due to pt without adequate nutrition over the past 23 days. Recommend monitor magnesium, potassium, and phosphorus daily for at least 3 days, MD to replete as needed.  Labs and medications reviewed. Sodium low at 127.   Diet Order:   Diet Order            Diet NPO time specified  Diet effective now              EDUCATION NEEDS:   Not appropriate for education at this time  Skin:  Skin Assessment: Skin Integrity Issues: Skin Integrity Issues:: Other  (Comment) Other: MASD- buttocks, nose  Last BM:  9/2  Height:   Ht Readings from Last 1 Encounters:  09/15/2018 '5\' 6"'  (1.676 m)    Weight:   Wt Readings from Last 1 Encounters:  10/10/18 99.7 kg    Ideal Body Weight:  59.1 kg  BMI:  Body mass index is 35.48 kg/m.  Estimated Nutritional Needs:   Kcal:  1700-1900 kcal  Protein:  85-100 grams  Fluid:  >/= 1.7 L/day    Corrin Parker, MS, RD, LDN Pager # [phone] After hours/ weekend pager # [phone]

## 2018-10-11 LAB — COMPREHENSIVE METABOLIC PANEL
ALT: 17 U/L (ref 0–44)
AST: 17 U/L (ref 15–41)
Albumin: 2.4 g/dL — ABNORMAL LOW (ref 3.5–5.0)
Alkaline Phosphatase: 55 U/L (ref 38–126)
Anion gap: 7 (ref 5–15)
BUN: 5 mg/dL — ABNORMAL LOW (ref 8–23)
CO2: 23 mmol/L (ref 22–32)
Calcium: 8.2 mg/dL — ABNORMAL LOW (ref 8.9–10.3)
Chloride: 101 mmol/L (ref 98–111)
Creatinine, Ser: 0.64 mg/dL (ref 0.44–1.00)
GFR calc Af Amer: >60 mL/min (ref >60)
GFR calc non Af Amer: >60 mL/min (ref >60)
Glucose, Bld: 146 mg/dL — ABNORMAL HIGH (ref 70–99)
Potassium: 3.4 mmol/L — ABNORMAL LOW (ref 3.5–5.1)
Sodium: 131 mmol/L — ABNORMAL LOW (ref 135–145)
Total Bilirubin: 0.6 mg/dL (ref 0.3–1.2)
Total Protein: 5.2 g/dL — ABNORMAL LOW (ref 6.5–8.1)

## 2018-10-11 LAB — CBC
HCT: 36.5 % (ref 36.0–46.0)
Hemoglobin: 11.4 g/dL — ABNORMAL LOW (ref 12.0–15.0)
MCH: 29.9 pg (ref 26.0–34.0)
MCHC: 31.2 g/dL (ref 30.0–36.0)
MCV: 95.8 fL (ref 80.0–100.0)
Platelets: 290 10*3/uL (ref 150–400)
RBC: 3.81 MIL/uL — ABNORMAL LOW (ref 3.87–5.11)
RDW: 13 % (ref 11.5–15.5)
WBC: 9.3 10*3/uL (ref 4.0–10.5)
nRBC: 0 % (ref 0.0–0.2)

## 2018-10-11 LAB — MAGNESIUM: Magnesium: 2 mg/dL (ref 1.7–2.4)

## 2018-10-11 LAB — DIFFERENTIAL
Abs Immature Granulocytes: 0.04 10*3/uL (ref 0.00–0.07)
Basophils Absolute: 0 10*3/uL (ref 0.0–0.1)
Basophils Relative: 0 %
Eosinophils Absolute: 0.1 10*3/uL (ref 0.0–0.5)
Eosinophils Relative: 1 %
Immature Granulocytes: 0 %
Lymphocytes Relative: 4 %
Lymphs Abs: 0.4 10*3/uL — ABNORMAL LOW (ref 0.7–4.0)
Monocytes Absolute: 0.5 10*3/uL (ref 0.1–1.0)
Monocytes Relative: 6 %
Neutro Abs: 8.2 10*3/uL — ABNORMAL HIGH (ref 1.7–7.7)
Neutrophils Relative %: 89 %

## 2018-10-11 LAB — GLUCOSE, CAPILLARY
Glucose-Capillary: 134 mg/dL — ABNORMAL HIGH (ref 70–99)
Glucose-Capillary: 143 mg/dL — ABNORMAL HIGH (ref 70–99)
Glucose-Capillary: 140 mg/dL — ABNORMAL HIGH (ref 70–99)
Glucose-Capillary: 142 mg/dL — ABNORMAL HIGH (ref 70–99)
Glucose-Capillary: 143 mg/dL — ABNORMAL HIGH (ref 70–99)

## 2018-10-11 LAB — TRIGLYCERIDES: Triglycerides: 66 mg/dL (ref <150)

## 2018-10-11 LAB — PREALBUMIN: Prealbumin: 5.3 mg/dL — ABNORMAL LOW (ref 18–38)

## 2018-10-11 LAB — PHOSPHORUS: Phosphorus: 2.4 mg/dL — ABNORMAL LOW (ref 2.5–4.6)

## 2018-10-11 LAB — HEPARIN LEVEL (UNFRACTIONATED): Heparin Unfractionated: 0.37 IU/mL (ref 0.30–0.70)

## 2018-10-11 MED ORDER — POTASSIUM CHLORIDE 10 MEQ/50ML IV SOLN
10.0000 meq | INTRAVENOUS | Status: AC
  Administered 2018-10-11 (×4): 10 meq via INTRAVENOUS
  Filled 2018-10-11 (×4): qty 50

## 2018-10-11 MED ORDER — POTASSIUM PHOSPHATES 15 MMOLE/5ML IV SOLN
10.0000 mmol | Freq: Once | INTRAVENOUS | Status: AC
  Administered 2018-10-11: 10 mmol via INTRAVENOUS
  Filled 2018-10-11: qty 3.33

## 2018-10-11 MED ORDER — TRAVASOL 10 % IV SOLN
INTRAVENOUS | Status: DC
  Administered 2018-10-11: 18:00:00 via INTRAVENOUS
  Filled 2018-10-11: qty 900

## 2018-10-11 NOTE — Progress Notes (Signed)
ANTICOAGULATION CONSULT NOTE  Pharmacy Consult:  Heparin Indication: atrial fibrillation  Allergies  Allergen Reactions  . Diclofenac Sodium Anaphylaxis  . Ibuprofen Anaphylaxis  . Lisinopril     Fall hazard, dizzy  . Adhesive [Tape]   . Codeine Nausea Only  . Tizanidine     "knocked out cold"    Patient Measurements: Height: 5\' 6"  (167.6 cm) Weight: 220 lb 3.8 oz (99.9 kg) IBW/kg (Calculated) : 59.3 Heparin Dosing Weight: 80 kg  Vital Signs: Temp: 97.9 F (36.6 C) (09/06 0804) Temp Source: Axillary (09/06 0804) BP: 122/74 (09/06 0804) Pulse Rate: 80 (09/06 0804)  Labs: Recent Labs    10/09/18 0245  10/10/18 0635 10/10/18 1700 10/11/18 0535  HGB 12.0  --  12.1  --  11.4*  HCT 37.0  --  37.4  --  36.5  PLT 302  --  307  --  290  HEPARINUNFRC 0.73*  --  0.49  --  0.37  CREATININE 0.53   < > 0.62 0.52 0.64   < > = values in this interval not displayed.    Estimated Creatinine Clearance: 54.6 mL/min (by C-G formula based on SCr of 0.64 mg/dL).  Assessment: 75 YOF with new atrial fibrillation this admit. Received IV heparin 8/14>>8/18, then changed to Eliquis 5 mg PO BID. Pt now transitioned back to IV heparin with ileus and inability to take PO on 8/20.  Heparin level today is therapeutic at 0.37. H&H has slightly decreased to 11.4/36.5. plts are wnl at 290. Pt still unable to tolerate PO and is on TPN.   Goal of Therapy:  Heparin level 0.3-0.7 units/mL Monitor platelets by anticoagulation protocol: Yes   Plan:  -continue heparin infusion at 1050 units/hour -Daily heparin level and CBC - monitor s/sx of bleeding  -F/U oral intake for when to transition back to Eliquis   Thank you,   Eddie Candle, PharmD PGY-1 Pharmacy Resident

## 2018-10-11 NOTE — Progress Notes (Signed)
    Progress Note   Subjective  Tired this morning but "I might be a little better."  Tolerating NG tube. No family present at the time of my evaluation. Bedside nurse results a large bowel movement over night.    Objective  Vital signs in last 24 hours: Temp:  [97.4 F (36.3 C)-98 F (36.7 C)] 97.9 F (36.6 C) (09/06 0804) Pulse Rate:  [60-82] 80 (09/06 0804) Resp:  [13-20] 20 (09/06 0804) BP: (122-153)/(56-80) 122/74 (09/06 0804) SpO2:  [97 %-100 %] 100 % (09/06 0804) Weight:  [99.6 kg-99.9 kg] 99.9 kg (09/06 0343) Last BM Date: 10/10/18  General: Alert but sleepy, elderly, well-developed, more comfortable today, NG tube present Abdomen:  Soft, protuberent. Less distended. Less tympany. No abdominal pain.  No bowel sounds although I hear noises related to the NG tube. No rebound or guarding.  Extremities:  Without edema. Neurologic:  Alert and  oriented x4; grossly normal neurologically. Psych:  Alert and cooperative. Normal mood and affect.  Intake/Output from previous day: 09/05 0701 - 09/06 0700 In: 1136.3 [I.V.:806.3; NG/GT:330] Out: 750 [Urine:650; Emesis/NG output:100] Intake/Output this shift: No intake/output data recorded. But, the bedside nurse confirms one large BM occurred over night.   Lab Results: Recent Labs    10/09/18 0245 10/10/18 0635 10/11/18 0535  WBC 5.7 8.0 9.3  HGB 12.0 12.1 11.4*  HCT 37.0 37.4 36.5  PLT 302 307 290   BMET Recent Labs    10/10/18 0635 10/10/18 1700 10/11/18 0535  NA 127* 130* 131*  K 4.0 3.1* 3.4*  CL 96* 103 101  CO2 20* 20* 23  GLUCOSE 116* 99 146*  BUN <5* <5* 5*  CREATININE 0.62 0.52 0.64  CALCIUM 8.3* 7.0* 8.2*    Studies/Results: No new abdominal films today.  Chest x-ray: CHF with small bibasilar pleural effusions and atelectasis.    Assessment & Recommendations   Prolonged ileus in the setting of chronic colon distension and constipation worsened by electrolyte disturbances. She is showing some early  signs of improvement on the higher dose of pyridostigmine and with continued improvement in her electrolytes.   Continue NG tube to intermittent suction. Continue pyridostigmine to 90 mg TID in combination with standing Dulcolax suppositories BID. Continue IV Reglan.  Continue parenteral nutrition.   I spoke with her son, Fraser Din, by phone at  [phone].      LOS: 24 days   Thornton Park MD 10/11/2018, 8:47 AM

## 2018-10-11 NOTE — Progress Notes (Signed)
[place] NOTE   Pharmacy Consult for TPN  Indication: Colonic Ileus  Patient Measurements: Height: 5\' 6"  (167.6 cm) Weight: 220 lb 3.8 oz (99.9 kg) IBW/kg (Calculated) : 59.3 TPN AdjBW (KG): 68.5 Body mass index is 35.55 kg/m.  Assessment: Patient is a 85 yo female with PMH significant for HTN, HLD, and previous SBO. Presents this admission with colonic ileus.  8/24- NGT placed Abdomen remains distended. NGT to suction (clamped for 30 minutes after medication administration). With NGT to suction most of the day pt likely not absorbing anything PO. Initiate TPN as pt is day 22 without adequate nutrition and hyponatremia has worsened.   GI: Colonic ileus - GI thinks this is secondary to electrolyte abnormalities Goal to maintain K of > 4 and Mag > 2 LBM 9/5 Emesis 100 mls Prealbumin 5.3 Endo: CBGs < 150. No history of diabetes - sensitive SSI Insulin requirements in the past 24 hours: 2 units Lytes: Na 127>131 (incr'd Na in TPN), K 3.4 (will replace), Mag 2, Phos 2.4 Renal: Scr 0.62; UOP 0.3 ml/kg/hr Pulm: [place] 3L Cards: A fib with RVR, on diltiazem drip and heparin infusion Hepatobil: LFTs WNL, TGs 66 Neuro: Alert and oriented ID: No issues at present  TPN Access: PICC to be placed today (10/10/18) TPN start date: 10/10/18  Nutritional Goals (per RD recommendation on 10/09/18): Kcal:  1700-1900 kcal Protein:  85-100 grams Fluid:  >/= 1.7 L/day  Goal TPN rate is 75 ml/hr (provides 90 g of protein, 236 g of dextrose, and 59 g of lipids which provides 1700 kCals per day, meeting 100% of patient needs)  Current Nutrition:   Plan:  Increase TPN to 75 mL/hr. This TPN provides 90 g of protein, 236 g of dextrose, and 59 g of lipids which provides 1700 kCals per day, meeting 100% of patient needs Electrolytes in TPN: Std, except incr'd Na  Add MVI, trace elements, and famotidine to TPN Monitor sensitive SSI and adjust as needed Decrease IVMF  NS to 75 ml/hr when new TPN bag hung KCl 10 meq IV x 4 KPhos 10 mmol IV x 1 Monitor TPN labs - patient at risk for refeeding syndrome   Alanda Slim, PharmD, [facility] Clinical Pharmacist Please see AMION for all Pharmacists' Contact Phone Numbers 10/11/2018, 7:52 AM

## 2018-10-11 NOTE — Progress Notes (Signed)
Notified MD over concern of pt's increased labor breathing. She's asymptomatic and oxygen is above 95%.  New orders received.  Will continue to monitor pt closely.

## 2018-10-11 NOTE — Progress Notes (Signed)
Subjective: The patient was lying in her bed today upon entering the room.  Patient's son [name] was at bedside.  The patient's son are adamant about continuing aggressive therapy and are happy with the results thus far.  Patient's son continues to feel that with time his mother will improve.  Today [name] denies chest pain, or worsening abdominal pain.  Objective:  Vital signs in last 24 hours: Vitals:   10/10/18 2300 10/11/18 0343 10/11/18 0804 10/11/18 1200  BP: (!) 129/56 133/80 122/74 123/69  Pulse: 72 80 80 81  Resp: 14 13 20 14   Temp: (!) 97.4 F (36.3 C) (!) 97.4 F (36.3 C) 97.9 F (36.6 C)   TempSrc: Axillary Axillary Axillary   SpO2: 98% 97% 100% 97%  Weight:  99.9 kg    Height:       General: A/O x4, patients respiratory rate normal but her breathing appears labored and she is less interactive today, afebrile, nondiaphoretic HEENT: PEERL, EMO intact Cardio: Irregular rhythm, no mrg's  Pulmonary: CTA bilaterally, no wheezing or crackles  Abdomen: The degree of patient's abdominal distention is improved, is softer today but remains distended MSK: BLE nontender, mild +1 pitting edema of the ankles bilaterally  Psych: Appropriate affect, not depressed in appearance, engages well  Assessment/Plan:  Principal Problem:   Ileus ([name]) Active Problems:   Moderate aortic stenosis   Hyponatremia   Atrial fibrillation with RVR (HCC)   Pleural effusion   Pneumonia  Patient is a 85 year old female who was initially admitted for atrial fibrillation with RVR found to a have colonic ileus. She has remain hospitalized due to a prolonged, recurrent colonic ileus.  Gastroneurology has been guiding therapy and is assisting with managing the patient but unfortunately there is been minimal improvement to date despite advancing therapy.  On 10/10/2018 there appeared to be modest improvement with with regard to the patient's abdominal distention.  Given the patient's dependence desire to  continue aggressive therapy will initiate TPN for better electrolyte and macronutrient management.  A/P Prolonged colonic ileus: Patient continues to have marked dilation of the cecum which is unchanged from last imaging.  However, patient has improved symptomatically and she has improvement today on exam.  This is encouraging given the lack of improvement in the preceding weeks. -Continue pyridostigmine 90 mg per tube every 8 hours -Continue to replace electrolytes, per pharmacy based on TPN - Decreased normal saline since to have ML's an hour - Appreciate GIs continued recommendations  ?Volume overload: Patient has increased work of breathing, and pedal edema.  However, her vitals remain stable with normal oxygen saturation, heart rate(on dilt), blood pressure and respiratory rate.  I feel this is a complex issue likely due to multiple etiologies including increased volume, deconditioning, colonic expansion and prolonged immobility. We are currently cautious to initiate diuretic therapy due to concern for further electrolyte derangement in the setting of colonic ileus.  Once her electrolytes improve with TPN, we will attempt to diurese her.  Patient's admission weight of 96 kg is below her current weight of 99 kg. - Monitor electrolytes - Consider 20 mg of Lasix IV x1 dose if her respiratory status worsens  Atrial fibrillation with RVR: Patient continues to be in a normal rate today but remains in atrial fibrillation.  We will continue to monitor and adjust the IV diltiazem as indicated. - Continue diltiazem as needed dose adjusted per rate -Continue heparin per pharmacy  Dispo: Anticipated discharge in approximately 3-4 day(s).   [name],  Brandi Nails, MD 10/11/2018, 1:34 PM Pager: # [phone]

## 2018-10-12 ENCOUNTER — Inpatient Hospital Stay (HOSPITAL_COMMUNITY): Payer: Medicare Other

## 2018-10-12 DIAGNOSIS — E8770 Fluid overload, unspecified: Secondary | ICD-10-CM

## 2018-10-12 DIAGNOSIS — E46 Unspecified protein-calorie malnutrition: Secondary | ICD-10-CM

## 2018-10-12 LAB — DIFFERENTIAL
Abs Immature Granulocytes: 0.04 10*3/uL (ref 0.00–0.07)
Basophils Absolute: 0 10*3/uL (ref 0.0–0.1)
Basophils Relative: 0 %
Eosinophils Absolute: 0.2 10*3/uL (ref 0.0–0.5)
Eosinophils Relative: 2 %
Immature Granulocytes: 1 %
Lymphocytes Relative: 6 %
Lymphs Abs: 0.5 10*3/uL — ABNORMAL LOW (ref 0.7–4.0)
Monocytes Absolute: 0.6 10*3/uL (ref 0.1–1.0)
Monocytes Relative: 7 %
Neutro Abs: 6.9 10*3/uL (ref 1.7–7.7)
Neutrophils Relative %: 84 %

## 2018-10-12 LAB — GLUCOSE, CAPILLARY
Glucose-Capillary: 132 mg/dL — ABNORMAL HIGH (ref 70–99)
Glucose-Capillary: 134 mg/dL — ABNORMAL HIGH (ref 70–99)
Glucose-Capillary: 141 mg/dL — ABNORMAL HIGH (ref 70–99)
Glucose-Capillary: 148 mg/dL — ABNORMAL HIGH (ref 70–99)

## 2018-10-12 LAB — COMPREHENSIVE METABOLIC PANEL
ALT: 18 U/L (ref 0–44)
AST: 21 U/L (ref 15–41)
Albumin: 2.3 g/dL — ABNORMAL LOW (ref 3.5–5.0)
Alkaline Phosphatase: 57 U/L (ref 38–126)
Anion gap: 5 (ref 5–15)
BUN: 8 mg/dL (ref 8–23)
CO2: 25 mmol/L (ref 22–32)
Calcium: 8.5 mg/dL — ABNORMAL LOW (ref 8.9–10.3)
Chloride: 106 mmol/L (ref 98–111)
Creatinine, Ser: 0.62 mg/dL (ref 0.44–1.00)
GFR calc Af Amer: >60 mL/min (ref >60)
GFR calc non Af Amer: >60 mL/min (ref >60)
Glucose, Bld: 127 mg/dL — ABNORMAL HIGH (ref 70–99)
Potassium: 4.1 mmol/L (ref 3.5–5.1)
Sodium: 136 mmol/L (ref 135–145)
Total Bilirubin: 0.4 mg/dL (ref 0.3–1.2)
Total Protein: 5.3 g/dL — ABNORMAL LOW (ref 6.5–8.1)

## 2018-10-12 LAB — HEPARIN LEVEL (UNFRACTIONATED): Heparin Unfractionated: 0.29 IU/mL — ABNORMAL LOW (ref 0.30–0.70)

## 2018-10-12 LAB — CBC
HCT: 34.7 % — ABNORMAL LOW (ref 36.0–46.0)
Hemoglobin: 10.8 g/dL — ABNORMAL LOW (ref 12.0–15.0)
MCH: 30 pg (ref 26.0–34.0)
MCHC: 31.1 g/dL (ref 30.0–36.0)
MCV: 96.4 fL (ref 80.0–100.0)
Platelets: 285 10*3/uL (ref 150–400)
RBC: 3.6 MIL/uL — ABNORMAL LOW (ref 3.87–5.11)
RDW: 13.2 % (ref 11.5–15.5)
WBC: 8.2 10*3/uL (ref 4.0–10.5)
nRBC: 0 % (ref 0.0–0.2)

## 2018-10-12 LAB — MAGNESIUM: Magnesium: 1.9 mg/dL (ref 1.7–2.4)

## 2018-10-12 LAB — TRIGLYCERIDES: Triglycerides: 57 mg/dL (ref <150)

## 2018-10-12 LAB — PREALBUMIN: Prealbumin: <5 mg/dL — ABNORMAL LOW (ref 18–38)

## 2018-10-12 LAB — PHOSPHORUS: Phosphorus: 2.7 mg/dL (ref 2.5–4.6)

## 2018-10-12 MED ORDER — MAGNESIUM SULFATE 2 GM/50ML IV SOLN
2.0000 g | Freq: Once | INTRAVENOUS | Status: AC
  Administered 2018-10-12: 10:00:00 2 g via INTRAVENOUS
  Filled 2018-10-12: qty 50

## 2018-10-12 MED ORDER — FUROSEMIDE 10 MG/ML IJ SOLN
INTRAMUSCULAR | Status: AC
  Filled 2018-10-12: qty 2

## 2018-10-12 MED ORDER — TRAVASOL 10 % IV SOLN
INTRAVENOUS | Status: DC
  Filled 2018-10-12: qty 900

## 2018-10-12 MED ORDER — FUROSEMIDE 10 MG/ML IJ SOLN
20.0000 mg | Freq: Once | INTRAMUSCULAR | Status: AC
  Administered 2018-10-12: 16:00:00 20 mg via INTRAVENOUS

## 2018-11-05 NOTE — Progress Notes (Signed)
ANTICOAGULATION CONSULT NOTE  Pharmacy Consult:  Heparin Indication: atrial fibrillation  Allergies  Allergen Reactions  . Diclofenac Sodium Anaphylaxis  . Ibuprofen Anaphylaxis  . Lisinopril     Fall hazard, dizzy  . Adhesive [Tape]   . Codeine Nausea Only  . Tizanidine     "knocked out cold"    Patient Measurements: Height: 5\' 6"  (167.6 cm) Weight: 228 lb 13.4 oz (103.8 kg) IBW/kg (Calculated) : 59.3 Heparin Dosing Weight: 80 kg  Vital Signs: Temp: 97.5 F (36.4 C) (09/07 0402) Temp Source: Axillary (09/07 0402) BP: 115/73 (09/07 0402) Pulse Rate: 82 (09/07 0402)  Labs: Recent Labs    10/10/18 0635 10/10/18 1700 10/11/18 0535 11-04-2018 0500  HGB 12.1  --  11.4* 10.8*  HCT 37.4  --  36.5 34.7*  PLT 307  --  290 285  HEPARINUNFRC 0.49  --  0.37 0.29*  CREATININE 0.62 0.52 0.64 0.62    Estimated Creatinine Clearance: 55.8 mL/min (by C-G formula based on SCr of 0.62 mg/dL).  Assessment: 34 YOF with new atrial fibrillation this admit. Received IV heparin 8/14>>8/18, then changed to Eliquis 5 mg PO BID. Pt now transitioned back to IV heparin with ileus and inability to take PO on 8/20.  Heparin level today is sub-therapeutic at 0.29. H&H is trending down today at 10.8/34.7 but plts are wnl at 285 and per nursing there are no signs of bleeding. There were also no infusion issues per nursing. Pt still unable to tolerate PO and is on TPN.   Goal of Therapy:  Heparin level 0.3-0.7 units/mL Monitor platelets by anticoagulation protocol: Yes   Plan:  - Slightly increase heparin infusion to 1100 units/hour -Daily heparin level and CBC - monitor s/sx of bleeding  -F/U oral intake for when to transition back to Eliquis   Thank you,   Eddie Candle, PharmD PGY-1 Pharmacy Resident

## 2018-11-05 NOTE — Progress Notes (Signed)
10-13-2018 1615 Pt's son called nurse to room stating "mama is not responding to me."  Nurse went in to check on pt.  Pt was no longer responding.  Used yaunker to try and suction out what was in the back of pt's throat and noticed there no longer was a gag reflex.  RT was notified and Dr. Van Clines from teaching service was also notified.  RT is on here wat to assess pt and teaching services will be up to assess pt also.   Carney Corners

## 2018-11-05 NOTE — Progress Notes (Signed)
Nov 05, 2018 1649 Teaching services ordered Lasix 20mg  x1.  Lasix given per MD order. Carney Corners

## 2018-11-05 NOTE — Progress Notes (Addendum)
  Subjective:  Patient seen at bedside on rounds this AM. Patient says she is feeling a little better. Denies difficulty breathing, abdominal pain.   Objective:    Vital Signs (last 24 hours): Vitals:   10/13/18 0003 October 13, 2018 0402 10-13-2018 0835 10-13-2018 1200  BP: 135/69 115/73 117/85 138/74  Pulse: 72 82 85 84  Resp: 18 14  17   Temp: 97.8 F (36.6 C) (!) 97.5 F (36.4 C)  (!) 97.5 F (36.4 C)  TempSrc: Axillary Axillary  Oral  SpO2: 98% 96% 96% 93%  Weight:  103.8 kg    Height:        Physical Exam: General Alert and answers questions appropriately, no acute distress  Cardiac Regular rate and rhythm, no murmurs, rubs, or gallops  Abdominal Moderate abdominal distension, softer, continues to improve  Extremities BLE nontender, mild +1 pitting edema of ankles bilaterally     Assessment/Plan:   Principal Problem:   Ileus (HCC) Active Problems:   Moderate aortic stenosis   Hyponatremia   Atrial fibrillation with RVR (HCC)   Pleural effusion   Pneumonia  Patient is a 85 year old female initially admitted for A. fib with RVR, now hospitalized for colonic ileus.  # Colonic Ileus: Patient has had improvement in her electrolytes and patient has improved symptomatically with improvement of distention.  Abdominal x-ray shows markedly decreased bowel gas distention suggesting decompression of the cecum.  Stopping normal saline fluids to avoid volume overload, continue TPN - Continue pyridostigmine 90 mg per tube every 8 hours - Dulcolax suppository 10 mg twice daily - Continue MiraLAX 17 g twice daily per NG tube - Reglan 10 mg IV every 6 hours - TPN - Clamp NG tube today, can remove tomorrow if she tolerates well.  # Volume overload: Patient has increased work of breathing and lower extremity edema. She has been intravascularly dry and third spacing IV fluid due to hypoalbuminemia.  Patient vitals remained stable maintaining normal oxygen saturation and respiratory rate.   Plan is to stop IV fluids today, and mobilize her out of bed.   # A fib with RVR: Continue diltiazem and heparin.  DVT Ppx: On therapeutic heparin for A Fib. Diet: NPO, on TPN Dispo: Anticipated discharge pending clinical improvement  Jeanmarie Hubert, MD Oct 13, 2018, 1:07 PM Pager: [phone]

## 2018-11-05 NOTE — Discharge Summary (Addendum)
Name: [name] MRN: [id number] DOB: [birth year] 85 y.o.  Date of Admission: 09/28/2018 10:03 AM Date of Discharge: 2018-10-17 Attending Physician: Dr. Evette Doffing  Discharge Diagnosis: Principal Problem:   Ileus [facility]) Active Problems:   Moderate aortic stenosis   Hyponatremia   Atrial fibrillation with RVR (HCC)   Pleural effusion   Pneumonia   Cause of death: Respiratory failure Time of death: May 04, 1745 on 10/17/18  Disposition and follow-up:   Ms.[name] was discharged from [facility] in expired condition.    Hospital Course: # Atrial fibrillation with RVR: Patient presented to the emergency department with shortness of breath and was found to be in atrial fibrillation with RVR. CHADSVASC score of 6.  She was started on a diltiazem drip for rate control. Anticoagulation was initiated with heparin as she was unable to tolerate oral intake.  TTE on 8/14 with normal LVEF, no left atrial or right atrial enlargement, but moderate aortic stenosis.   # Colonic Ileus: Patient had a history of abdominal obstruction and ileus in 05-05-2014.  Prior to presentation, she had noticed increased abdominal bloating and pain in the past 1 month.  On presentation KUB was negative for obstruction. On 8/16, patient began having worsening epigastric pain and abdominal distention. NG tube was placed and electrolytes monitored/repleted as ileus was thought to be in part secondary to electrolyte abnormalities.  NG tube was removed when patient was on BiPAP (see below) and replaced shortly thereafter.  Treatment was continued with Dulcolax and MiraLAX and with persistent colonic distention a rectal tube was placed.    Patient was started on IV Reglan and with continued symptoms patient was started started on pyridostigmine (patient was not a candidate for neostigmine given atrial fibrillation) and dulcolax suppositories (after removal of rectal tube).  Patient had difficult to correct hyponatremia with  persistent values of approximately 122 despite treatment with normal saline fluids.  Adrenal insufficiency was considered but a.m. cortisol was within normal limits. As patients hyponatremia was likely contributory to ileus and patient was thought to be intravascularly volume depleted, fluid rate was titrated upwards as tolerated. With prolonged inability to take PO intake, patient was initiated on TPN. Patient experienced nonpitting peripheral edema 2/2 third spacing with increase in fluid rate but patient sodium improved and gradually increased up to 136 on October 17, 2018. Patient experienced significantly decreased abdominal distention and abdominal XR on 2018-10-17 demonstrated markedly decreased bowel gas distention.  Multiple discussions on goals of care were had with patient and son.  They were made aware of the challenging nature of patient's medical problems and the poor prognosis of her conditions.  Patient and son expressed desire to be aggressive in treatment.   # Pneumonia with acute respiratory failure:  On 8/16, patient was febrile to 101.6 and chest x-ray showed what could represent pneumonia versus atelectasis in the bilateral lower lung fields.  Patient experienced respiratory decompensation requiring patient be placed on BiPAP.  Patient was started on ceftriaxone and azithromycin due to concern for aspiration pneumonia in the setting of frequent emesis.  Blood and urine cultures were without evidence for infection.  By the following day patient was weaned down to 4 L nasal cannula and antibiotics were continued for a 5-day course.  By 8/23, patient was able to come off oxygen.  Patient developed upper airway wheezing and patient was treated with Proventil and DuoNeb PRNs.  On 2022/10/17 MD was paged by RN for decreased responsiveness and O2 saturation to the 70s. Patient  was placed on non-rebereather mask. Patient was evaluated by respiratory therapy but patient was not a candidate for BiPap given lack of  gag reflex/inability to protect airway. Chest XR demonstrated interval complete opacification of right hemithorax with associated mediastinal shift to the right which was consistent with an obstructing mucous plug. Patient was DNR/DNI so was not candidate for further invasive intervention.  Patient died at 60 on 10-30-18.  MD was notified and Dr. Sherry Ruffing spoke with patient's son.  Signed: Jeanmarie Hubert, MD 10/14/2018, 7:22 PM

## 2018-11-05 NOTE — Progress Notes (Signed)
PT Cancellation Note  Patient Details Name: JAZMYNNE BUDISH MRN: [id number] DOB: 1924-03-30   Cancelled Treatment:    Reason Eval/Treat Not Completed: Medical issues which prohibited therapy(sats 88% at rest with fluid overload per son)   Denice Paradise 2018-11-02, 2:46 PM [place] Pager:  [phone]  Office:  [phone]

## 2018-11-05 NOTE — Progress Notes (Signed)
    Progress Note   Subjective  Feeling better.  Tolerating NG tube. Two bowel movements noted in the last 24 hours. No family present at the time of my evaluation.     Objective  Vital signs in last 24 hours: Temp:  [97.5 F (36.4 C)-97.8 F (36.6 C)] 97.5 F (36.4 C) (09/07 0402) Pulse Rate:  [72-84] 82 (09/07 0402) Resp:  [14-18] 14 (09/07 0402) BP: (111-135)/(64-113) 115/73 (09/07 0402) SpO2:  [96 %-98 %] 96 % (09/07 0402) Weight:  [103.8 kg] 103.8 kg (09/07 0402) Last BM Date: 10/11/18  General: Alert,more comfortable today, NG tube present Abdomen:  Soft, protuberent. Less distended. Less tympany. No abdominal pain.  No bowel sounds although I hear noises related to the NG tube. No rebound or guarding.  Extremities:  Without edema. Neurologic:  Alert and  oriented x4; grossly normal neurologically. Psych:  Alert and cooperative. Normal mood and affect.   Lab Results: Recent Labs    10/10/18 0635 10/11/18 0535 28-Oct-2018 0500  WBC 8.0 9.3 8.2  HGB 12.1 11.4* 10.8*  HCT 37.4 36.5 34.7*  PLT 307 290 285   BMET Recent Labs    10/10/18 1700 10/11/18 0535 Oct 28, 2018 0500  NA 130* 131* 136  K 3.1* 3.4* 4.1  CL 103 101 106  CO2 20* 23 25  GLUCOSE 99 146* 127*  BUN <5* 5* 8  CREATININE 0.52 0.64 0.62  CALCIUM 7.0* 8.2* 8.5*    Studies/Results: No new abdominal films today.  Chest x-ray: CHF with small bibasilar pleural effusions and atelectasis.    Assessment & Recommendations   Prolonged ileus in the setting of chronic colon distension and constipation worsened by electrolyte disturbances. She is showing signs of improvement on the higher dose of pyridostigmine and with continued improvement in her electrolytes.   Abdominal films today. Consider NG tube clamping trial if abdominal films are improved. Continue pyridostigmine to 90 mg TID in combination with standing Dulcolax suppositories BID. Continue IV Reglan.  Continue parenteral nutrition.       LOS:  25 days   Thornton Park MD 10/28/2018, 10:02 AM

## 2018-11-05 NOTE — Progress Notes (Signed)
November 02, 2018 1747 RN pronounced death at 1747.  Son at bedside.  Teaching services notified. Carney Corners

## 2018-11-05 NOTE — Progress Notes (Signed)
PHARMACY - ADULT TOTAL PARENTERAL NUTRITION CONSULT NOTE   Pharmacy Consult for TPN  Indication: Colonic Ileus  Patient Measurements: Height: 5\' 6"  (167.6 cm) Weight: 228 lb 13.4 oz (103.8 kg) IBW/kg (Calculated) : 59.3 TPN AdjBW (KG): 68.5 Body mass index is 36.94 kg/m.  Assessment: Patient is a 85 yo female with PMH significant for HTN, HLD, and previous SBO. Presents this admission with colonic ileus.  8/24- NGT placed Abdomen remains distended. NGT to suction (clamped for 30 minutes after medication administration). With NGT to suction most of the day pt likely not absorbing anything PO. Initiate TPN as pt is day 22 without adequate nutrition and hyponatremia has worsened.   GI: Colonic ileus - GI thinks this is secondary to electrolyte abnormalities. Goal to maintain K of > 4 and Mag > 2 LBM 9/6 - on bisacodyl, Miralax, pyridostigmine  NGT output 400 mls Prealbumin 5.3 >> <5 Endo: CBGs < 150. No history of diabetes - sensitive SSI Insulin requirements in the past 24 hours: 6 units Lytes: Na 131 > 136 (incr'd Na in TPN), K 4.1 s/p replacement, Mag 1.9, Phos 2.7 s/p replacement Renal: Scr 0.62; UOP 0.4 ml/kg/hr, NS @ 75 ml/hr, net +2.5L Pulm: [place] 3L Cards: A fib with RVR, on diltiazem drip and heparin infusion Hepatobil: LFTs WNL, TGs 66 Neuro: Alert and oriented ID: No issues at present  TPN Access: PICC to be placed today (10/10/18) TPN start date: 10/10/18  Nutritional Goals (per RD recommendation on 10/09/18): Kcal:  1700-1900 kcal Protein:  85-100 grams Fluid:  >/= 1.7 L/day  Goal TPN rate is 75 ml/hr (provides 90 g of protein, 236 g of dextrose, and 59 g of lipids which provides 1700 kCals per day, meeting 100% of patient needs)  Current Nutrition:   Plan:  Continue TPN to 75 mL/hr. This TPN provides 90 g of protein, 236 g of dextrose, and 59 g of lipids which provides 1700 kCals per day, meeting 100% of patient needs Electrolytes in TPN: Std, except incr'd Na  Add  MVI, trace elements, and famotidine to TPN Monitor sensitive SSI and adjust as needed Continue IVMF NS at 75 ml/hr - watch volume and respiratory status Magnesium 2 g IV x1 Monitor TPN labs - patient at risk for refeeding syndrome   Thank you for involving pharmacy in this patient's care.  Renold Genta, PharmD, BCPS Clinical Pharmacist Clinical phone for 10/19/2018 until 3p is [phone] 2018/10/19 7:12 AM  **Pharmacist phone directory can be found on [email or web address] listed under [place]**

## 2018-11-05 NOTE — Progress Notes (Signed)
RT was called to patient's room due to patient's son calling to RN that patient had become unresponsive.  Per RN, patient's sats had dropped to 80s therefore RN placed on non-rebreather.  Upon arrival patient was still unresponsive with sats of 88%, with guppy breathing pattern noted.  Patient currently has NG tube placed therefore unable to have bipap placed.  MD paged by RN.  Sats continued to drop while in room.  Attempted to orally suction patient however patient does not have gag reflex.  Sats currently at 78%.  Will continue to monitor and wait for further instructions.

## 2018-11-05 NOTE — Progress Notes (Signed)
Paged by RN that patient was becoming less responsive and oxygen patient dropped down to the 70s, placed patient on nonrebreather and oxygen and now around 78%.  Went to evaluate at bedside and son was present.  He reports that patient he noticed the patient was steadily worsening throughout the day and now is not squeezing his hand. He was concerned about the amount of fluids that she had been receiving and reported that lasix had helped before.   PE: General: Ill appearing female, elderly, decreased responsiveness, NGT in place Cardiac: RRR, 1-2+ BL LE edema, non-pitting upper extremity edema Pulmonary: Increased work of breathing, diffuse rattling through out lungs Neuro: Not following commands, PERRLA  Overall patient has an unstable status. She has hypoalbuminemia due to malnutrition and has diffuse third spacing and pulmonary edema. Fluids were discontinued this morning in response to this. RT at bedside, discussed with RT who reported that given patients unresponsiveness, no gag reflex, and NGT placement BiPAP was not recommended.   Son was requesting IV lasix given improvement with lasix in the past. Discussed that this seems unlikely to help since she is intravascularly dry but son continued to request. BP and HR are stable at this time. Given her worsening respiratory status, hypoxia and mild pulmonary edema will trial a low dose of lasix and continue to monitor.   -Continue non-rebreather, BiPAP does not appear to be an option -Lasix 20 mg IV once -CXR

## 2018-11-05 DEATH — deceased

## 2019-12-11 IMAGING — DX ABDOMEN - 1 VIEW
2 series · 2 of 2 positions shown · non-contrast
Comparison: 10/04/2018 and prior radiographs

CLINICAL DATA: Abdominal pain and distension.

EXAM:
ABDOMEN - 1 VIEW

[abdomen kub (1 of 2)]
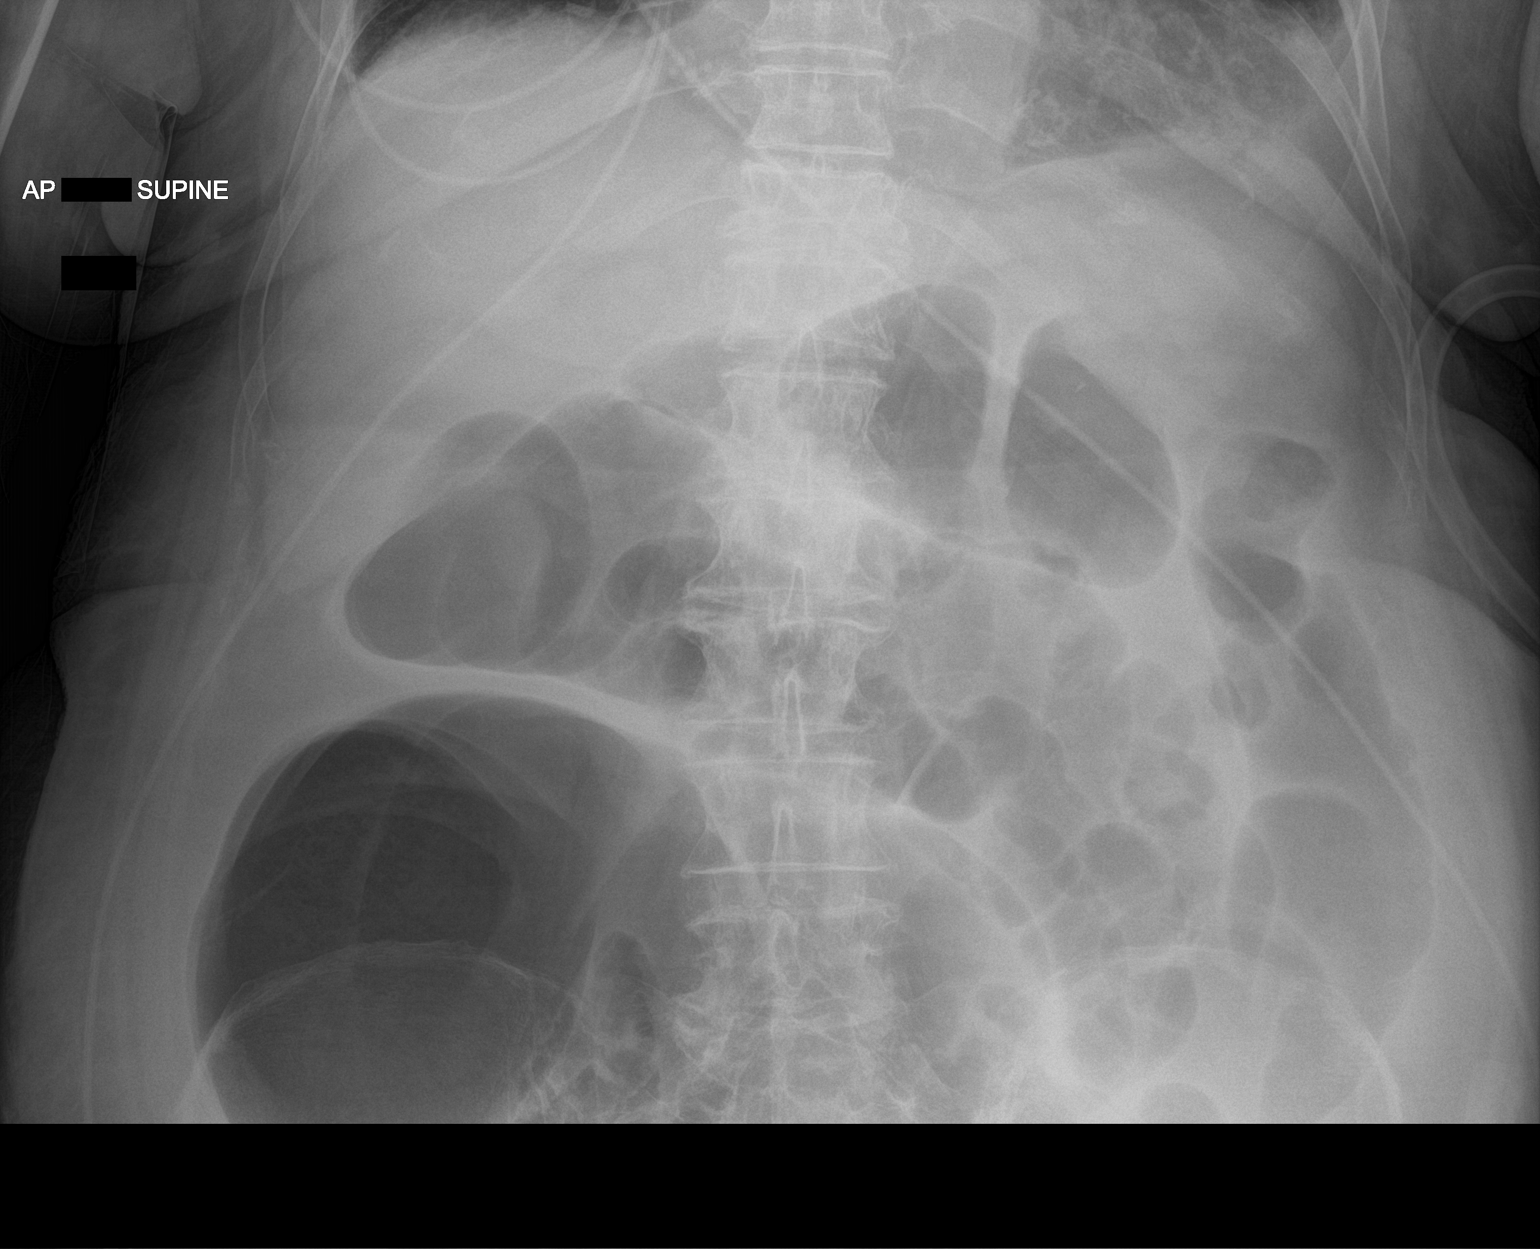

[abdomen kub (2 of 2)]
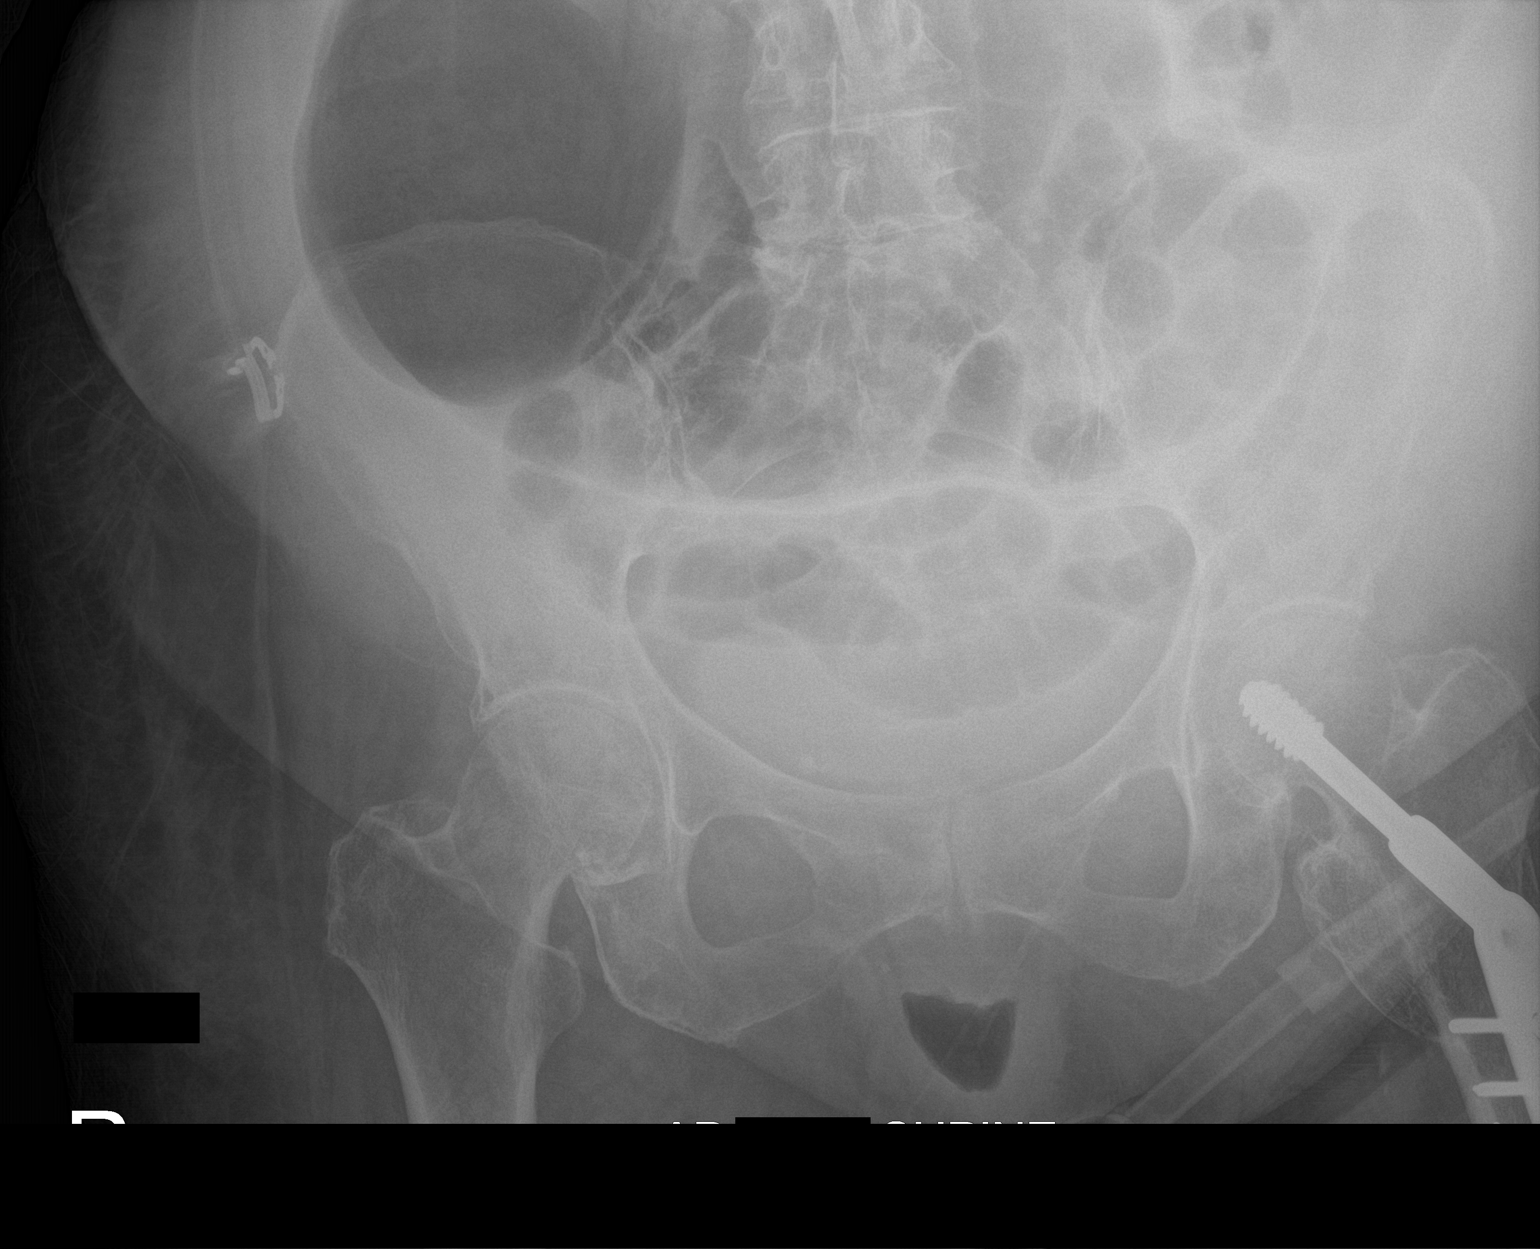

[2 of 2 positions shown; findings below may reference images not displayed]

FINDINGS: Gaseous distension of the colon is unchanged, greatest involving the
cecum.

Nondistended gas-filled loops of small bowel are present.

Little significant change since the study earlier today.
IMPRESSION: Unchanged appearance of the abdomen with gaseous colonic distension,
greatest involving the cecum.

## 2019-12-13 IMAGING — DX DG ABDOMEN 1V
1 series · 1 of 1 positions shown · non-contrast
Comparison: 10/04/2018

CLINICAL DATA: Abdominal pain and distention, ileus

EXAM:
ABDOMEN - 1 VIEW

[abdomen kub]
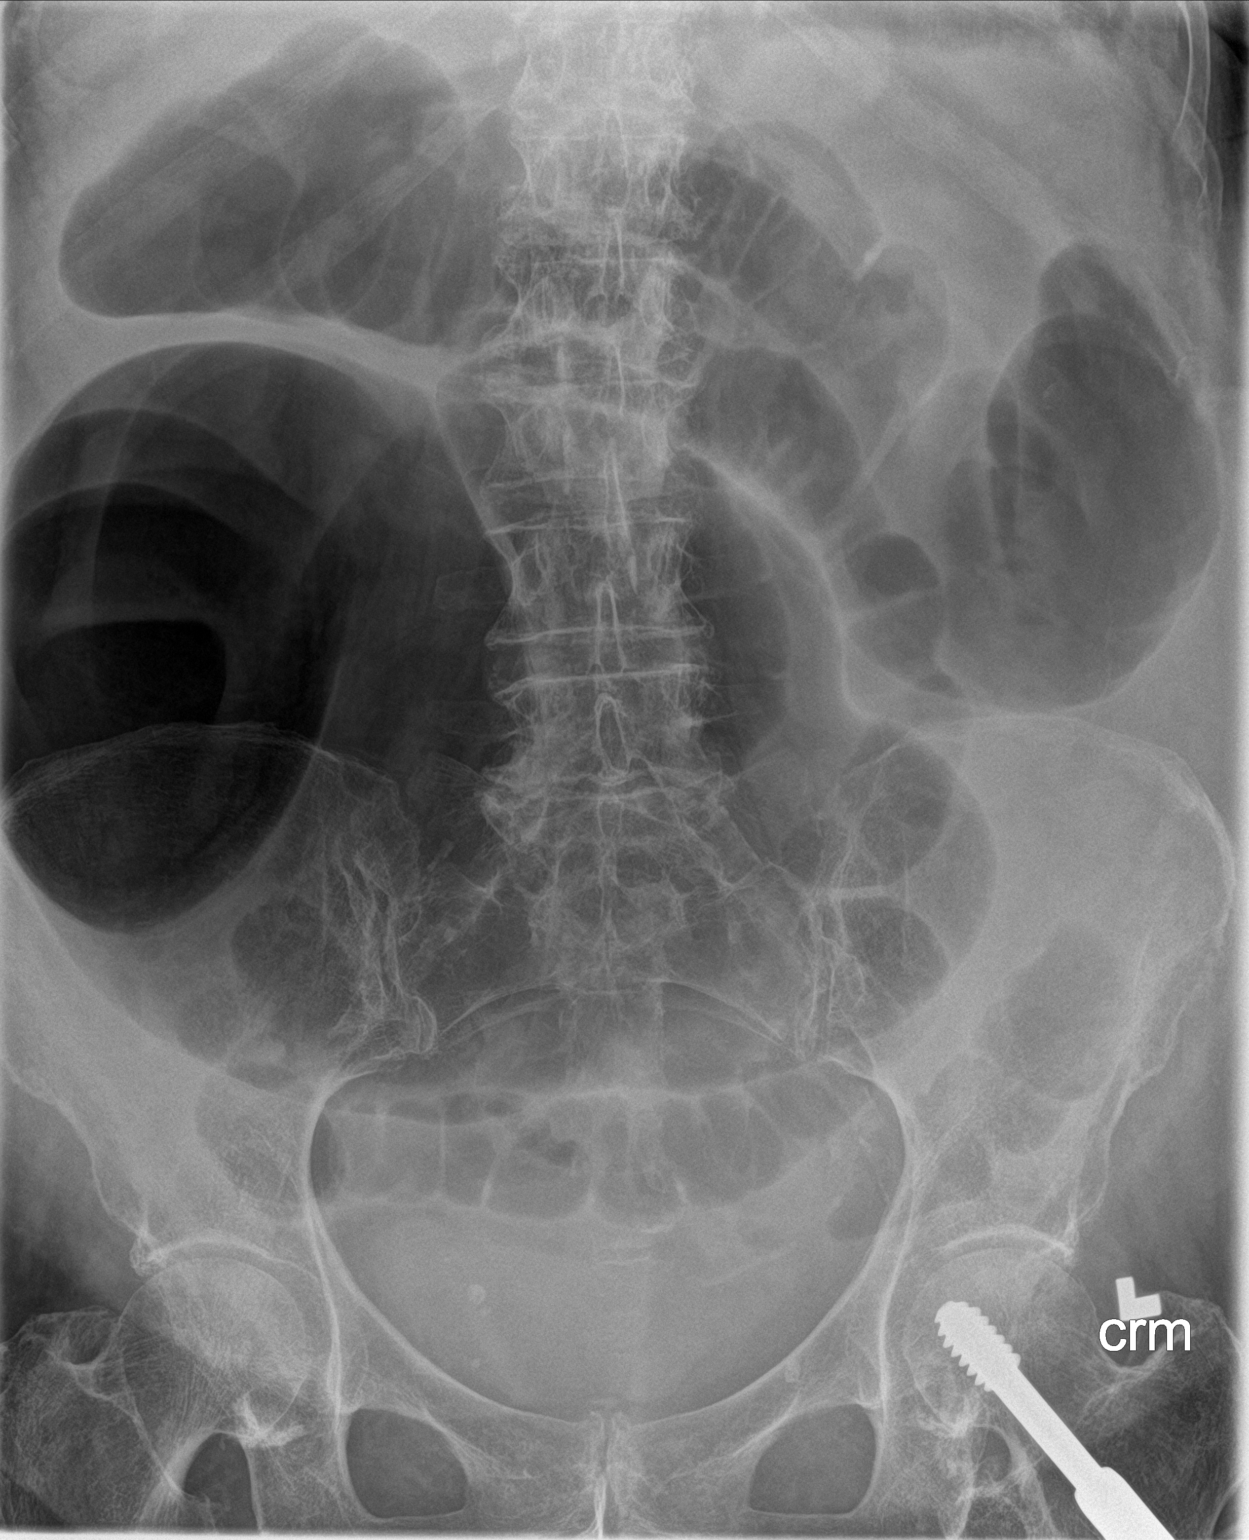

[1 of 1 positions shown; findings below may reference images not displayed]

FINDINGS: Colonic distention diffusely with marked gaseous distension of the
cecum across the midline measuring up to 18.9 cm transverse.

No significant small bowel distension.

No bowel wall thickening.

Bones demineralized with degenerative changes of lumbar spine.

Prior LEFT femoral ORIF.

No urinary tract calcifications.
IMPRESSION: Significant gaseous distention of colon especially cecum which
measures 18.9 cm transverse.

Findings called to [REDACTED] on 4E on 10/06/2018 at at 4440 hours.
